# Patient Record
Sex: Male | Born: 1950
Health system: Southern US, Community
[De-identification: ages and names within clinical notes are randomized; demographics above are authoritative.]

## PROBLEM LIST (undated history)

## (undated) DIAGNOSIS — E785 Hyperlipidemia, unspecified: Secondary | ICD-10-CM

## (undated) DIAGNOSIS — H269 Unspecified cataract: Secondary | ICD-10-CM

## (undated) DIAGNOSIS — S43439A Superior glenoid labrum lesion of unspecified shoulder, initial encounter: Secondary | ICD-10-CM

## (undated) DIAGNOSIS — H9190 Unspecified hearing loss, unspecified ear: Secondary | ICD-10-CM

## (undated) DIAGNOSIS — T7840XA Allergy, unspecified, initial encounter: Secondary | ICD-10-CM

## (undated) DIAGNOSIS — R011 Cardiac murmur, unspecified: Secondary | ICD-10-CM

## (undated) DIAGNOSIS — M1990 Unspecified osteoarthritis, unspecified site: Secondary | ICD-10-CM

## (undated) DIAGNOSIS — K219 Gastro-esophageal reflux disease without esophagitis: Secondary | ICD-10-CM

## (undated) DIAGNOSIS — Z87442 Personal history of urinary calculi: Secondary | ICD-10-CM

## (undated) DIAGNOSIS — M48061 Spinal stenosis, lumbar region without neurogenic claudication: Secondary | ICD-10-CM

## (undated) HISTORY — DX: Unspecified osteoarthritis, unspecified site: M19.90

## (undated) HISTORY — DX: Superior glenoid labrum lesion of unspecified shoulder, initial encounter: S43.439A

## (undated) HISTORY — PX: MOUTH SURGERY: SHX715

## (undated) HISTORY — DX: Spinal stenosis, lumbar region without neurogenic claudication: M48.061

## (undated) HISTORY — DX: Allergy, unspecified, initial encounter: T78.40XA

## (undated) HISTORY — DX: Unspecified hearing loss, unspecified ear: H91.90

## (undated) HISTORY — PX: TONSILLECTOMY: SUR1361

## (undated) HISTORY — DX: Unspecified cataract: H26.9

## (undated) HISTORY — PX: SPINE SURGERY: SHX786

## (undated) HISTORY — DX: Hyperlipidemia, unspecified: E78.5

## (undated) HISTORY — PX: COLONOSCOPY: SHX174

## (undated) HISTORY — DX: Cardiac murmur, unspecified: R01.1

## (undated) HISTORY — PX: HERNIA REPAIR: SHX51

---

## 1981-09-01 HISTORY — PX: VASECTOMY: SHX75

## 1998-09-01 HISTORY — PX: EYE SURGERY: SHX253

## 2015-01-17 ENCOUNTER — Ambulatory Visit: Payer: Self-pay | Admitting: Sports Medicine

## 2015-01-24 ENCOUNTER — Encounter: Payer: Self-pay | Admitting: Sports Medicine

## 2015-01-24 ENCOUNTER — Ambulatory Visit (INDEPENDENT_AMBULATORY_CARE_PROVIDER_SITE_OTHER): Payer: BLUE CROSS/BLUE SHIELD | Admitting: Sports Medicine

## 2015-01-24 VITALS — BP 136/88 | HR 49 | Ht 75.0 in | Wt 196.0 lb

## 2015-01-24 DIAGNOSIS — M79671 Pain in right foot: Secondary | ICD-10-CM | POA: Diagnosis not present

## 2015-01-24 DIAGNOSIS — M6528 Calcific tendinitis, other site: Secondary | ICD-10-CM

## 2015-01-24 MED ORDER — NITROGLYCERIN 0.2 MG/HR TD PT24: MEDICATED_PATCH | TRANSDERMAL | Status: DC

## 2015-01-24 NOTE — Assessment & Plan Note (Signed)
See PT instructions  NTG protocol Easy rehab REC with soft heel counter shoes and I need to build sports insole with lift

## 2015-01-24 NOTE — Progress Notes (Signed)
Patient ID: Patrick Wall, male   DOB: 16-Feb-1951, 64 y.o.   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  Rt heel pain started years ago First episode treated wth ECSWT and improved  Experimented w shoes and some make it worse Tried treatment with EPADs but no help Does not recall specific injury  Has flared after longer runs No problem on bike or swim (x flip turns)  Hearing loss Hx No chronic ills  Exam Muscular mail w hearing aid BP 136/88 mmHg  Pulse 49  Ht 6\' 3"  (1.905 m)  Wt 196 lb (88.905 kg)  BMI 24.50 kg/m2  Excellent quad str Excellent hip flexion and abduction str Good alignment  Rt leg short by 1.5 cms  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

## 2015-01-24 NOTE — Patient Instructions (Signed)
You have Haglund's deformity of RT heel Rt leg is 1.5 cm shorter Your RT Achilles is swollen but that is not as severe as the swelling around the Haglund  You need a trial on NTG patches Nitroglycerin Protocol   Apply 1/4 nitroglycerin patch to affected area daily.  Change position of patch within the affected area every 24 hours.  You may experience a headache during the first 1-2 weeks of using the patch, these should subside.  If you experience headaches after beginning nitroglycerin patch treatment, you may take your preferred over the counter pain reliever.  Another side effect of the nitroglycerin patch is skin irritation or rash related to patch adhesive.  Please notify our office if you develop more severe headaches or rash, and stop the patch.  Tendon healing with nitroglycerin patch may require 12 to 24 weeks depending on the extent of injury.  Men should not use if taking Viagra, Cialis, or Levitra.   Do not use if you have migraines or rosacea.   Stop running and look for shoe with soft heel cup Do heel raises with both legs - 3 sets of 8 and build up to 3 sets of 15  See me when you have the new shoe for an insole to correct leg length issue

## 2015-02-21 ENCOUNTER — Ambulatory Visit: Payer: Self-pay | Admitting: Sports Medicine

## 2015-02-26 ENCOUNTER — Encounter: Payer: Self-pay | Admitting: Family Medicine

## 2015-02-26 DIAGNOSIS — H9190 Unspecified hearing loss, unspecified ear: Secondary | ICD-10-CM | POA: Insufficient documentation

## 2015-02-26 DIAGNOSIS — I10 Essential (primary) hypertension: Secondary | ICD-10-CM | POA: Insufficient documentation

## 2015-02-26 DIAGNOSIS — E785 Hyperlipidemia, unspecified: Secondary | ICD-10-CM | POA: Insufficient documentation

## 2015-03-20 ENCOUNTER — Ambulatory Visit (INDEPENDENT_AMBULATORY_CARE_PROVIDER_SITE_OTHER): Payer: BLUE CROSS/BLUE SHIELD | Admitting: Sports Medicine

## 2015-03-20 ENCOUNTER — Encounter: Payer: Self-pay | Admitting: Sports Medicine

## 2015-03-20 VITALS — BP 135/82 | HR 45 | Ht 75.0 in | Wt 196.0 lb

## 2015-03-20 DIAGNOSIS — M6528 Calcific tendinitis, other site: Secondary | ICD-10-CM

## 2015-03-20 NOTE — Progress Notes (Signed)
Subjective:     Patient ID: Patrick Wall, male   DOB: 1951-02-01, 64 y.o.   MRN: [id number]  HPI Patrick Wall is a 64 yo male presenting for follow up of achilles tendonitis. At last appointment 6 weeks ago, he was given rehab exercises, a sports orthotic with lift, and started on nitroglycerin protocol. His pain and swelling at his heel have improved, but he has also not been running in the past 6 weeks. He is typically very active and would like to get back to running.   Pain level now is mild - usually 0 at rest;  1 to 2 with normal activity.  He found good soft shoes which allow fit of his sports insoles.  NTG caused on side-effects and he had less pain p starting.    Review of Systems  Per HPI.     Objective:   Physical Exam Gen: NAD/ muscular male  BP 135/82 mmHg  Pulse 45  Ht 6\' 3"  (1.905 m)  Wt 196 lb (88.905 kg)  BMI 24.50 kg/m2   MSK: Right foot: Haglund deformity on medial right heel. No swelling noted. No tenderness to palpation. Good strength with inversion, eversion, able now to do a 1 leg heel raise on RT - he could not do any of these last visit.  No pain on 2 leg heel raise.  US There is less doppler flow;  AT at insertion is 0.68 and was 0.77; less hypoechoic change at bursa.  Calcifications at distal tendon and around Haglund as before.  AT looks more fibrillary at distal insertion.    Assessment:     Achilles Tendinopathy and Haglund RT    Plan:     We will cont current Tx plan and reck 6 weeks

## 2015-03-20 NOTE — Assessment & Plan Note (Signed)
Less pain and better strength clinically on exam  US looks improved as well  Cont NTG protocol for another 6 wks  Increase exercise intensity for AT  Reck 6 wks

## 2015-03-26 ENCOUNTER — Encounter: Payer: Self-pay | Admitting: Family Medicine

## 2015-03-26 ENCOUNTER — Ambulatory Visit: Payer: BLUE CROSS/BLUE SHIELD | Admitting: Family Medicine

## 2015-03-26 ENCOUNTER — Ambulatory Visit (INDEPENDENT_AMBULATORY_CARE_PROVIDER_SITE_OTHER): Payer: BLUE CROSS/BLUE SHIELD | Admitting: Family Medicine

## 2015-03-26 VITALS — BP 110/70 | HR 58 | Temp 98.0°F | Resp 12 | Ht 75.5 in | Wt 201.0 lb

## 2015-03-26 DIAGNOSIS — E785 Hyperlipidemia, unspecified: Secondary | ICD-10-CM

## 2015-03-26 DIAGNOSIS — S43439A Superior glenoid labrum lesion of unspecified shoulder, initial encounter: Secondary | ICD-10-CM | POA: Insufficient documentation

## 2015-03-26 NOTE — Progress Notes (Signed)
Subjective:    Patient ID: Patrick Wall, male    DOB: 11/27/1950, 64 y.o.   MRN: [id number]  HPI Patrick Wall is a very pleasant 64 year old Caucasian male here today to establish care. He has a history of hyperlipidemia. Patient is very active. He runs triathlons. His most recent lab work reveals an HDL cholesterol of 92 and an LDL cholesterol of 80. He denies any myalgias or right upper quadrant pain. Risk factors for heart disease include family history of premature cardiovascular disease in his paternal aunt and possibly his father. He also has age greater than 63. Given this his goal LDL cholesterol be less than 130. He has not tried stopping the medication to see if he can control his cholesterol without medication. Colonoscopy was performed 2 years ago. He is due for prostate exam in October. He has had Pneumovax 23, the shingles vaccine, and a tetanus shot. The remainder of his preventative care is up-to-date. Patient has a chronically elevated AST ranging between 41 and 43 that has not been worked up any further. This is likely due to personal variation from standard deviation or possibly muscle breakdown secondary to his vigorous exercise schedule. At the present time the patient is not interested in performing viral hepatitis screening or right upper quadrant ultrasound which I believe is appropriate. He also has a slightly elevated blood sugar between 100-103. Past Medical History  Diagnosis Date  . Hyperlipidemia   . Hearing loss   . Labral tear of shoulder    Past Surgical History  Procedure Laterality Date  . Eye surgery  64      lasic  . Vasectomy  1983   Current Outpatient Prescriptions on File Prior to Visit  Medication Sig Dispense Refill  . aspirin 81 MG tablet Take 81 mg by mouth daily.    Marland Kitchen atorvastatin (LIPITOR) 40 MG tablet Take 40 mg by mouth daily.    . cholecalciferol (VITAMIN D) 1000 UNITS tablet Take 1,000 Units by mouth daily.    . Multiple Vitamin (MULTIVITAMIN)  capsule Take 1 capsule by mouth daily.    . nitroGLYCERIN (NITRODUR - DOSED IN MG/24 HR) 0.2 mg/hr patch Place 1/4 patch on affected area daily 30 patch 1   No current facility-administered medications on file prior to visit.   Allergies  Allergen Reactions  . Sulfa Antibiotics    History   Social History  . Marital Status: Married    Spouse Name: N/A  . Number of Children: N/A  . Years of Education: N/A   Occupational History  . Not on file.   Social History Main Topics  . Smoking status: Never Smoker   . Smokeless tobacco: Not on file  . Alcohol Use: 5.4 oz/week    5 Glasses of wine, 4 Cans of beer, 0 Standard drinks or equivalent per week  . Drug Use: No  . Sexual Activity: Yes    Birth Control/ Protection: Surgical     Comment: vasectomy   Other Topics Concern  . Not on file   Social History Narrative   Family History  Problem Relation Age of Onset  . Cancer Mother     skin  . Heart disease Mother   . Vision loss Mother   . Early death Father   . Arthritis Maternal Grandmother   . Cancer Maternal Grandmother   . Hyperlipidemia Maternal Grandmother   . Hypertension Maternal Grandmother   . Cancer Maternal Grandfather   . Heart disease Paternal Grandfather   . Stroke  Paternal Grandfather       Review of Systems  All other systems reviewed and are negative.      Objective:   Physical Exam  Constitutional: He appears well-developed and well-nourished.  Neck: Neck supple. No JVD present. No thyromegaly present.  Cardiovascular: Normal rate, regular rhythm, normal heart sounds and intact distal pulses.  Exam reveals no gallop and no friction rub.   No murmur heard. Pulmonary/Chest: Effort normal and breath sounds normal. No respiratory distress. He has no wheezes. He has no rales. He exhibits no tenderness.  Abdominal: Soft. Bowel sounds are normal. He exhibits no distension and no mass. There is no tenderness. There is no rebound and no guarding.    Musculoskeletal: He exhibits no edema.  Lymphadenopathy:    He has no cervical adenopathy.  Skin: No rash noted.  Vitals reviewed.         Assessment & Plan:  HLD (hyperlipidemia)  Patient has significant bradycardia but this is related to his vigorous aerobic exercise regimen. The remainder of his physical exam is normal. I would like the patient to temporarily discontinue Lipitor and in 3 months check a fasting lipid panel at his complete physical exam. His LDL cholesterol remains less than 130, I would not continue statin medication. If LDL cholesterol rises greater than 130 I would resume statin medication. The remainder of his preventative care is up-to-date at the present time

## 2015-05-01 ENCOUNTER — Encounter: Payer: Self-pay | Admitting: Sports Medicine

## 2015-05-01 ENCOUNTER — Ambulatory Visit (INDEPENDENT_AMBULATORY_CARE_PROVIDER_SITE_OTHER): Payer: BLUE CROSS/BLUE SHIELD | Admitting: Sports Medicine

## 2015-05-01 VITALS — BP 133/80 | Ht 75.0 in | Wt 196.0 lb

## 2015-05-01 DIAGNOSIS — M6528 Calcific tendinitis, other site: Secondary | ICD-10-CM | POA: Diagnosis not present

## 2015-05-01 NOTE — Progress Notes (Signed)
  Patrick Wall - 64 y.o. male MRN [id number]  Date of birth: [birth year]   Patrick Wall is a 64 y.o. male who presents today for calcific right achilles. He has been receiving NTG patches for 12 weeks. He has been given a wedge heel lift, compression sleeve, and sports orthotic. He has Haglund's changes.  He has been using 1/4 nitro patch but will increase it to a 1/2 after racing or exercise. He has noticed improvement since when he first started receiving care but not much change since July.  He has taken back his training since being seen for his achilles pain. On Sunday he raced in a 10K at 10 minute miles. He denies any pain during the race and felt a discomfort afterwards. He would like to start training for a 1/2 Iron[employer] that starts in 7 weeks.  He has been biking and swimming still.  He denies any prior injury or surgery to his right foot.     PMHx -  reviewed.  Contributory factors include: HLD, HTN, hearing loss PSHx - reviewed.   FHx - reviewed.   Medications - NTG patches, lipitor   ROS Per HPI   Exam:  Filed Vitals:   05/01/15 1535  BP: 133/80   Gen: NAD Cardiorespiratory - Normal respiratory effort/rate.   Foot Exam:  Laterality: right Appearance: no erythema or ecchymosis  Haglund deformity on medial right heel Nitro patch in place  Tenderness: none Range of Motion: normal active and passive  Neurovascularly intact: intact Strength:  Dorsiflexion: 5/5 Plantarflexion: 5/5 Inversion: 5/5 Eversion: 5/5   Imaging:  Limites [place]: right achilles   Achilles without tear or effusion with is an improvement from the scan in May but not much change from the scan in July. There is still neovascularization at the medial Haglund deformity and just proximal to the insertion of the achilles. These findings were observed on the scan in July.  Finding consistent with a healing calcific achilles tendon.   Calcific Achilles tendinitis Pain improved since May but not much change since July  in terms of US findings or discomfort.  Have to be optimistic as he isn't having much pain since running a 10K on Sunday  - will continue conservative management with nitro protocol  - he will start 1 run (~3 miles) over the next 2-3 weeks.  - he will increase to 2 runs per week 2-3 weeks prior to his Ironman  - he will f/u in three months, if there is no improvement then could consider pursing surgery

## 2015-05-01 NOTE — Assessment & Plan Note (Addendum)
Pain improved since May but not much change since July in terms of US findings or discomfort.  Have to be optimistic as he isn't having much pain since running a 10K on Sunday  - will continue conservative management with nitro protocol  - he will start 1 run (~3 miles) over the next 2-3 weeks.  - he will increase to 2 runs per week 2-3 weeks prior to his [employer]  - he will f/u in three months, if there is no improvement then could consider pursing surgery

## 2015-05-21 ENCOUNTER — Other Ambulatory Visit: Payer: Self-pay | Admitting: *Deleted

## 2015-05-21 MED ORDER — NITROGLYCERIN 0.2 MG/HR TD PT24: MEDICATED_PATCH | TRANSDERMAL | Status: DC

## 2015-06-26 ENCOUNTER — Other Ambulatory Visit: Payer: Self-pay | Admitting: Family Medicine

## 2015-06-26 ENCOUNTER — Other Ambulatory Visit: Payer: BLUE CROSS/BLUE SHIELD

## 2015-06-26 ENCOUNTER — Encounter: Payer: Self-pay | Admitting: Family Medicine

## 2015-06-26 ENCOUNTER — Ambulatory Visit (INDEPENDENT_AMBULATORY_CARE_PROVIDER_SITE_OTHER): Payer: BLUE CROSS/BLUE SHIELD | Admitting: Family Medicine

## 2015-06-26 VITALS — BP 100/68 | HR 56 | Temp 97.5°F | Resp 14 | Ht 75.0 in | Wt 205.0 lb

## 2015-06-26 DIAGNOSIS — E785 Hyperlipidemia, unspecified: Secondary | ICD-10-CM | POA: Diagnosis not present

## 2015-06-26 DIAGNOSIS — Z0000 Encounter for general adult medical examination without abnormal findings: Secondary | ICD-10-CM

## 2015-06-26 DIAGNOSIS — I10 Essential (primary) hypertension: Secondary | ICD-10-CM

## 2015-06-26 LAB — COMPLETE METABOLIC PANEL WITH GFR
ALBUMIN: 3.9 g/dL (ref 3.6–5.1)
ALK PHOS: 35 U/L — AB (ref 40–115)
ALT: 31 U/L (ref 9–46)
AST: 50 U/L — AB (ref 10–35)
BILIRUBIN TOTAL: 0.9 mg/dL (ref 0.2–1.2)
BUN: 15 mg/dL (ref 7–25)
CALCIUM: 9.1 mg/dL (ref 8.6–10.3)
CO2: 30 mmol/L (ref 20–31)
CREATININE: 0.97 mg/dL (ref 0.70–1.25)
Chloride: 104 mmol/L (ref 98–110)
GFR, Est African American: >89 mL/min (ref >=60)
GFR, Est Non African American: 83 mL/min (ref >=60)
GLUCOSE: 100 mg/dL — AB (ref 70–99)
Potassium: 4.5 mmol/L (ref 3.5–5.3)
Sodium: 139 mmol/L (ref 135–146)
TOTAL PROTEIN: 6.4 g/dL (ref 6.1–8.1)

## 2015-06-26 LAB — LIPID PANEL
CHOL/HDL RATIO: 2.2 ratio (ref <=5.0)
CHOLESTEROL: 196 mg/dL (ref 125–200)
HDL: 91 mg/dL (ref >=40)
LDL Cholesterol: 93 mg/dL (ref <130)
Triglycerides: 60 mg/dL (ref <150)
VLDL: 12 mg/dL (ref <30)

## 2015-06-26 LAB — CBC WITH DIFFERENTIAL/PLATELET
Basophils Absolute: 0 10*3/uL (ref 0.0–0.1)
Basophils Relative: 0 % (ref 0–1)
Eosinophils Absolute: 0.1 10*3/uL (ref 0.0–0.7)
Eosinophils Relative: 3 % (ref 0–5)
HEMATOCRIT: 41.4 % (ref 39.0–52.0)
HEMOGLOBIN: 14 g/dL (ref 13.0–17.0)
LYMPHS PCT: 44 % (ref 12–46)
Lymphs Abs: 1.2 10*3/uL (ref 0.7–4.0)
MCH: 31 pg (ref 26.0–34.0)
MCHC: 33.8 g/dL (ref 30.0–36.0)
MCV: 91.6 fL (ref 78.0–100.0)
MONO ABS: 0.4 10*3/uL (ref 0.1–1.0)
MONOS PCT: 13 % — AB (ref 3–12)
MPV: 10.8 fL (ref 8.6–12.4)
NEUTROS ABS: 1.1 10*3/uL — AB (ref 1.7–7.7)
Neutrophils Relative %: 40 % — ABNORMAL LOW (ref 43–77)
Platelets: 155 10*3/uL (ref 150–400)
RBC: 4.52 MIL/uL (ref 4.22–5.81)
RDW: 13.8 % (ref 11.5–15.5)
WBC: 2.7 10*3/uL — ABNORMAL LOW (ref 4.0–10.5)

## 2015-06-26 NOTE — Progress Notes (Signed)
Subjective:    Patient ID: Patrick Wall, male    DOB: 06-04-1951, 64 y.o.   MRN: [id number]  HPI 03/26/15 Patrick Wall is a very pleasant 64 year old Caucasian male here today to establish care. He has a history of hyperlipidemia. Patient is very active. He runs triathlons. His most recent lab work reveals an HDL cholesterol of 92 and an LDL cholesterol of 80. He denies any myalgias or right upper quadrant pain. Risk factors for heart disease include family history of premature cardiovascular disease in his paternal aunt and possibly his father. He also has age greater than 3345. Given this his goal LDL cholesterol be less than 130. He has not tried stopping the medication to see if he can control his cholesterol without medication. Colonoscopy was performed 2 years ago. He is due for prostate exam in October. He has had Pneumovax 23, the shingles vaccine, and a tetanus shot. The remainder of his preventative care is up-to-date. Patient has a chronically elevated AST ranging between 41 and 43 that has not been worked up any further. This is likely due to personal variation from standard deviation or possibly muscle breakdown secondary to his vigorous exercise schedule. At the present time the patient is not interested in performing viral hepatitis screening or right upper quadrant ultrasound which I believe is appropriate. He also has a slightly elevated blood sugar between 100-103.  AT that time, my plan was: Patient has significant bradycardia but this is related to his vigorous aerobic exercise regimen. The remainder of his physical exam is normal. I would like the patient to temporarily discontinue Lipitor and in 3 months check a fasting lipid panel at his complete physical exam. His LDL cholesterol remains less than 130, I would not continue statin medication. If LDL cholesterol rises greater than 130 I would resume statin medication. The remainder of his preventative care is up-to-date at the present  time.  06/26/15 Here for follow up.  Last colonoscopy was in 2014 and is not due again until 2024. He is due for prostate exam today. Flu shot is up-to-date. Patient is not due for Pneumovax 23 again until 2018.  He will be due for Prevnar 13 at age 64. Shingles vaccines up-to-date. Tetanus vaccines up-to-date. Patient had lab work obtained this morning but is not back yet Past Medical History  Diagnosis Date  . Hyperlipidemia   . Hearing loss   . Labral tear of shoulder    Past Surgical History  Procedure Laterality Date  . Eye surgery  2000    lasic  . Vasectomy  1983   Current Outpatient Prescriptions on File Prior to Visit  Medication Sig Dispense Refill  . aspirin 81 MG tablet Take 81 mg by mouth daily.    Marland Kitchen. atorvastatin (LIPITOR) 40 MG tablet Take 40 mg by mouth daily.    . cholecalciferol (VITAMIN D) 1000 UNITS tablet Take 1,000 Units by mouth daily.    . Multiple Vitamin (MULTIVITAMIN) capsule Take 1 capsule by mouth daily.    . nitroGLYCERIN (NITRODUR - DOSED IN MG/24 HR) 0.2 mg/hr patch Place 1/4 to 1/2 patch on affected area daily 30 patch 2  . penicillin v potassium (VEETID) 500 MG tablet   0   No current facility-administered medications on file prior to visit.   Allergies  Allergen Reactions  . Sulfa Antibiotics    Social History   Social History  . Marital Status: Married    Spouse Name: N/A  . Number of Children: N/A  .  Years of Education: N/A   Occupational History  . Not on file.   Social History Main Topics  . Smoking status: Never Smoker   . Smokeless tobacco: Not on file  . Alcohol Use: 5.4 oz/week    5 Glasses of wine, 4 Cans of beer, 0 Standard drinks or equivalent per week  . Drug Use: No  . Sexual Activity: Yes    Birth Control/ Protection: Surgical     Comment: vasectomy   Other Topics Concern  . Not on file   Social History Narrative   Family History  Problem Relation Age of Onset  . Cancer Mother     skin  . Heart disease Mother    . Vision loss Mother   . Early death Father   . Arthritis Maternal Grandmother   . Cancer Maternal Grandmother   . Hyperlipidemia Maternal Grandmother   . Hypertension Maternal Grandmother   . Cancer Maternal Grandfather   . Heart disease Paternal Grandfather   . Stroke Paternal Grandfather       Review of Systems  All other systems reviewed and are negative.      Objective:   Physical Exam  Constitutional: He is oriented to person, place, and time. He appears well-developed and well-nourished. No distress.  HENT:  Head: Normocephalic and atraumatic.  Right Ear: External ear normal.  Left Ear: External ear normal.  Nose: Nose normal.  Mouth/Throat: Oropharynx is clear and moist. No oropharyngeal exudate.  Eyes: Conjunctivae and EOM are normal. Pupils are equal, round, and reactive to light. Right eye exhibits no discharge. Left eye exhibits no discharge. No scleral icterus.  Neck: Normal range of motion. Neck supple. No JVD present. No tracheal deviation present. No thyromegaly present.  Cardiovascular: Normal rate, regular rhythm, normal heart sounds and intact distal pulses.  Exam reveals no gallop and no friction rub.   No murmur heard. Pulmonary/Chest: Effort normal and breath sounds normal. No respiratory distress. He has no wheezes. He has no rales. He exhibits no tenderness.  Abdominal: Soft. Bowel sounds are normal. He exhibits no distension and no mass. There is no tenderness. There is no rebound and no guarding.  Genitourinary: Rectum normal, prostate normal and penis normal. Guaiac negative stool. No penile tenderness.  Musculoskeletal: He exhibits no edema.  Lymphadenopathy:    He has no cervical adenopathy.  Neurological: He is alert and oriented to person, place, and time. He displays normal reflexes. No cranial nerve deficit. He exhibits normal muscle tone. Coordination normal.  Skin: Skin is warm. No rash noted. He is not diaphoretic. No erythema. No pallor.   Psychiatric: He has a normal mood and affect. His behavior is normal. Judgment and thought content normal.  Vitals reviewed.         Assessment & Plan:  HLD (hyperlipidemia) - Plan: COMPLETE METABOLIC PANEL WITH GFR, Lipid panel  Routine general medical examination at a health care facility - Plan: CBC with Differential/Platelet, COMPLETE METABOLIC PANEL WITH GFR, Lipid panel, PSA  Physical exam today is completely normal. I will obtain a CBC, CMP, fasting lipid panel, and a PSA. Cancer screening is up-to-date. Immunizations are up-to-date. If LDL cholesterol rises greater than 160, I would resume a statin medication. However if LDL cholesterol is less than 160, given his elevated HDL cholesterol and his excellent physical conditioning, I would not resume a statin medication

## 2015-06-27 ENCOUNTER — Encounter: Payer: Self-pay | Admitting: Family Medicine

## 2015-06-29 ENCOUNTER — Encounter: Payer: Self-pay | Admitting: Family Medicine

## 2015-06-29 LAB — PSA: PSA: 0.61 ng/mL (ref <=4.00)

## 2015-07-17 ENCOUNTER — Ambulatory Visit: Payer: BLUE CROSS/BLUE SHIELD | Admitting: Sports Medicine

## 2015-09-10 MED FILL — NITROGLYCERIN 0.2 MG/HR PTC: 0.2 | 60 days supply | Qty: 30 | Fill #1

## 2015-09-18 MED FILL — PENICILLIN VK 500 MG TABLET: 500 | 6 days supply | Qty: 26 | Fill #0

## 2015-10-16 ENCOUNTER — Encounter: Payer: Self-pay | Admitting: Sports Medicine

## 2015-10-16 ENCOUNTER — Ambulatory Visit (INDEPENDENT_AMBULATORY_CARE_PROVIDER_SITE_OTHER): Payer: BLUE CROSS/BLUE SHIELD | Admitting: Sports Medicine

## 2015-10-16 VITALS — BP 138/91 | HR 42 | Ht 75.0 in | Wt 198.0 lb

## 2015-10-16 DIAGNOSIS — M6528 Calcific tendinitis, other site: Secondary | ICD-10-CM | POA: Diagnosis not present

## 2015-10-16 NOTE — Patient Instructions (Signed)
Achilles strengthening exercises: 1.) Stair climbing - get knees high while climbing stairs. Use weights as tolerated to increase calf strength. Make sure to use calves to push off  2.) Long lunges with weights (up to 60 pounds)  3.) Rapid 1/2 squat - start up on toes, then quickly bend to 45 degrees. Use more weight as tolerated  Continue using nitro patch, but decrease to every other day.   Can start running 3-4 miles mid-week  Follow up in 3 months

## 2015-10-16 NOTE — Progress Notes (Signed)
  Patrick Wall - 65 y.o. male MRN [id number]  Date of birth: 1950-11-22  SUBJECTIVE:  Including CC & ROS.  No chief complaint on file. CC: R Achilles pain  HPI: Patient presents for follow up of calcific R achilles tendon. LOV in Aug 2016. Has been continuing nitro patch, wearing green orthotics, performing calf strengthening exercises, and running 1x weekly. Feels that his pain has improved and is hoping to run more during the week as he begins to train for triathlon season. Runs 5-6 miles on weekends and does not experience pain while running. The next day, he usually has achilles soreness near the calcaneous. Reports occasional stiffness with dorsiflexion. Denies radiation of pain, numbness, or tingling of the foot. Wears [employer] while running. Uses 1/2 nitropatch on days after he runs and 1/4 patch all other days.  Approx 3-4 weeks ago, patient noticed increased soreness over the plantar aspect of his left foot. Tried to massage the foot, but this did not provide relief. When he examined his foot, he saw significant bruising throughout the arch. Pain resolved within 2-3 days. Has not returned since that time.   HISTORY: Past Medical, Surgical, Social, and Family History Reviewed & Updated per EMR.   Pertinent Historical Findings include: Stays active by training for triathlons History of plantar fasciitis Is a non-smoker  ROS No generalized joint pain/ no weakness/ no numbness or tingling  DATA REVIEWED: [place] from 04/2015 office visit  PHYSICAL EXAM:  VS: BP:(!) 138/91 mmHg  HR:(!) 42bpm  TEMP: ( )  RESP:   HT:6\' 3"  (190.5 cm)   WT:198 lb (89.812 kg)  BMI:24.8 PHYSICAL EXAM: Gen: Well-appearing Caucasian man in NAD HEENT: normocephalic, atraumatic, EOMI Pulm: non-labored respirations  R foot: Haglund's deformity of the calcaneous. No erythema or soft tissue swelling. No tenderness to palpation over the Achilles, medial malleolus, lateral malleolus, forefoot, midfoot,  or calcaneous. Full ROM with dorsiflexion, plantarflexion, inversion, and eversion 5/5 strength with dorsiflexion, plantarflexion, and great toe extension Neurovascularly intact  [place] R Achilles: Haglund's deformity noted. Less edema and blood flow noted in surrounding tissues when compared to previous scan. Calcification at insertion of the Achilles.   ASSESSMENT & PLAN: See problem based charting & AVS for pt instructions. 1.) R Achilles tendinopathy - Patient improving clinically and evidence of healing on [place] as well. Discussed with patient achilles and calf strengthening exercises. Wears [employer] while running - advised patient to find another shoe that he can tolerate in the heel that provides increased Achilles support. Will start running once in the middle of the week and continue his longer runs on the weekend. - Calf and Achilles strengthening exercises - Reduce nitro patches to every other day/ wean off if OK - Can increase activity level as tolerated - Stop wearing [employer] - follow up in 6 weeks

## 2015-10-16 NOTE — Assessment & Plan Note (Signed)
Much improved functionally No pain on palpation or squeeze  See OV as we will gradually increase his running

## 2015-11-01 MED FILL — PENICILLIN VK 500 MG TABLET: 500 | 7 days supply | Qty: 26 | Fill #0

## 2015-11-16 MED FILL — CLINDAMYCIN HCL 150 MG CAPS: 150 | 1 days supply | Qty: 4 | Fill #0

## 2016-01-29 MED FILL — HYDROCODON-APAP 10-325: 10-325 | 5 days supply | Qty: 30 | Fill #0

## 2016-01-29 MED FILL — AMOXICILLIN 500 MG CAPSULE: 500 | 10 days supply | Qty: 30 | Fill #0

## 2016-02-05 ENCOUNTER — Ambulatory Visit: Payer: BLUE CROSS/BLUE SHIELD | Admitting: Sports Medicine

## 2016-02-20 ENCOUNTER — Ambulatory Visit (INDEPENDENT_AMBULATORY_CARE_PROVIDER_SITE_OTHER): Payer: BLUE CROSS/BLUE SHIELD | Admitting: Sports Medicine

## 2016-02-20 ENCOUNTER — Encounter: Payer: Self-pay | Admitting: Sports Medicine

## 2016-02-20 VITALS — BP 116/75 | Ht 75.0 in | Wt 198.0 lb

## 2016-02-20 DIAGNOSIS — M6528 Calcific tendinitis, other site: Secondary | ICD-10-CM | POA: Diagnosis not present

## 2016-02-20 NOTE — Patient Instructions (Signed)
Continue Achilles strengthening exercises for maintenance: 1.) Stair climbing - get knees high while climbing stairs. Use weights as tolerated to increase calf strength. Make sure to use calves to push off  2.) Long lunges with weights (up to 60 pounds)  3.) Rapid 1/2 squat - start up on toes, then quickly bend to 45 degrees. Use more weight as tolerated   Taper off the nitroglycerin patch.   Continue to use higher heel to toe drop shoes.   Continue to slowly increase her weekly running volume and intensity.   Follow up as needed

## 2016-02-20 NOTE — Progress Notes (Signed)
  Patrick Wall - 65 y.o. male MRN [id number]  Date of birth: May 24, 1951  SUBJECTIVE:  Including CC & ROS.  No chief complaint on file. CC: R Achilles pain  HPI: Patient presents for follow up of calcific R achilles tendon. He was last seen on 10/16/2015   He is now wearing Nike Tempo shoes instead of the frees which have an 8 mm drop rather than the 4 mm drop. These seem to be working better. He also wears a heel cup on the right. He did purchase a new Sales executivelyknit racer also has an 8 mm drop which she plans to try out in the next few weeks. [place] He is now running 2 times a week. He has had animal if any pain overall. He did have a tweal roughly 4 days ago, this is mild and self-limited.Has been continuing nitro patch, now only after runs. Performing calf strengthening exercises at times.    Overall he feels like he is doing well and ready to progress to further exercises.   HISTORY: Past Medical, Surgical, Social, and Family History Reviewed & Updated per EMR.   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 No erythema or soft tissue swelling. No tenderness to palpation over the Achilles, medial malleolus, lateral malleolus, forefoot, midfoot, or calcaneous. Full ROM with dorsiflexion, plantarflexion, inversion, and eversion 5/5 strength with dorsiflexion, plantarflexion, and great toe extension Neurovascularly intact  US R Achilles: Haglund's  deformity noted. No edema nor new vessels noted in surrounding tissues when compared to previous scan. Calcification at insertion of the Achilles insertion. Insertional depth of the Achilles is 0.52 mm improved from 0.6 at the last visit.   ASSESSMENT & PLAN: See problem based charting & AVS for pt instructions. 1.) R Achilles insertional tendinopathy - patient doing well with higher heel to toe drop shoes, occasional nitroglycerin, strengthening. He is up to running 2 times a week. He is now wearing Nike Tempo and Flyknit shoes which both have 8mm drops. We discussed some of the other brands with higher drops but he is unable to wear these due to a firm heel cup.  - Continue Calf and Achilles strengthening exercises for maintenance - Continue using nitroglycerin patches as needed. Okay to use only after long runs although the actual benefit is unknown. - Continue to slowly increase running volume though he does not wish to run too much more. - Continue wearing higher heel to toe drop shoes (>568mm) with a soft heel cup. - follow up as needed.

## 2016-02-29 MED FILL — AMOXICILLIN 500 MG CAPSULE: 500 | 10 days supply | Qty: 30 | Fill #0

## 2016-03-18 ENCOUNTER — Ambulatory Visit
Admission: RE | Admit: 2016-03-18 | Discharge: 2016-03-18 | Disposition: A | Discharge status: Home / Self Care | Payer: BLUE CROSS/BLUE SHIELD | Source: Ambulatory Visit | Attending: Sports Medicine | Admitting: Sports Medicine

## 2016-03-18 ENCOUNTER — Ambulatory Visit (INDEPENDENT_AMBULATORY_CARE_PROVIDER_SITE_OTHER): Payer: BLUE CROSS/BLUE SHIELD | Admitting: Family Medicine

## 2016-03-18 ENCOUNTER — Encounter: Payer: Self-pay | Admitting: Family Medicine

## 2016-03-18 VITALS — BP 147/90 | Ht 75.0 in | Wt 196.0 lb

## 2016-03-18 DIAGNOSIS — M79672 Pain in left foot: Secondary | ICD-10-CM | POA: Diagnosis not present

## 2016-03-18 DIAGNOSIS — M7732 Calcaneal spur, left foot: Secondary | ICD-10-CM | POA: Diagnosis not present

## 2016-03-18 NOTE — Progress Notes (Signed)
  Patrick Wall - 65 y.o. male MRN [id number]  Date of birth: [birth year]  SUBJECTIVE:  Including CC & ROS.  Chief Complaint  Patient presents with  . Foot Pain     Left foot pain:  The pain is exacerbated today.  He initially had pain about 1.5 weeks ago after running.  He denies any acute injury or prior history of injury to his foot.  He locates the pain on the lateral aspect of his lateral forefoot.  Denies any ecchymosis when it occurred but it felt swollen.  He felt the pain was improving until today and may be associated with the shoes that he is wearing today.  He ran the past few days without pain.  Reports the pain today as 5/10 and achy in nature.  Denies any medications.  Pain occurs with bearing weight.   ROS: No unexpected weight loss, fever, chills, swelling, instability, muscle pain, numbness/tingling, redness, otherwise see HPI    HISTORY: Past Medical, Surgical, Social, and Family History Reviewed & Updated per EMR.   Pertinent Historical Findings include: PMSHx -  HLD, HTN, hearing loss PSHx -  Works in the [employer]  DATA REVIEWED: None to review   PHYSICAL EXAM:  VS: BP:(!) 147/90 mmHg  HR: bpm  TEMP: ( )  RESP:   HT:6\' 3"  (190.5 cm)   WT:196 lb (88.905 kg)  BMI:24.5 PHYSICAL EXAM: Gen: NAD, alert, cooperative with exam, well-appearing HEENT: clear conjunctiva,  CV:  no edema, capillary refill brisk, Resp: non-labored Skin: no rashes, normal turgor  Neuro: no gross deficits.  Psych:  alert and oriented Left foot: No visible swelling, ecchymosis, erythema, ulcers, blister Callus formation at the head of the 5th MT on plantar aspect on bilateral feet.  Haglund's deformity on right foot.  Pain to palpation over the cuboid, lateral cuneiform and head of 4th MT No tenderness over N spot or navicular prominence No tenderness on lateral and medial malleolus No sign of peroneal tendon subluxations or tenderness to palpation Full in plantarflexion,  dorsiflexion, inversion, and eversion of the foot; flexion and extension of the toes Strength: 5/5 in all directions. Sensation: intact Vascular: intact w/ dorsalis pedis & posterior tibialis pulses 2+ Stable lateral and medial ligaments; Negative Anterior drawer test Negative tarsal tunnel tinel's Able to walk 4 steps    ASSESSMENT & PLAN:   Left foot pain US of the area that was point tender did not reveal any suggestion of fracture in the area.  Peroneal brevis intact and no pain with palpation of 5th MT Possible for foot sprain (dislocation of cuboid and strain of the cuneocuboid ligament) as he appears to be a supinator with the callus formation on the head of the 5th MT  - ordered foot x-ray to rule out fracture - arch strap for compression - avoid wearing shoes that exacerbate pain  - try cross training  - f/u in 1-2 weeks to monitor for improvement. If no improvement, evaluate gait and could consider injection

## 2016-03-18 NOTE — Assessment & Plan Note (Signed)
US of the area that was point tender did not reveal any suggestion of fracture in the area.  Peroneal brevis intact and no pain with palpation of 5th MT Possible for foot sprain (dislocation of cuboid and strain of the cuneocuboid ligament) as he appears to be a supinator with the callus formation on the head of the 5th MT  - ordered foot x-ray to rule out fracture - arch strap for compression - avoid wearing shoes that exacerbate pain  - try cross training  - f/u in 1-2 weeks to monitor for improvement. If no improvement, evaluate gait and could consider injection

## 2016-03-18 NOTE — Patient Instructions (Signed)
Thank you for coming in,   Please try wearing different shoes and see how that does.   Please follow up with us in 1-2 weeks if you are still having pain.    Please feel free to call with any questions or concerns at any time, at [phone]. --Dr. Jordan LikesSchmitz

## 2016-03-20 ENCOUNTER — Telehealth: Payer: Self-pay | Admitting: Family Medicine

## 2016-03-20 NOTE — Telephone Encounter (Signed)
Spoke with patient about his x-rays. He is still having ongoing pain in that area. Suggested that he could try a topical cream to help with his pain such as capsaicin cream. He has a follow up scheduled for next week.   Myra RudeJeremy E Schmitz, MD PGY-4, [facility] 03/20/2016, 9:22 AM

## 2016-03-25 ENCOUNTER — Encounter: Payer: Self-pay | Admitting: Family Medicine

## 2016-03-25 ENCOUNTER — Ambulatory Visit (INDEPENDENT_AMBULATORY_CARE_PROVIDER_SITE_OTHER): Payer: BLUE CROSS/BLUE SHIELD | Admitting: Family Medicine

## 2016-03-25 VITALS — BP 146/79 | Ht 75.0 in | Wt 196.0 lb

## 2016-03-25 DIAGNOSIS — M79672 Pain in left foot: Secondary | ICD-10-CM

## 2016-03-25 NOTE — Patient Instructions (Addendum)
Thank you for coming in,   I will call with the results from today.    Please feel free to call with any questions or concerns at any time, at [phone]. --Dr. Jordan Likes

## 2016-03-25 NOTE — Progress Notes (Signed)
  Patrick Wall - 65 y.o. male MRN [id number]  Date of birth: 13-Nov-1950  SUBJECTIVE:  Including CC & ROS.  Chief Complaint  Patient presents with  . Foot Pain   Patrick Wall is a 65 year old man that is following up for his left foot pain. He was seen on 7/18/2 of 17 for this dorsal foot pain. The pain is occurring after he was running about 3 weeks ago. The pain is still occurring despite taking time off from running. He is moaning to compete in Iron[employer] contest on August 12. He has not run for the past week at all. He noticed his foot swollen this morning and like his foot did not fit into his shoe. He has not taken any medications for this. Denies any numbness or tingling.   ROS: No unexpected weight loss, feer, chills, swelling, instability, muscle pain, numbness/tingling, redness, otherwise see HPI   HISTORY: Past Medical, Surgical, Social, and Family History Reviewed & Updated per EMR.   Pertinent Historical Findings include: PMSHx -  hyperlipidemia, hypertension, hearing loss PSHx -  works in the [employer]  DATA REVIEWED: X-ray left foot: 7/18/0 17: Findings were suggestive of a small calcaneal spur but no apparent fracture or arthropathy.  PHYSICAL EXAM:  VS: BP:(!) 146/79  HR: bpm  TEMP: ( )  RESP:   HT:6\' 3"  (190.5 cm)   WT:196 lb (88.9 kg)  BMI:24.5 PHYSICAL EXAM: Gen: NAD, alert, cooperative with exam, well-appearing HEENT: clear conjunctiva,  CV:  no edema, capillary refill brisk, normal rate Resp: non-labored Skin: no rashes, normal turgor  Neuro: no gross deficits.  Psych:  alert and oriented Ankle & Foot: Left foot is visibly swollen compared to right Haglund deformity on right Pain to palpation over the distal third metatarsal near the base Pain to palpation over the cuboid and lateral cuneiform No sign of peroneal tendon subluxations or tenderness to palpation Full in plantarflexion, dorsiflexion, inversion, and eversion of the foot; flexion and extension of the  toes Strength: 5/5 in all directions. Sensation: intact Vascular: intact w/ dorsalis pedis & posterior tibialis pulses 2+ Stable lateral and medial ligaments; Negative Anterior drawer test Able to walk 4 steps without limping  He did not demonstrate any overpronation during ambulation  Limited ultrasound: Left foot: Obvious hypoechoic changes on the dorsal aspect of his foot that is consistent with the swelling observed. Scanning of the third and fourth metatarsal revealed no step-off, hematoma, or callous formation. Evaluation of the joint between cuneiform and cuboid under ultrasound did not reveal any hypoechoic changes to suggest separation.   ASSESSMENT & PLAN:   Left foot pain Continues to have foot pain despite nothing apparently wrong on ultrasound. It is acting like a stress fracture despite a normal x-ray. - Placed in postop shoe today. Size large. - Order MRI of left foot including midfoot and forefoot. I will call with results. - Pending on the results of the MRI will dictate further treatment. He would like to participate in the Iron[employer] that is coming up in August.

## 2016-03-25 NOTE — Assessment & Plan Note (Signed)
Continues to have foot pain despite nothing apparently wrong on ultrasound. It is acting like a stress fracture despite a normal x-ray. - Placed in postop shoe today. Size large. - Order MRI of left foot including midfoot and forefoot. I will call with results. - Pending on the results of the MRI will dictate further treatment. He would like to participate in the [employer] that is coming up in August.

## 2016-03-29 ENCOUNTER — Ambulatory Visit (HOSPITAL_COMMUNITY): Admission: RE | Admit: 2016-03-29 | Payer: BLUE CROSS/BLUE SHIELD | Source: Ambulatory Visit

## 2016-03-29 ENCOUNTER — Inpatient Hospital Stay (HOSPITAL_COMMUNITY): Admission: RE | Admit: 2016-03-29 | Payer: BLUE CROSS/BLUE SHIELD | Source: Ambulatory Visit

## 2016-03-30 ENCOUNTER — Inpatient Hospital Stay (HOSPITAL_COMMUNITY): Admission: RE | Admit: 2016-03-30 | Payer: BLUE CROSS/BLUE SHIELD | Source: Ambulatory Visit

## 2016-03-30 ENCOUNTER — Ambulatory Visit (HOSPITAL_COMMUNITY)
Admission: RE | Admit: 2016-03-30 | Discharge: 2016-03-30 | Disposition: A | Discharge status: Home / Self Care | Payer: BLUE CROSS/BLUE SHIELD | Source: Ambulatory Visit | Attending: Sports Medicine | Admitting: Sports Medicine

## 2016-03-30 DIAGNOSIS — R938 Abnormal findings on diagnostic imaging of other specified body structures: Secondary | ICD-10-CM | POA: Insufficient documentation

## 2016-03-30 DIAGNOSIS — R600 Localized edema: Secondary | ICD-10-CM | POA: Diagnosis not present

## 2016-03-30 DIAGNOSIS — M79672 Pain in left foot: Secondary | ICD-10-CM

## 2016-03-31 ENCOUNTER — Telehealth: Payer: Self-pay | Admitting: Sports Medicine

## 2016-03-31 NOTE — Telephone Encounter (Signed)
I spoke with the patient on the phone after reviewing the MRI of his left foot. No obvious stress fracture or tendon injury. He does have some nonspecific inflammation/edema in the forefoot which may be a stress related process. All of his pain began after he tried a new running shoe. He has since stopped wearing that shoe. In fact, we put him in a postop shoe last week and as a result, his symptoms are improving. I recommended that he remain in his postop shoe for a total of 3 weeks. He can then wean to a regular shoe. Also recommended that he start some over-the-counter Aleve twice daily for the next 3-4 days. Once he is able to walk comfortably without any returning pain he may resume some running. If his symptoms persist or are recurrent, then he will return to the office for reevaluation and gait analysis.

## 2016-04-04 ENCOUNTER — Ambulatory Visit (HOSPITAL_COMMUNITY): Payer: BLUE CROSS/BLUE SHIELD

## 2016-04-17 DIAGNOSIS — H43813 Vitreous degeneration, bilateral: Secondary | ICD-10-CM | POA: Diagnosis not present

## 2016-04-17 DIAGNOSIS — H35413 Lattice degeneration of retina, bilateral: Secondary | ICD-10-CM | POA: Diagnosis not present

## 2016-04-17 DIAGNOSIS — H33321 Round hole, right eye: Secondary | ICD-10-CM | POA: Diagnosis not present

## 2016-04-17 DIAGNOSIS — H33312 Horseshoe tear of retina without detachment, left eye: Secondary | ICD-10-CM | POA: Diagnosis not present

## 2016-05-01 MED FILL — NITROGLYCERIN 0.2 MG/HR PTC: 0.2 | 60 days supply | Qty: 30 | Fill #2

## 2016-06-03 ENCOUNTER — Encounter: Payer: Self-pay | Admitting: Sports Medicine

## 2016-06-03 ENCOUNTER — Ambulatory Visit (INDEPENDENT_AMBULATORY_CARE_PROVIDER_SITE_OTHER): Payer: BLUE CROSS/BLUE SHIELD | Admitting: Sports Medicine

## 2016-06-03 VITALS — BP 156/84 | Ht 75.0 in | Wt 196.0 lb

## 2016-06-03 DIAGNOSIS — G8929 Other chronic pain: Secondary | ICD-10-CM | POA: Diagnosis not present

## 2016-06-03 DIAGNOSIS — M25562 Pain in left knee: Secondary | ICD-10-CM

## 2016-06-03 DIAGNOSIS — R269 Unspecified abnormalities of gait and mobility: Secondary | ICD-10-CM | POA: Insufficient documentation

## 2016-06-03 DIAGNOSIS — M25561 Pain in right knee: Secondary | ICD-10-CM

## 2016-06-03 NOTE — Progress Notes (Signed)
  CC; Bilat anterior knee pain  Patient is an active triathlete During past 2 mos training and racing but with progressive ant knee pain In 2014 had this DX by MRI and XR with PFS and mild OA Viscosupplementation seemed to help  New pain triggered by poor bike fit on loaner bike  Past HX  AT Labral tear in shoulder  Soc: Non smoker  ROs No giving way No locking No swelling noted  PE Athletic older M/ difficulty hearing/ NAD BP (!) 156/84   Ht 6\' 3"  (1.905 m)   Wt 196 lb (88.9 kg)   BMI 24.50 kg/m   Knee RT and Left: Normal to inspection with no erythema or effusion or obvious bony abnormalities. Palpation normal with no warmth or joint line tenderness or patellar tenderness or condyle tenderness. ROM normal in flexion and extension and lower leg rotation. Ligaments with solid consistent endpoints including ACL, PCL, LCL, MCL. Negative Mcmurray's and provocative meniscal tests. Non painful patellar compression. Patellar and quadriceps tendons unremarkable. Hamstring and quadriceps strength is normal.  Assymetry with RT quad 1 cm less than left  Hip abduction weakness only on Left  Running gait - genu varus bilat Has antalgic limp on RT

## 2016-06-03 NOTE — Assessment & Plan Note (Signed)
We need to see if he can improve gait after PT  If not will work on gait and possible shoe/ orthotic changes

## 2016-06-03 NOTE — Assessment & Plan Note (Signed)
While his overall strength is excellent he has imbalances  I would like him to work with PT on getting this improved  Reck post 8 sessions

## 2016-06-13 DIAGNOSIS — M222X1 Patellofemoral disorders, right knee: Secondary | ICD-10-CM | POA: Diagnosis not present

## 2016-06-13 DIAGNOSIS — M222X2 Patellofemoral disorders, left knee: Secondary | ICD-10-CM | POA: Diagnosis not present

## 2016-06-18 DIAGNOSIS — M222X2 Patellofemoral disorders, left knee: Secondary | ICD-10-CM | POA: Diagnosis not present

## 2016-06-18 DIAGNOSIS — M222X1 Patellofemoral disorders, right knee: Secondary | ICD-10-CM | POA: Diagnosis not present

## 2016-06-25 DIAGNOSIS — M222X1 Patellofemoral disorders, right knee: Secondary | ICD-10-CM | POA: Diagnosis not present

## 2016-06-25 DIAGNOSIS — M222X2 Patellofemoral disorders, left knee: Secondary | ICD-10-CM | POA: Diagnosis not present

## 2016-06-30 ENCOUNTER — Other Ambulatory Visit: Payer: BLUE CROSS/BLUE SHIELD

## 2016-06-30 DIAGNOSIS — Z0000 Encounter for general adult medical examination without abnormal findings: Secondary | ICD-10-CM

## 2016-06-30 DIAGNOSIS — E785 Hyperlipidemia, unspecified: Secondary | ICD-10-CM | POA: Diagnosis not present

## 2016-06-30 DIAGNOSIS — I10 Essential (primary) hypertension: Secondary | ICD-10-CM | POA: Diagnosis not present

## 2016-06-30 DIAGNOSIS — Z79899 Other long term (current) drug therapy: Secondary | ICD-10-CM | POA: Diagnosis not present

## 2016-06-30 LAB — COMPLETE METABOLIC PANEL WITH GFR
ALT: 18 U/L (ref 9–46)
AST: 29 U/L (ref 10–35)
Albumin: 4.7 g/dL (ref 3.6–5.1)
Alkaline Phosphatase: 33 U/L — ABNORMAL LOW (ref 40–115)
BUN: 15 mg/dL (ref 7–25)
CALCIUM: 10.1 mg/dL (ref 8.6–10.3)
CHLORIDE: 104 mmol/L (ref 98–110)
CO2: 27 mmol/L (ref 20–31)
Creat: 1.11 mg/dL (ref 0.70–1.25)
GFR, EST AFRICAN AMERICAN: 81 mL/min (ref >=60)
GFR, Est Non African American: 70 mL/min (ref >=60)
Glucose, Bld: 107 mg/dL — ABNORMAL HIGH (ref 70–99)
POTASSIUM: 4.6 mmol/L (ref 3.5–5.3)
Sodium: 139 mmol/L (ref 135–146)
Total Bilirubin: 1 mg/dL (ref 0.2–1.2)
Total Protein: 7.2 g/dL (ref 6.1–8.1)

## 2016-06-30 LAB — CBC WITH DIFFERENTIAL/PLATELET
BASOS PCT: 0 %
Basophils Absolute: 0 cells/uL (ref 0–200)
EOS ABS: 93 {cells}/uL (ref 15–500)
Eosinophils Relative: 3 %
HEMATOCRIT: 48.2 % (ref 38.5–50.0)
Hemoglobin: 16.2 g/dL (ref 13.0–17.0)
LYMPHS PCT: 49 %
Lymphs Abs: 1519 cells/uL (ref 850–3900)
MCH: 31.2 pg (ref 27.0–33.0)
MCHC: 33.6 g/dL (ref 32.0–36.0)
MCV: 92.7 fL (ref 80.0–100.0)
MONO ABS: 279 {cells}/uL (ref 200–950)
MPV: 10.4 fL (ref 7.5–12.5)
Monocytes Relative: 9 %
NEUTROS PCT: 39 %
Neutro Abs: 1209 cells/uL — ABNORMAL LOW (ref 1500–7800)
Platelets: 172 10*3/uL (ref 140–400)
RBC: 5.2 MIL/uL (ref 4.20–5.80)
RDW: 14.1 % (ref 11.0–15.0)
WBC: 3.1 10*3/uL — ABNORMAL LOW (ref 3.8–10.8)

## 2016-06-30 LAB — LIPID PANEL
CHOL/HDL RATIO: 3.1 ratio (ref <=5.0)
CHOLESTEROL: 270 mg/dL — AB (ref 125–200)
HDL: 88 mg/dL (ref >=40)
LDL Cholesterol: 167 mg/dL — ABNORMAL HIGH (ref <130)
TRIGLYCERIDES: 77 mg/dL (ref <150)
VLDL: 15 mg/dL (ref <30)

## 2016-06-30 LAB — PSA: PSA: 0.5 ng/mL (ref <=4.0)

## 2016-06-30 LAB — TSH: TSH: 2.88 m[IU]/L (ref 0.40–4.50)

## 2016-07-01 DIAGNOSIS — M222X2 Patellofemoral disorders, left knee: Secondary | ICD-10-CM | POA: Diagnosis not present

## 2016-07-01 DIAGNOSIS — M222X1 Patellofemoral disorders, right knee: Secondary | ICD-10-CM | POA: Diagnosis not present

## 2016-07-03 ENCOUNTER — Encounter: Payer: Self-pay | Admitting: Family Medicine

## 2016-07-03 ENCOUNTER — Ambulatory Visit (INDEPENDENT_AMBULATORY_CARE_PROVIDER_SITE_OTHER): Payer: BLUE CROSS/BLUE SHIELD | Admitting: Family Medicine

## 2016-07-03 VITALS — BP 132/74 | HR 56 | Temp 97.7°F | Resp 14 | Ht 75.0 in | Wt 201.0 lb

## 2016-07-03 DIAGNOSIS — E7800 Pure hypercholesterolemia, unspecified: Secondary | ICD-10-CM | POA: Diagnosis not present

## 2016-07-03 DIAGNOSIS — Z0000 Encounter for general adult medical examination without abnormal findings: Secondary | ICD-10-CM

## 2016-07-03 MED ORDER — ATORVASTATIN CALCIUM 10 MG PO TABS: 10.0000 mg | ORAL_TABLET | Freq: Every day | ORAL | 3 refills | Status: DC

## 2016-07-03 MED FILL — ATORVASTATIN 10 MG TABLET: 10 | 90 days supply | Qty: 90 | Fill #0

## 2016-07-03 NOTE — Progress Notes (Signed)
Subjective:    Patient ID: Patrick Wall, male    DOB: 11-25-1950, 65 y.o.   MRN: [id number]  HPI  Here for Cpe.  Last colonoscopy was in 2014 and is not due again until 2024. He is due for prostate exam today. Flu shot is up-to-date. Patient is not due for Pneumovax 23 again until 2018.  He will be due for Prevnar 13 at age 83. Shingles vaccine is up-to-date. Tetanus vaccine is up-to-date.   Most recent labwork is listed below: Lab on 06/30/2016  Component Date Value Ref Range Status  . Sodium 06/30/2016 139  135 - 146 mmol/L Final  . Potassium 06/30/2016 4.6  3.5 - 5.3 mmol/L Final  . Chloride 06/30/2016 104  98 - 110 mmol/L Final  . CO2 06/30/2016 27  20 - 31 mmol/L Final  . Glucose, Bld 06/30/2016 107* 70 - 99 mg/dL Final  . BUN 06/30/2016 15  7 - 25 mg/dL Final  . Creat 06/30/2016 1.11  0.70 - 1.25 mg/dL Final   Comment:   For patients > or = 64 years of age: The upper reference limit for Creatinine is approximately 13% higher for people identified as African-American.     . Total Bilirubin 06/30/2016 1.0  0.2 - 1.2 mg/dL Final  . Alkaline Phosphatase 06/30/2016 33* 40 - 115 U/L Final  . AST 06/30/2016 29  10 - 35 U/L Final  . ALT 06/30/2016 18  9 - 46 U/L Final  . Total Protein 06/30/2016 7.2  6.1 - 8.1 g/dL Final  . Albumin 06/30/2016 4.7  3.6 - 5.1 g/dL Final  . Calcium 06/30/2016 10.1  8.6 - 10.3 mg/dL Final  . GFR, Est African American 06/30/2016 81  >=60 mL/min Final  . GFR, Est Non African American 06/30/2016 70  >=60 mL/min Final  . TSH 06/30/2016 2.88  0.40 - 4.50 mIU/L Final  . Cholesterol 06/30/2016 270* 125 - 200 mg/dL Final  . Triglycerides 06/30/2016 77  <150 mg/dL Final  . HDL 06/30/2016 88  >=40 mg/dL Final  . Total CHOL/HDL Ratio 06/30/2016 3.1  <=5.0 Ratio Final  . VLDL 06/30/2016 15  <30 mg/dL Final  . LDL Cholesterol 06/30/2016 167* <130 mg/dL Final   Comment:   Total Cholesterol/HDL Ratio:CHD Risk                        Coronary Heart Disease Risk  Table                                        Men       Women          1/2 Average Risk              3.4        3.3              Average Risk              5.0        4.4           2X Average Risk              9.6        7.1           3X Average Risk             23.4  11.0 Use the calculated Patient Ratio above and the CHD Risk table  to determine the patient's CHD Risk.   . WBC 06/30/2016 3.1* 3.8 - 10.8 K/uL Final  . RBC 06/30/2016 5.20  4.20 - 5.80 MIL/uL Final  . Hemoglobin 06/30/2016 16.2  13.0 - 17.0 g/dL Final  . HCT 06/30/2016 48.2  38.5 - 50.0 % Final  . MCV 06/30/2016 92.7  80.0 - 100.0 fL Final  . MCH 06/30/2016 31.2  27.0 - 33.0 pg Final  . MCHC 06/30/2016 33.6  32.0 - 36.0 g/dL Final  . RDW 06/30/2016 14.1  11.0 - 15.0 % Final  . Platelets 06/30/2016 172  140 - 400 K/uL Final  . MPV 06/30/2016 10.4  7.5 - 12.5 fL Final  . Neutro Abs 06/30/2016 1209* 1,500 - 7,800 cells/uL Final  . Lymphs Abs 06/30/2016 1519  850 - 3,900 cells/uL Final  . Monocytes Absolute 06/30/2016 279  200 - 950 cells/uL Final  . Eosinophils Absolute 06/30/2016 93  15 - 500 cells/uL Final  . Basophils Absolute 06/30/2016 0  0 - 200 cells/uL Final  . Neutrophils Relative % 06/30/2016 39  % Final  . Lymphocytes Relative 06/30/2016 49  % Final  . Monocytes Relative 06/30/2016 9  % Final  . Eosinophils Relative 06/30/2016 3  % Final  . Basophils Relative 06/30/2016 0  % Final  . Smear Review 06/30/2016 Criteria for review not met   Final  . PSA 06/30/2016 0.5  <=4.0 ng/mL Final   Comment:   The total PSA value from this assay system is standardized against the WHO standard. The test result will be approximately 20% lower when compared to the equimolar-standardized total PSA (Beckman Coulter). Comparison of serial PSA results should be interpreted with this fact in mind.   This test was performed using the Siemens chemiluminescent method. Values obtained from different assay methods cannot be  used interchangeably. PSA levels, regardless of value, should not be interpreted as absolute evidence of the presence or absence of disease.   Effective April 07, 2016, Total PSA is being tested on the Siemens Centaur XP using chemiluminescence methodology. Re-baseline testing will be available until July 07, 2016 at no charge. If you have a patient that may require re-baselining, please order [id number] in addition to [id number].     Past Medical History:  Diagnosis Date  . Hearing loss   . Hyperlipidemia   . Labral tear of shoulder    Past Surgical History:  Procedure Laterality Date  . EYE SURGERY  2000   lasic  . VASECTOMY  1983   Current Outpatient Prescriptions on File Prior to Visit  Medication Sig Dispense Refill  . aspirin 81 MG tablet Take 81 mg by mouth daily.    . cholecalciferol (VITAMIN D) 1000 UNITS tablet Take 1,000 Units by mouth daily.    Marland Kitchen HYDROcodone-acetaminophen (NORCO) 10-325 MG tablet   0  . ibuprofen (ADVIL,MOTRIN) 800 MG tablet   2  . Multiple Vitamin (MULTIVITAMIN) capsule Take 1 capsule by mouth daily.    . nitroGLYCERIN (NITRODUR - DOSED IN MG/24 HR) 0.2 mg/hr patch Place 1/4 to 1/2 patch on affected area daily 30 patch 2   No current facility-administered medications on file prior to visit.    Allergies  Allergen Reactions  . Sulfa Antibiotics    Social History   Social History  . Marital status: Married    Spouse name: N/A  . Number of children: N/A  . Years of education: N/A  Occupational History  . Not on file.   Social History Main Topics  . Smoking status: Never Smoker  . Smokeless tobacco: Not on file  . Alcohol use 5.4 oz/week    5 Glasses of wine, 4 Cans of beer per week  . Drug use: No  . Sexual activity: Yes    Birth control/ protection: Surgical     Comment: vasectomy   Other Topics Concern  . Not on file   Social History Narrative  . No narrative on file   Family History  Problem Relation Age of Onset  . Cancer  Mother     skin  . Heart disease Mother   . Vision loss Mother   . Early death Father   . Arthritis Maternal Grandmother   . Cancer Maternal Grandmother   . Hyperlipidemia Maternal Grandmother   . Hypertension Maternal Grandmother   . Cancer Maternal Grandfather   . Heart disease Paternal Grandfather   . Stroke Paternal Grandfather       Review of Systems  All other systems reviewed and are negative.      Objective:   Physical Exam  Constitutional: He is oriented to person, place, and time. He appears well-developed and well-nourished. No distress.  HENT:  Head: Normocephalic and atraumatic.  Right Ear: External ear normal.  Left Ear: External ear normal.  Nose: Nose normal.  Mouth/Throat: Oropharynx is clear and moist. No oropharyngeal exudate.  Eyes: Conjunctivae and EOM are normal. Pupils are equal, round, and reactive to light. Right eye exhibits no discharge. Left eye exhibits no discharge. No scleral icterus.  Neck: Normal range of motion. Neck supple. No JVD present. No tracheal deviation present. No thyromegaly present.  Cardiovascular: Normal rate, regular rhythm, normal heart sounds and intact distal pulses.  Exam reveals no gallop and no friction rub.   No murmur heard. Pulmonary/Chest: Effort normal and breath sounds normal. No respiratory distress. He has no wheezes. He has no rales. He exhibits no tenderness.  Abdominal: Soft. Bowel sounds are normal. He exhibits no distension and no mass. There is no tenderness. There is no rebound and no guarding.  Genitourinary: Rectum normal, prostate normal and penis normal. Rectal exam shows guaiac negative stool. No penile tenderness.  Musculoskeletal: He exhibits no edema.  Lymphadenopathy:    He has no cervical adenopathy.  Neurological: He is alert and oriented to person, place, and time. He displays normal reflexes. No cranial nerve deficit. He exhibits normal muscle tone. Coordination normal.  Skin: Skin is warm. No  rash noted. He is not diaphoretic. No erythema. No pallor.  Psychiatric: He has a normal mood and affect. His behavior is normal. Judgment and thought content normal.  Vitals reviewed.         Assessment & Plan:  Routine general medical examination at a health care facility  Pure hypercholesterolemia - Plan: atorvastatin (LIPITOR) 10 MG tablet  Father died in his late 28s suddenly with what sounds like a heart attack. His father Sr. died in her early 43s from a heart attack. Therefore I believe he has a strong family history of premature cardiovascular disease. Given that and his age, I would like his LDL cholesterol to be below 130 even though his HDL cholesterol is so good. Therefore I recommended starting low-dose Lipitor 10 mg by mouth daily and rechecking a fasting lipid panel in 3 months. The remainder of his lab work is excellent aside from his mild elevation in fasting blood sugar. I recommended a low  carb diet. Immunizations are up-to-date. Cancer screening is up-to-date.

## 2016-07-09 DIAGNOSIS — M222X1 Patellofemoral disorders, right knee: Secondary | ICD-10-CM | POA: Diagnosis not present

## 2016-07-09 DIAGNOSIS — M222X2 Patellofemoral disorders, left knee: Secondary | ICD-10-CM | POA: Diagnosis not present

## 2016-07-14 ENCOUNTER — Encounter: Payer: Self-pay | Admitting: Sports Medicine

## 2016-07-22 DIAGNOSIS — M222X1 Patellofemoral disorders, right knee: Secondary | ICD-10-CM | POA: Diagnosis not present

## 2016-07-22 DIAGNOSIS — M222X2 Patellofemoral disorders, left knee: Secondary | ICD-10-CM | POA: Diagnosis not present

## 2016-07-31 DIAGNOSIS — M222X2 Patellofemoral disorders, left knee: Secondary | ICD-10-CM | POA: Diagnosis not present

## 2016-07-31 DIAGNOSIS — M222X1 Patellofemoral disorders, right knee: Secondary | ICD-10-CM | POA: Diagnosis not present

## 2016-08-12 ENCOUNTER — Encounter: Payer: Self-pay | Admitting: Sports Medicine

## 2016-08-12 ENCOUNTER — Ambulatory Visit (INDEPENDENT_AMBULATORY_CARE_PROVIDER_SITE_OTHER): Payer: BLUE CROSS/BLUE SHIELD | Admitting: Sports Medicine

## 2016-08-12 DIAGNOSIS — M25562 Pain in left knee: Secondary | ICD-10-CM

## 2016-08-12 DIAGNOSIS — M25561 Pain in right knee: Secondary | ICD-10-CM

## 2016-08-12 DIAGNOSIS — G8929 Other chronic pain: Secondary | ICD-10-CM | POA: Diagnosis not present

## 2016-08-12 NOTE — Progress Notes (Signed)
  Patrick Wall - 65 y.o. male MRN [id number]  Date of birth: Feb 21, 1951  SUBJECTIVE:  Including CC & ROS.  CC: Right knee pain f/u  Patrick Wall is a 65yo male presenting for follow up bilateral suprapatellar knee pain. Pt reports that since last being seen and having PT he still has bilateral knee pain worse on the right. Pt has completed almost 7 weeks of PT and mentions that he felt that his hip strength improved. The symptoms are intermittent. The pain is worse when sitting for long periods of time with knee flexed.He mentioned that he has worn knee sleeves without relief. Patient has not taken any medications for pain relief. Pt reports that he would like to soon prepare for his half marathon ([address] marathon) in March 2018.   ROS: no numbness, no fever, no swelling, no redness, no injury/trauma, no gait abnormality    HISTORY: Past Medical, Surgical, Social, and Family History Reviewed & Updated per EMR.   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 Enjoys running.  Medications -asa, atorvastatin, vitamin d  DATA REVIEWED:   PHYSICAL EXAM:  VS: BP:127/86  HR: bpm  TEMP: ( )  RESP:   HT:6\' 3"  (190.5 cm)   WT:196 lb (88.9 kg)  BMI:24.5 PHYSICAL EXAM: Gen: NAD, alert, difficultly with hearing, cooperative with exam, well-appearing Left and right knees: Normal to inspection with no erythema or effusion or obvious bony abnormalities. Palpation normal with no warmth, joint line tenderness, patellar tenderness ROM full in flexion and extension and lower leg rotation. Ligaments with solid consistent endpoints Negative Mcmurray's and meniscal tests Patellar and quad tendons without laxity or tenderness  Left thigh circumference larger than right by approximately 1cm  Hip abduction strength 5/5 bilaterally  Hip flexion 5/5 bilaterally   What he demonstrates however is weakness with dynamic testing Unable to  do forward or lateral step downs with knee control Unable to do 1 foot min squat Balance feels somewhat unstaedy    ASSESSMENT & PLAN:  Patellar tracking instability: Hip abduction strength improved since last being seen. ROM not limited so less likely arthritis. Functional balance deficit in one leg stance when testing biomechanical maneuvers of bilateral knees. -Finish last session of PT then can continue with at home exercises (3 sets of 15) including wall squats to 90 degrees, 1 leg mini-squats to 30 degrees, step downs to front and side -Follow up 6 weeks

## 2016-08-12 NOTE — Assessment & Plan Note (Signed)
This still appears dynamic primarily even though he has mild PF OA  Work with balance exercises and 1 leg strength  Reck 2 mos

## 2016-08-12 NOTE — Patient Instructions (Signed)
I need you to work on getting better control of knee motion on 1 leg stance Particularly with 1 leg exercises  Wall squats take down to about 80 to 90 degs.  Mini 1 leg squats to about 30 degs.  Step downs in front and to the side.  All exercises do 3 sets of 15.  Be sure you are doing these well and then maybe try running again in about 1 month  See me in 6 to 8 weeks to check your running form and knee control.  You have abnormal patellar tracking but no arthritis or serious injury.

## 2016-08-14 DIAGNOSIS — M222X2 Patellofemoral disorders, left knee: Secondary | ICD-10-CM | POA: Diagnosis not present

## 2016-08-14 DIAGNOSIS — M222X1 Patellofemoral disorders, right knee: Secondary | ICD-10-CM | POA: Diagnosis not present

## 2016-09-23 ENCOUNTER — Ambulatory Visit (INDEPENDENT_AMBULATORY_CARE_PROVIDER_SITE_OTHER): Payer: BLUE CROSS/BLUE SHIELD | Admitting: Sports Medicine

## 2016-09-23 ENCOUNTER — Ambulatory Visit: Payer: Self-pay

## 2016-09-23 VITALS — BP 140/80 | Ht 75.0 in | Wt 196.0 lb

## 2016-09-23 DIAGNOSIS — G8929 Other chronic pain: Secondary | ICD-10-CM | POA: Diagnosis not present

## 2016-09-23 DIAGNOSIS — M25562 Pain in left knee: Secondary | ICD-10-CM

## 2016-09-23 DIAGNOSIS — M25561 Pain in right knee: Secondary | ICD-10-CM

## 2016-09-23 MED ORDER — NITROGLYCERIN 0.2 MG/HR TD PT24: MEDICATED_PATCH | TRANSDERMAL | 2 refills | Status: DC

## 2016-09-23 MED FILL — NITROGLYCERIN 0.2 MG/HR PTC: 0.2 | 30 days supply | Qty: 15 | Fill #0

## 2016-09-23 NOTE — Patient Instructions (Signed)

## 2016-09-23 NOTE — Progress Notes (Signed)
  Patrick Wall - 66 y.o. male MRN [id number]  Date of birth: 12/13/1950  SUBJECTIVE:  Including CC & ROS.   [name] is a 66 year old man that is following up for bilateral knee pain. He was seen last month for this and was started on a home exercise program. He has been compliant with this program and has had no improvement of his pain. He had taken 3 months off previously to this month and not run at all. As he started running at the beginning of this month, his pain started to present itself again. He reports having similar pain in 2014 is receiving hyaluronic acid injections helped at that time. He has only been running 4 miles per week and tends to have the pain at the end of the run. He has the pain on the superior aspect of the patella. He denies any swelling. He has not been taking any medications. He would like to qualify for the world's in April.  ROS: No unexpected weight loss, fever, chills, swelling, instability, numbness/tingling, redness, otherwise see HPI    HISTORY: Past Medical, Surgical, Social, and Family History Reviewed & Updated per EMR.   Pertinent Historical Findings include: PMSHx -  hypertension, hearing loss, hyperlipidemia  DATA REVIEWED: None  PHYSICAL EXAM:  VS: BP:140/80  HR: bpm  TEMP: ( )  RESP:   HT:6\' 3"  (190.5 cm)   WT:196 lb (88.9 kg)  BMI:24.5 PHYSICAL EXAM: Gen: NAD, alert, cooperative with exam, well-appearing HEENT: clear conjunctiva, EOMI CV:  no edema, capillary refill brisk,  Resp: non-labored, normal speech Skin: no rashes, normal turgor  Neuro: no gross deficits.  Psych:  alert and oriented Knee: Normal to inspection with no erythema or effusion or obvious bony abnormalities. Palpation normal with no warmth, joint line tenderness, or condyle tenderness. Mild tenderness upon the superior pole of each patella ROM full in flexion and extension and lower leg rotation. Ligaments with solid consistent endpoints including LCL, MCL. Negative  Mcmurray's Non painful patellar compression. Patellar glide without crepitus. Patellar and quadriceps tendons unremarkable. Hamstring and quadriceps strength is normal.  Neurovascularly intact  Limited ultrasound: Left knee: There is no effusion in the suprapatellar pouch. Quadricep tendon was viewed in long axis and found to have a calcification near the insertion upon the patella. Patellar tendon was normal in appearance. The lateral and medial meniscus were normal in appearance. The trochlear groove had mild arthritic changes in the medial femoral condyle.  Summary: This is suggestive of a calcific tendinitis of the quadriceps tendon  Limited ultrasound: Right knee: There is no effusion the suprapatellar pouch. Quadricep tendon was viewed in long axis and found to have had a calcification that extended proximally into the quadriceps tendon. Patellar tendon was normal in appearance. The lateral and medial meniscus were normal in appearance. The trochlear groove was normal in appearance.  Summary: This is suggestive of a calcific tendinitis of the quadriceps tendon   ASSESSMENT & PLAN:   Bilateral knee pain Does not appear to be arthritic in nature but more related to this calcifications within his quadriceps tendon. - Initiate nitroglycerin protocol for each knee. - He'll follow-up in 4 weeks. If there is no improvement consider injection therapy at that time.

## 2016-09-24 NOTE — Assessment & Plan Note (Signed)
Does not appear to be arthritic in nature but more related to this calcifications within his quadriceps tendon. - Initiate nitroglycerin protocol for each knee. - He'll follow-up in 4 weeks. If there is no improvement consider injection therapy at that time.

## 2016-09-29 MED FILL — ATORVASTATIN 10 MG TABLET: 10 | 30 days supply | Qty: 30 | Fill #1

## 2016-10-15 ENCOUNTER — Other Ambulatory Visit: Payer: BLUE CROSS/BLUE SHIELD

## 2016-10-15 DIAGNOSIS — E785 Hyperlipidemia, unspecified: Secondary | ICD-10-CM

## 2016-10-15 DIAGNOSIS — Z79899 Other long term (current) drug therapy: Secondary | ICD-10-CM | POA: Diagnosis not present

## 2016-10-15 LAB — HEPATIC FUNCTION PANEL
ALK PHOS: 30 U/L — AB (ref 40–115)
ALT: 18 U/L (ref 9–46)
AST: 30 U/L (ref 10–35)
Albumin: 4.5 g/dL (ref 3.6–5.1)
BILIRUBIN DIRECT: 0.2 mg/dL (ref <=0.2)
BILIRUBIN INDIRECT: 0.7 mg/dL (ref 0.2–1.2)
BILIRUBIN TOTAL: 0.9 mg/dL (ref 0.2–1.2)
Total Protein: 6.7 g/dL (ref 6.1–8.1)

## 2016-10-15 LAB — LIPID PANEL
CHOL/HDL RATIO: 2.1 ratio (ref <5.0)
Cholesterol: 179 mg/dL (ref <200)
HDL: 87 mg/dL (ref >40)
LDL CALC: 82 mg/dL (ref <100)
Triglycerides: 49 mg/dL (ref <150)
VLDL: 10 mg/dL (ref <30)

## 2016-10-27 MED FILL — NITROGLYCERIN 0.2 MG/HR PTC: 0.2 | 30 days supply | Qty: 15 | Fill #1

## 2016-10-27 MED FILL — ATORVASTATIN 10 MG TABLET: 10 | 30 days supply | Qty: 30 | Fill #2

## 2016-10-28 ENCOUNTER — Ambulatory Visit (INDEPENDENT_AMBULATORY_CARE_PROVIDER_SITE_OTHER): Payer: BLUE CROSS/BLUE SHIELD | Admitting: Sports Medicine

## 2016-10-28 ENCOUNTER — Ambulatory Visit: Payer: Self-pay

## 2016-10-28 ENCOUNTER — Encounter: Payer: Self-pay | Admitting: Sports Medicine

## 2016-10-28 VITALS — BP 145/82 | Ht 75.0 in | Wt 193.0 lb

## 2016-10-28 DIAGNOSIS — G8929 Other chronic pain: Secondary | ICD-10-CM

## 2016-10-28 DIAGNOSIS — M25561 Pain in right knee: Secondary | ICD-10-CM

## 2016-10-28 DIAGNOSIS — M25562 Pain in left knee: Secondary | ICD-10-CM

## 2016-10-29 NOTE — Progress Notes (Signed)
  Patrick Wall - 66 y.o. male MRN [id number]  Date of birth: [birth year]  SUBJECTIVE:  Including CC & ROS.   [name] is a 66 yo M that is following up for bilateral knee pain. He was diagnosed with calcified quadricep tendinitis. He has been using nitro patches upon both knees. He reports the pain is about the pain. He has been able to run a race but had to take pain medication prior to the run. He stopped his vitamin D3. He has also started drinking vinegar and honey. He has a race at the end of March but the main race is at the end of May.   ROS: No unexpected weight loss, fever, chills, swelling, instability, numbness/tingling, redness, otherwise see HPI    HISTORY: Past Medical, Surgical, Social, and Family History Reviewed & Updated per EMR.   Pertinent Historical Findings include: PMSHx -  hypertension, hearing loss, hyperlipidemia   DATA REVIEWED: Previous ultrasounds   PHYSICAL EXAM:  VS: BP:(!) 145/82  HR: bpm  TEMP: ( )  RESP:   HT:6\' 3"  (190.5 cm)   WT:193 lb (87.5 kg)  BMI:24.2 PHYSICAL EXAM: Gen: NAD, alert, cooperative with exam, well-appearing HEENT: clear conjunctiva, EOMI CV:  no edema, capillary refill brisk,  Resp: non-labored, normal speech Skin: no rashes, normal turgor  Neuro: no gross deficits.  Psych:  alert and oriented Bilateral Knee:  Normal to inspection with no erythema or effusion or obvious bony abnormalities. Palpation normal with no warmth, joint line tenderness, or condyle tenderness. Mild tenderness upon the superior pole of each patella ROM full in flexion and extension and lower leg rotation. Ligaments with solid consistent endpoints including LCL, MCL. Negative Mcmurray's Non painful patellar compression. Patellar glide without crepitus. Patellar and quadriceps tendons unremarkable. Hamstring and quadriceps strength is normal.  Neurovascularly intact  Limited ultrasound: Left Knee:  The QT was viewed in long axis and there is  resolving of the calcification of the QT.  There wasn't an effusion.   Summary: resolving calcified quadriceps tendinitis    Ultrasound and interpretation by Clare GandyJeremy Verley Pariseau, MD  Limited ultrasound: Right knee:  There is still a large calcification within the QT.  There isn't an effusion   Summary: Continued calcified quadriceps tendinitis.    Ultrasound and interpretation by Clare GandyJeremy Atharva Mirsky, MD    ASSESSMENT & PLAN:   Bilateral knee pain His left QT seems to be improving but the right still has a large calcification.  - continue nitro patches on either knee  - he will follow up after his race in May. At that time we will re-scan and can consider more aggressive measures if warranted.

## 2016-10-29 NOTE — Assessment & Plan Note (Signed)
His left QT seems to be improving but the right still has a large calcification.  - continue nitro patches on either knee  - he will follow up after his race in May. At that time we will re-scan and can consider more aggressive measures if warranted.

## 2016-11-05 ENCOUNTER — Encounter: Payer: Self-pay | Admitting: Family Medicine

## 2016-11-10 ENCOUNTER — Ambulatory Visit: Payer: BLUE CROSS/BLUE SHIELD | Admitting: Family Medicine

## 2016-11-13 ENCOUNTER — Encounter: Payer: Self-pay | Admitting: Family Medicine

## 2016-11-13 ENCOUNTER — Ambulatory Visit (INDEPENDENT_AMBULATORY_CARE_PROVIDER_SITE_OTHER): Payer: BLUE CROSS/BLUE SHIELD | Admitting: Family Medicine

## 2016-11-13 VITALS — BP 144/70 | HR 56 | Temp 97.8°F | Resp 16 | Ht 75.0 in | Wt 203.0 lb

## 2016-11-13 DIAGNOSIS — E7800 Pure hypercholesterolemia, unspecified: Secondary | ICD-10-CM | POA: Diagnosis not present

## 2016-11-13 NOTE — Progress Notes (Signed)
Subjective:    Patient ID: Patrick Wall, male    DOB: October 24, 1950, 66 y.o.   MRN: [id number]  HPI  At the patient's last office visit, I started him back on Lipitor 10 mg a day for hyperlipidemia. At that time his LDL cholesterol is greater than 160 and his 10 year cardiovascular risk was greater than 10%. Since taking the Lipitor, the patient denies any myalgias or right upper quadrant pain. His blood pressure here today is elevated however he checks it everyday at home his systolic blood pressures consistently 160 with diastolic blood pressures 70-80. He is drastically reduced his cholesterol as evidenced below: No visits with results within 1 Month(s) from this visit.  Latest known visit with results is:  Lab on 10/15/2016  Component Date Value Ref Range Status  . Total Bilirubin 10/15/2016 0.9  0.2 - 1.2 mg/dL Final  . Bilirubin, Direct 10/15/2016 0.2  <=0.2 mg/dL Final  . Indirect Bilirubin 10/15/2016 0.7  0.2 - 1.2 mg/dL Final  . Alkaline Phosphatase 10/15/2016 30* 40 - 115 U/L Final  . AST 10/15/2016 30  10 - 35 U/L Final  . ALT 10/15/2016 18  9 - 46 U/L Final  . Total Protein 10/15/2016 6.7  6.1 - 8.1 g/dL Final  . Albumin 40/98/119102/14/2018 4.5  3.6 - 5.1 g/dL Final  . Cholesterol 47/82/956202/14/2018 179  <200 mg/dL Final  . Triglycerides 10/15/2016 49  <150 mg/dL Final  . HDL 13/08/657802/14/2018 87  >40 mg/dL Final  . Total CHOL/HDL Ratio 10/15/2016 2.1  <4.6<5.0 Ratio Final  . VLDL 10/15/2016 10  <30 mg/dL Final  . LDL Cholesterol 10/15/2016 82  <100 mg/dL Final    Past Medical History:  Diagnosis Date  . Hearing loss   . Hyperlipidemia   . Labral tear of shoulder    Past Surgical History:  Procedure Laterality Date  . EYE SURGERY  2000   lasic  . VASECTOMY  1983   Current Outpatient Prescriptions on File Prior to Visit  Medication Sig Dispense Refill  . aspirin 81 MG tablet Take 81 mg by mouth daily.    Marland Kitchen. atorvastatin (LIPITOR) 10 MG tablet Take 1 tablet (10 mg total) by mouth daily. 90 tablet  3  . Multiple Vitamin (MULTIVITAMIN) capsule Take 1 capsule by mouth daily.    . nitroGLYCERIN (NITRODUR - DOSED IN MG/24 HR) 0.2 mg/hr patch Place 1/4 to 1/2 patch on affected area daily 30 patch 2   No current facility-administered medications on file prior to visit.    Allergies  Allergen Reactions  . Sulfa Antibiotics    Social History   Social History  . Marital status: Married    Spouse name: N/A  . Number of children: N/A  . Years of education: N/A   Occupational History  . Not on file.   Social History Main Topics  . Smoking status: Never Smoker  . Smokeless tobacco: Never Used  . Alcohol use 5.4 oz/week    5 Glasses of wine, 4 Cans of beer per week  . Drug use: No  . Sexual activity: Yes    Birth control/ protection: Surgical     Comment: vasectomy   Other Topics Concern  . Not on file   Social History Narrative  . No narrative on file   Family History  Problem Relation Age of Onset  . Cancer Mother     skin  . Heart disease Mother   . Vision loss Mother   . Early death Father   .  Arthritis Maternal Grandmother   . Cancer Maternal Grandmother   . Hyperlipidemia Maternal Grandmother   . Hypertension Maternal Grandmother   . Cancer Maternal Grandfather   . Heart disease Paternal Grandfather   . Stroke Paternal Grandfather       Review of Systems  All other systems reviewed and are negative.      Objective:   Physical Exam  Constitutional: He is oriented to person, place, and time. He appears well-developed and well-nourished. No distress.  HENT:  Head: Normocephalic and atraumatic.  Cardiovascular: Normal rate, regular rhythm and normal heart sounds.  Exam reveals no gallop and no friction rub.   No murmur heard. Pulmonary/Chest: Effort normal and breath sounds normal. No respiratory distress. He has no wheezes. He has no rales. He exhibits no tenderness.  Musculoskeletal: He exhibits no edema.  Neurological: He is alert and oriented to  person, place, and time. No cranial nerve deficit. He exhibits normal muscle tone. Coordination normal.  Skin: Skin is warm. He is not diaphoretic.  Psychiatric: He has a normal mood and affect. His behavior is normal. Judgment and thought content normal.  Vitals reviewed.         Assessment & Plan:  Pure hypercholesterolemia  Cholesterol is outstanding. He has reduced his 10 year risk cardiovascular disease to less than 8%. The remainder of his risk factors are as well controlled as possible. His blood pressure elevation today is spurious and is not confirmed at home. Therefore I'll make no changes in his medication regimen

## 2016-11-27 MED FILL — NITROGLYCERIN 0.2 MG/HR PTC: 0.2 | 30 days supply | Qty: 15 | Fill #2

## 2016-12-03 ENCOUNTER — Encounter: Payer: Self-pay | Admitting: Family Medicine

## 2016-12-03 DIAGNOSIS — E7800 Pure hypercholesterolemia, unspecified: Secondary | ICD-10-CM

## 2016-12-03 MED ORDER — ATORVASTATIN CALCIUM 10 MG PO TABS: 10.0000 mg | ORAL_TABLET | Freq: Every day | ORAL | 3 refills | Status: DC

## 2016-12-03 MED FILL — ATORVASTATIN 10 MG TABLET: 10 | 30 days supply | Qty: 30 | Fill #3

## 2016-12-03 NOTE — Telephone Encounter (Signed)
Medication refilled per protocol.  Sent to patient's mail order pharmacy as requested.

## 2017-01-02 MED FILL — NITROGLYCERIN 0.2 MG/HR PTC: 0.2 | 30 days supply | Qty: 15 | Fill #3

## 2017-01-22 ENCOUNTER — Encounter: Payer: Self-pay | Admitting: Sports Medicine

## 2017-01-22 ENCOUNTER — Ambulatory Visit (INDEPENDENT_AMBULATORY_CARE_PROVIDER_SITE_OTHER): Payer: BLUE CROSS/BLUE SHIELD | Admitting: Sports Medicine

## 2017-01-22 DIAGNOSIS — M25562 Pain in left knee: Secondary | ICD-10-CM | POA: Diagnosis not present

## 2017-01-22 DIAGNOSIS — M25561 Pain in right knee: Secondary | ICD-10-CM | POA: Diagnosis not present

## 2017-01-22 DIAGNOSIS — G8929 Other chronic pain: Secondary | ICD-10-CM | POA: Diagnosis not present

## 2017-01-22 NOTE — Patient Instructions (Signed)
[facility]bert [facility] [address] Tuesday 01/27/17 at 1045a [phone]

## 2017-01-22 NOTE — Progress Notes (Signed)
Subjective:     Patient ID: Patrick Wall, male   DOB: 06-Jul-1951, 66 y.o.   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 He reports that he has had pain since last fall which is intermittent and often switches from one knee to the other. Pain is located along superior aspect of knees. The worst pain is with standing after prolonged sitting and with intense workouts. He also has intermittent locking sensation. Modalities he has been using for treatment include daily static strengthening, stretching, foam roller and intermittent use of inflatable compression sleeve. He uses NTG patches only after big workouts. He brings in an article describing case study on barbotage to discuss today, and is also interested in dry needling. Of note, patient reports he was treated for similar pain in 2014 with hyaluronic acid injections.   Review of Systems - numbness/tingling - radiating pain - change in gait - headaches with NTG    Objective:   Physical Exam BP 110/70   Ht 6\' 3"  (1.905 m)   Wt 196 lb (88.9 kg)   BMI 24.50 kg/m  General: Patient is athletic appearing, well developed male, sitting on exam table. A&O x3. NAD. Extremities: Warm and well-perfused. Cap refill < 3 sec. MSK:  Knees: Normal to inspection bilaterally with no erythema or effusion or obvious bony abnormalities. Tenderness to palpation of medial superior side of patella bilaterally at distal insertion of quads tendons. No warmth or joint line tenderness, patellar tenderness or condyle tenderness. ROM normal in flexion and extension and lower leg rotation. Ligaments with solid consistent endpoints including ACL, PCL, LCL, MCL. Negative Mcmurray's and provocative meniscal tests. Hamstring and quadriceps strength is 5/5 bilaterally. Neuro:  Patient with intact sensation over bilateral lower extremities without overt focal deficits.  Ultrasound of knees 01/22/17:  Left suprapatellar pouch with small hypoechoic area in quadriceps tendon concerning for partial tear of tendon.  Right suprapatellar pouch with multiple small and one large hyperechoic areas in quadriceps tendon concerning for bone spur and calcific deposits. Longest spur is 1.5 cms  Medial meniscus intact without hypoechoic change No effusion noted  Ultrasound and interpretation by Sibyl ParrKarl B. 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 We discussed that dry needling was not a good option for him as could cause additional tearing with microtrauma. He has been struggling with this knee pain for a while now and is a good, healthy candidate for intervention on right knee bone spur. Barbotage may be an option in the future however there is not great data showing benefit in knees. He may have a component of patellofemoral osteoarthritis or tracking issue that can be revisited after Xrays.    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 Thurston HoleWainer for 01/27/17 at 10:45am - Will likely get xrays at this visit  - may need surgical intervention to remove calcium and spurring   Patrick Wall [employer] MS4    I personally was present  and performed or re-performed the history, physical exam and medical decision-making activities of this service and have verified that the service and findings are accurately documented in the student's note. Enid Baas, MD

## 2017-01-23 NOTE — Assessment & Plan Note (Signed)
Today he has calcific quadriceps tendinopathy most prominent on right with fairly large spur That may require surgical removal  Left has minor calcifications and today shows a small hypoechoic area of a partial tear (0.5 cm long)  NTG to left Surgical consult on RT  [place]

## 2017-01-27 DIAGNOSIS — M25562 Pain in left knee: Secondary | ICD-10-CM | POA: Diagnosis not present

## 2017-01-27 DIAGNOSIS — M25561 Pain in right knee: Secondary | ICD-10-CM | POA: Diagnosis not present

## 2017-01-31 DIAGNOSIS — M25561 Pain in right knee: Secondary | ICD-10-CM | POA: Diagnosis not present

## 2017-01-31 DIAGNOSIS — M25562 Pain in left knee: Secondary | ICD-10-CM | POA: Diagnosis not present

## 2017-02-02 DIAGNOSIS — M25562 Pain in left knee: Secondary | ICD-10-CM | POA: Diagnosis not present

## 2017-02-02 DIAGNOSIS — M25561 Pain in right knee: Secondary | ICD-10-CM | POA: Diagnosis not present

## 2017-02-09 MED FILL — DOXYCYCLINE HYCLATE 100 MG: 100 | 14 days supply | Qty: 28 | Fill #0

## 2017-02-16 DIAGNOSIS — S83241A Other tear of medial meniscus, current injury, right knee, initial encounter: Secondary | ICD-10-CM | POA: Diagnosis not present

## 2017-02-16 DIAGNOSIS — M94262 Chondromalacia, left knee: Secondary | ICD-10-CM | POA: Diagnosis not present

## 2017-02-16 DIAGNOSIS — M66251 Spontaneous rupture of extensor tendons, right thigh: Secondary | ICD-10-CM | POA: Diagnosis not present

## 2017-02-16 DIAGNOSIS — G8918 Other acute postprocedural pain: Secondary | ICD-10-CM | POA: Diagnosis not present

## 2017-02-16 DIAGNOSIS — S83232A Complex tear of medial meniscus, current injury, left knee, initial encounter: Secondary | ICD-10-CM | POA: Diagnosis not present

## 2017-02-16 DIAGNOSIS — M65252 Calcific tendinitis, left thigh: Secondary | ICD-10-CM | POA: Diagnosis not present

## 2017-02-16 DIAGNOSIS — S83242A Other tear of medial meniscus, current injury, left knee, initial encounter: Secondary | ICD-10-CM | POA: Diagnosis not present

## 2017-02-16 DIAGNOSIS — M66252 Spontaneous rupture of extensor tendons, left thigh: Secondary | ICD-10-CM | POA: Diagnosis not present

## 2017-02-16 DIAGNOSIS — S83231A Complex tear of medial meniscus, current injury, right knee, initial encounter: Secondary | ICD-10-CM | POA: Diagnosis not present

## 2017-02-16 DIAGNOSIS — S83282A Other tear of lateral meniscus, current injury, left knee, initial encounter: Secondary | ICD-10-CM | POA: Diagnosis not present

## 2017-02-16 DIAGNOSIS — M65251 Calcific tendinitis, right thigh: Secondary | ICD-10-CM | POA: Diagnosis not present

## 2017-02-16 DIAGNOSIS — M94261 Chondromalacia, right knee: Secondary | ICD-10-CM | POA: Diagnosis not present

## 2017-02-16 MED FILL — CYCLOBENZAPRINE 5 MG TABLET: 5 | 7 days supply | Qty: 20 | Fill #0

## 2017-02-16 MED FILL — HYDROCODON-APAP 5-325: 5-325 | 3 days supply | Qty: 30 | Fill #0

## 2017-02-23 DIAGNOSIS — M66251 Spontaneous rupture of extensor tendons, right thigh: Secondary | ICD-10-CM | POA: Diagnosis not present

## 2017-02-25 DIAGNOSIS — M6281 Muscle weakness (generalized): Secondary | ICD-10-CM | POA: Diagnosis not present

## 2017-02-25 DIAGNOSIS — M25561 Pain in right knee: Secondary | ICD-10-CM | POA: Diagnosis not present

## 2017-02-25 DIAGNOSIS — M25661 Stiffness of right knee, not elsewhere classified: Secondary | ICD-10-CM | POA: Diagnosis not present

## 2017-02-25 DIAGNOSIS — M25562 Pain in left knee: Secondary | ICD-10-CM | POA: Diagnosis not present

## 2017-02-26 DIAGNOSIS — M66251 Spontaneous rupture of extensor tendons, right thigh: Secondary | ICD-10-CM | POA: Diagnosis not present

## 2017-03-03 DIAGNOSIS — M25561 Pain in right knee: Secondary | ICD-10-CM | POA: Diagnosis not present

## 2017-03-03 DIAGNOSIS — M25562 Pain in left knee: Secondary | ICD-10-CM | POA: Diagnosis not present

## 2017-03-03 DIAGNOSIS — M6281 Muscle weakness (generalized): Secondary | ICD-10-CM | POA: Diagnosis not present

## 2017-03-03 DIAGNOSIS — M25661 Stiffness of right knee, not elsewhere classified: Secondary | ICD-10-CM | POA: Diagnosis not present

## 2017-03-19 DIAGNOSIS — M25561 Pain in right knee: Secondary | ICD-10-CM | POA: Diagnosis not present

## 2017-03-24 DIAGNOSIS — M25661 Stiffness of right knee, not elsewhere classified: Secondary | ICD-10-CM | POA: Diagnosis not present

## 2017-03-24 DIAGNOSIS — M6281 Muscle weakness (generalized): Secondary | ICD-10-CM | POA: Diagnosis not present

## 2017-03-24 DIAGNOSIS — M25562 Pain in left knee: Secondary | ICD-10-CM | POA: Diagnosis not present

## 2017-03-24 DIAGNOSIS — M25561 Pain in right knee: Secondary | ICD-10-CM | POA: Diagnosis not present

## 2017-03-26 MED FILL — NITROGLYCERIN 0.2 MG/HR PTC: 0.2 | 30 days supply | Qty: 15 | Fill #4

## 2017-04-21 DIAGNOSIS — M25562 Pain in left knee: Secondary | ICD-10-CM | POA: Diagnosis not present

## 2017-04-21 DIAGNOSIS — M25561 Pain in right knee: Secondary | ICD-10-CM | POA: Diagnosis not present

## 2017-04-21 DIAGNOSIS — M6281 Muscle weakness (generalized): Secondary | ICD-10-CM | POA: Diagnosis not present

## 2017-04-21 DIAGNOSIS — M25661 Stiffness of right knee, not elsewhere classified: Secondary | ICD-10-CM | POA: Diagnosis not present

## 2017-04-23 DIAGNOSIS — M25561 Pain in right knee: Secondary | ICD-10-CM | POA: Diagnosis not present

## 2017-04-23 DIAGNOSIS — M6281 Muscle weakness (generalized): Secondary | ICD-10-CM | POA: Diagnosis not present

## 2017-04-23 DIAGNOSIS — M25562 Pain in left knee: Secondary | ICD-10-CM | POA: Diagnosis not present

## 2017-04-23 DIAGNOSIS — M25661 Stiffness of right knee, not elsewhere classified: Secondary | ICD-10-CM | POA: Diagnosis not present

## 2017-04-28 DIAGNOSIS — M6281 Muscle weakness (generalized): Secondary | ICD-10-CM | POA: Diagnosis not present

## 2017-04-28 DIAGNOSIS — M25661 Stiffness of right knee, not elsewhere classified: Secondary | ICD-10-CM | POA: Diagnosis not present

## 2017-04-28 DIAGNOSIS — M25562 Pain in left knee: Secondary | ICD-10-CM | POA: Diagnosis not present

## 2017-04-28 DIAGNOSIS — M25561 Pain in right knee: Secondary | ICD-10-CM | POA: Diagnosis not present

## 2017-04-30 DIAGNOSIS — M25562 Pain in left knee: Secondary | ICD-10-CM | POA: Diagnosis not present

## 2017-04-30 DIAGNOSIS — M25661 Stiffness of right knee, not elsewhere classified: Secondary | ICD-10-CM | POA: Diagnosis not present

## 2017-04-30 DIAGNOSIS — M6281 Muscle weakness (generalized): Secondary | ICD-10-CM | POA: Diagnosis not present

## 2017-04-30 DIAGNOSIS — M25561 Pain in right knee: Secondary | ICD-10-CM | POA: Diagnosis not present

## 2017-05-05 DIAGNOSIS — M25661 Stiffness of right knee, not elsewhere classified: Secondary | ICD-10-CM | POA: Diagnosis not present

## 2017-05-05 DIAGNOSIS — M25562 Pain in left knee: Secondary | ICD-10-CM | POA: Diagnosis not present

## 2017-05-05 DIAGNOSIS — M6281 Muscle weakness (generalized): Secondary | ICD-10-CM | POA: Diagnosis not present

## 2017-05-05 DIAGNOSIS — M25561 Pain in right knee: Secondary | ICD-10-CM | POA: Diagnosis not present

## 2017-05-12 DIAGNOSIS — M25562 Pain in left knee: Secondary | ICD-10-CM | POA: Diagnosis not present

## 2017-05-12 DIAGNOSIS — M25561 Pain in right knee: Secondary | ICD-10-CM | POA: Diagnosis not present

## 2017-05-12 DIAGNOSIS — M25661 Stiffness of right knee, not elsewhere classified: Secondary | ICD-10-CM | POA: Diagnosis not present

## 2017-05-12 DIAGNOSIS — M6281 Muscle weakness (generalized): Secondary | ICD-10-CM | POA: Diagnosis not present

## 2017-05-13 ENCOUNTER — Emergency Department (HOSPITAL_COMMUNITY): Payer: BLUE CROSS/BLUE SHIELD

## 2017-05-13 ENCOUNTER — Emergency Department (HOSPITAL_COMMUNITY)
Admission: EM | Admit: 2017-05-13 | Discharge: 2017-05-13 | Disposition: A | Discharge status: Home / Self Care | Payer: BLUE CROSS/BLUE SHIELD | Attending: Emergency Medicine | Admitting: Emergency Medicine

## 2017-05-13 ENCOUNTER — Ambulatory Visit (HOSPITAL_COMMUNITY)
Admission: EM | Admit: 2017-05-13 | Discharge: 2017-05-13 | Disposition: A | Discharge status: Nursing Home (Includes ICF) | Payer: BLUE CROSS/BLUE SHIELD | Source: Home / Self Care

## 2017-05-13 ENCOUNTER — Encounter (HOSPITAL_COMMUNITY): Payer: Self-pay | Admitting: Emergency Medicine

## 2017-05-13 DIAGNOSIS — S199XXA Unspecified injury of neck, initial encounter: Secondary | ICD-10-CM | POA: Diagnosis not present

## 2017-05-13 DIAGNOSIS — E785 Hyperlipidemia, unspecified: Secondary | ICD-10-CM | POA: Diagnosis not present

## 2017-05-13 DIAGNOSIS — S0990XA Unspecified injury of head, initial encounter: Secondary | ICD-10-CM | POA: Diagnosis present

## 2017-05-13 DIAGNOSIS — Y92007 Garden or yard of unspecified non-institutional (private) residence as the place of occurrence of the external cause: Secondary | ICD-10-CM | POA: Insufficient documentation

## 2017-05-13 DIAGNOSIS — Z7982 Long term (current) use of aspirin: Secondary | ICD-10-CM | POA: Insufficient documentation

## 2017-05-13 DIAGNOSIS — Y998 Other external cause status: Secondary | ICD-10-CM | POA: Diagnosis not present

## 2017-05-13 DIAGNOSIS — S22020A Wedge compression fracture of second thoracic vertebra, initial encounter for closed fracture: Secondary | ICD-10-CM | POA: Insufficient documentation

## 2017-05-13 DIAGNOSIS — S22000A Wedge compression fracture of unspecified thoracic vertebra, initial encounter for closed fracture: Secondary | ICD-10-CM | POA: Diagnosis not present

## 2017-05-13 DIAGNOSIS — Y9355 Activity, bike riding: Secondary | ICD-10-CM | POA: Insufficient documentation

## 2017-05-13 DIAGNOSIS — S299XXA Unspecified injury of thorax, initial encounter: Secondary | ICD-10-CM | POA: Diagnosis not present

## 2017-05-13 DIAGNOSIS — M542 Cervicalgia: Secondary | ICD-10-CM | POA: Diagnosis not present

## 2017-05-13 NOTE — ED Notes (Signed)
Patient transported to X-ray 
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
## 2017-05-13 NOTE — Discharge Instructions (Signed)
Please read attached information. If you experience any new or worsening signs or symptoms please return to the emergency room for evaluation. Please follow-up with your primary care provider or specialist as discussed.  °
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
## 2017-05-13 NOTE — ED Notes (Signed)
ED Provider at bedside. 

## 2017-05-21 DIAGNOSIS — S83241A Other tear of medial meniscus, current injury, right knee, initial encounter: Secondary | ICD-10-CM | POA: Diagnosis not present

## 2017-05-31 IMAGING — MR MR FOOT*L* W/O CM
4 of 5 series · 19 of 40 positions shown · non-contrast
Comparison: Radiographs 03/18/2016.

CLINICAL DATA: Marathon runner with foot pain for 3 weeks.

EXAM:
MRI OF THE LEFT FOREFOOT WITHOUT CONTRAST
TECHNIQUE: Multiplanar, multisequence MR imaging was performed. No intravenous
contrast was administered.

[Series 3: T1 · coronal · 4.0mm · 0.29mm/px · 10 of 41 slices shown (1 of 2)]
[im 1/41]
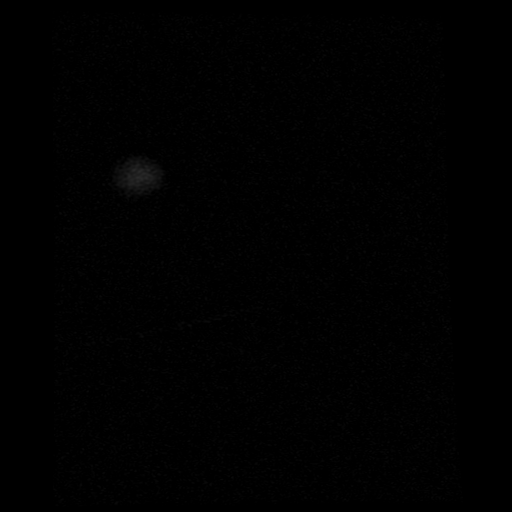
[im 5/41]
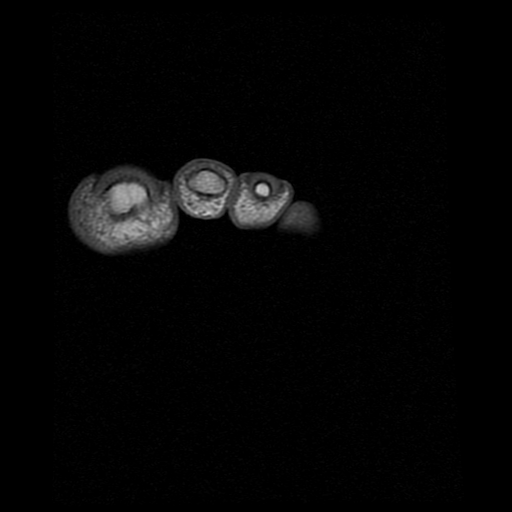
[im 9/41]
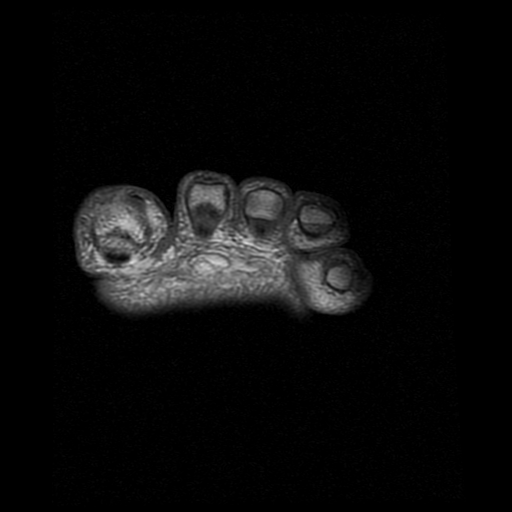
[im 13/41]
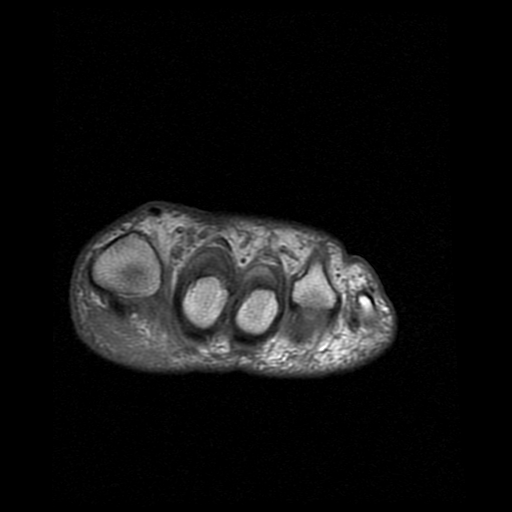
[im 17/41]
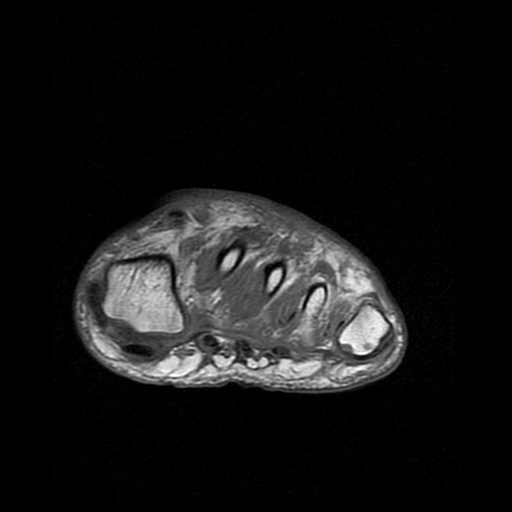
[im 21/41]
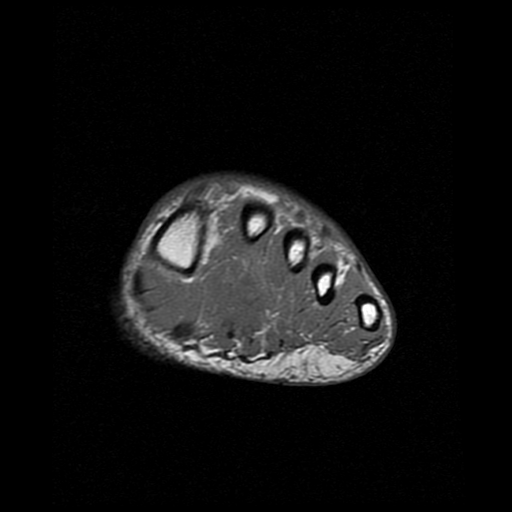
[im 25/41]
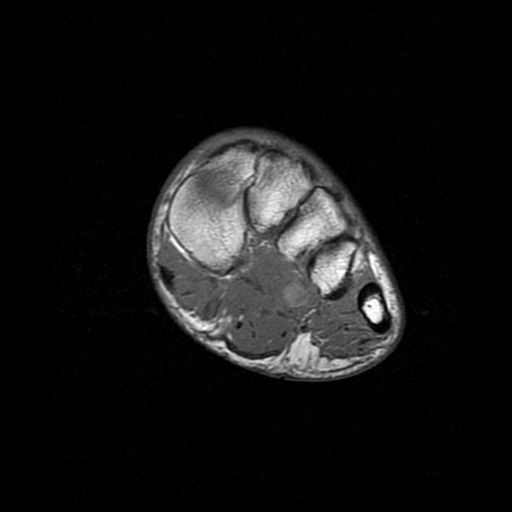
[im 29/41]
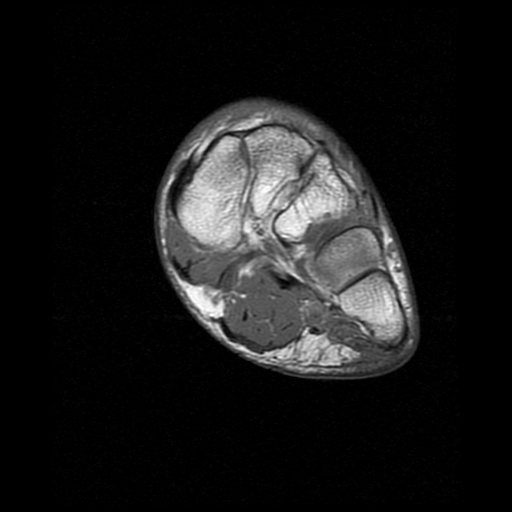
[im 33/41]
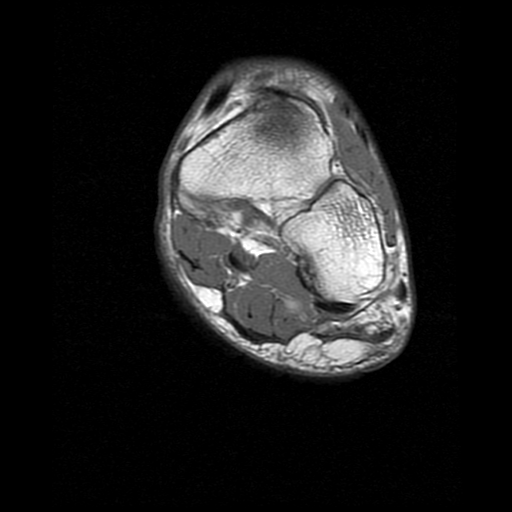
[im 37/41]
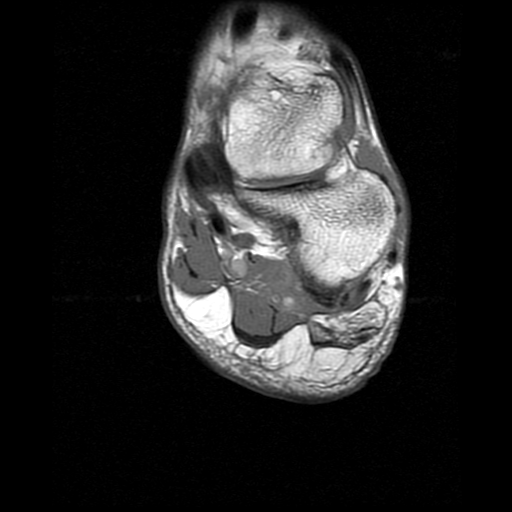

[Series 4: T2 · coronal · 4.0mm · 0.29mm/px · 3 of 41 slices shown (1 of 2)]
[im 5/41]
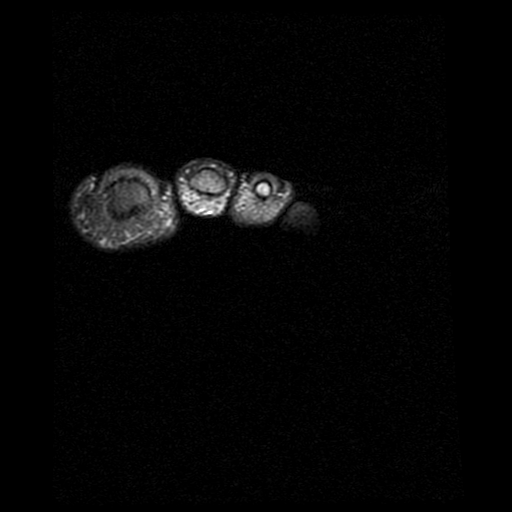
[im 21/41]
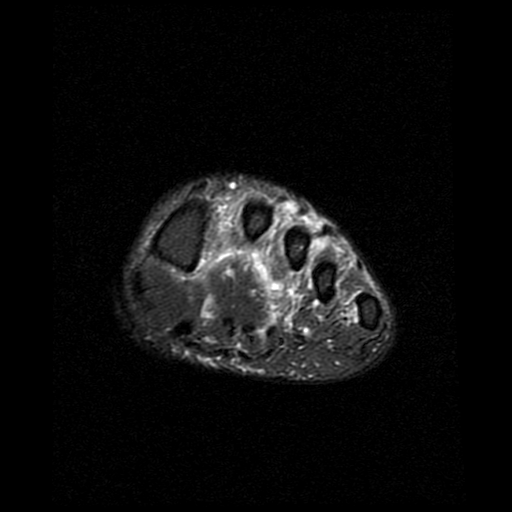
[im 37/41]
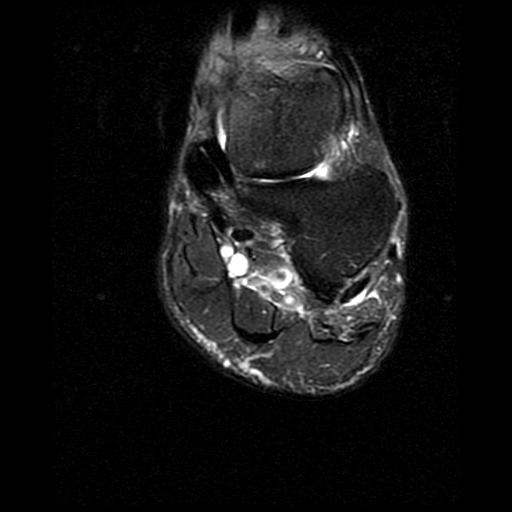

[Series 5: T1 · axial · 4.0mm · 0.35mm/px · z∈[-20,+51]mm · 3 of 21 slices shown (2 of 2)]
[im 5/21]
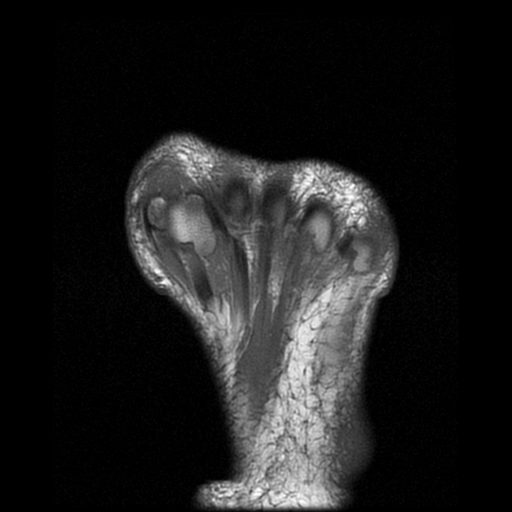
[im 13/21]
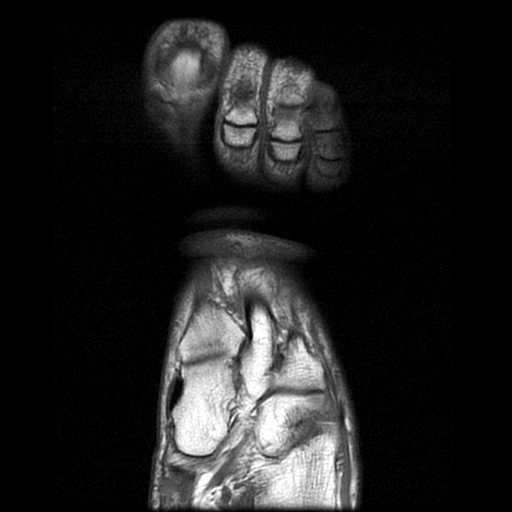
[im 21/21]
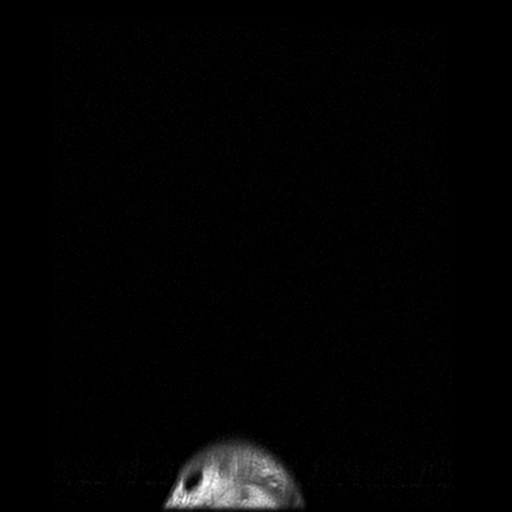

[Series 6: T2 · axial · 4.0mm · 0.35mm/px · z∈[-20,+51]mm · 3 of 21 slices shown (2 of 2)]
[im 5/21]
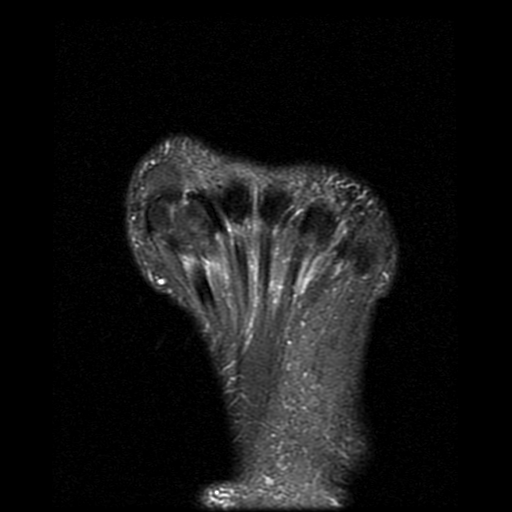
[im 13/21]
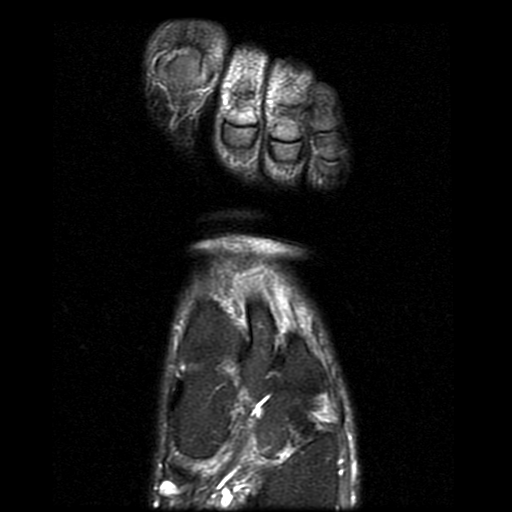
[im 21/21]
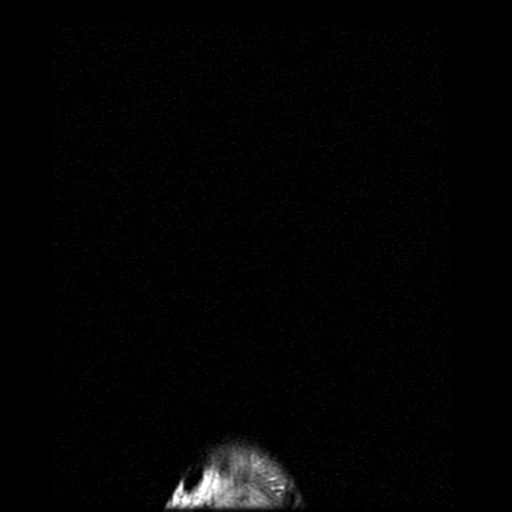

[19 of 40 positions shown; findings below may reference images not displayed]

FINDINGS: Bony structures are intact. No stress fractures identified. The
joint spaces are fairly well maintained without significant
degenerative changes. A small joint effusion is noted at the first
metatarsal phalangeal joint. No significant degenerative changes. No
findings for intermetatarsal bursitis. No findings for significant
tenosynovitis. There is nonspecific edema like signal abnormality in
the forefoot musculature likely reflecting an overuse
syndrome/myositis. I do not see a discrete muscle tear. The major
ligaments and tendons are intact. The visualized plantar fascia is
normal.
IMPRESSION: 1. No stress fracture or osteochondral abnormality.
2. No findings for inflammatory arthropathy. There is a small joint
effusion of the first metatarsal phalangeal joint.
3. Nonspecific inflammations/edema in the forefoot musculature. This
could be a stress related process/overuse syndrome or other
nonspecific muscle inflammatory process.
4. The major ligaments and tendons are intact.

## 2017-06-16 DIAGNOSIS — Z6825 Body mass index (BMI) 25.0-25.9, adult: Secondary | ICD-10-CM | POA: Diagnosis not present

## 2017-06-16 DIAGNOSIS — S22020A Wedge compression fracture of second thoracic vertebra, initial encounter for closed fracture: Secondary | ICD-10-CM | POA: Diagnosis not present

## 2017-06-16 DIAGNOSIS — R030 Elevated blood-pressure reading, without diagnosis of hypertension: Secondary | ICD-10-CM | POA: Diagnosis not present

## 2017-06-18 DIAGNOSIS — S83241D Other tear of medial meniscus, current injury, right knee, subsequent encounter: Secondary | ICD-10-CM | POA: Diagnosis not present

## 2017-06-25 ENCOUNTER — Ambulatory Visit (INDEPENDENT_AMBULATORY_CARE_PROVIDER_SITE_OTHER): Payer: BLUE CROSS/BLUE SHIELD | Admitting: Sports Medicine

## 2017-06-25 DIAGNOSIS — G8929 Other chronic pain: Secondary | ICD-10-CM

## 2017-06-25 DIAGNOSIS — M25562 Pain in left knee: Secondary | ICD-10-CM | POA: Diagnosis not present

## 2017-06-25 DIAGNOSIS — M25561 Pain in right knee: Secondary | ICD-10-CM | POA: Diagnosis not present
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
## 2017-06-25 NOTE — Assessment & Plan Note (Signed)
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

## 2017-07-03 ENCOUNTER — Other Ambulatory Visit: Payer: Self-pay | Admitting: Family Medicine

## 2017-07-03 DIAGNOSIS — I10 Essential (primary) hypertension: Secondary | ICD-10-CM

## 2017-07-03 DIAGNOSIS — Z125 Encounter for screening for malignant neoplasm of prostate: Secondary | ICD-10-CM

## 2017-07-03 DIAGNOSIS — Z79899 Other long term (current) drug therapy: Secondary | ICD-10-CM

## 2017-07-03 DIAGNOSIS — E785 Hyperlipidemia, unspecified: Secondary | ICD-10-CM

## 2017-07-03 DIAGNOSIS — Z0000 Encounter for general adult medical examination without abnormal findings: Secondary | ICD-10-CM

## 2017-07-06 ENCOUNTER — Other Ambulatory Visit: Payer: BLUE CROSS/BLUE SHIELD

## 2017-07-06 DIAGNOSIS — E785 Hyperlipidemia, unspecified: Secondary | ICD-10-CM | POA: Diagnosis not present

## 2017-07-06 DIAGNOSIS — Z125 Encounter for screening for malignant neoplasm of prostate: Secondary | ICD-10-CM

## 2017-07-06 DIAGNOSIS — I10 Essential (primary) hypertension: Secondary | ICD-10-CM

## 2017-07-06 DIAGNOSIS — Z0000 Encounter for general adult medical examination without abnormal findings: Secondary | ICD-10-CM

## 2017-07-06 DIAGNOSIS — Z79899 Other long term (current) drug therapy: Secondary | ICD-10-CM | POA: Diagnosis not present

## 2017-07-09 ENCOUNTER — Other Ambulatory Visit: Payer: Self-pay

## 2017-07-09 ENCOUNTER — Ambulatory Visit (INDEPENDENT_AMBULATORY_CARE_PROVIDER_SITE_OTHER): Payer: BLUE CROSS/BLUE SHIELD | Admitting: Family Medicine

## 2017-07-09 ENCOUNTER — Encounter: Payer: Self-pay | Admitting: Family Medicine

## 2017-07-09 VITALS — BP 132/70 | HR 44 | Temp 98.4°F | Resp 12 | Ht 75.0 in | Wt 203.0 lb

## 2017-07-09 DIAGNOSIS — Z23 Encounter for immunization: Secondary | ICD-10-CM

## 2017-07-09 DIAGNOSIS — Z0000 Encounter for general adult medical examination without abnormal findings: Secondary | ICD-10-CM | POA: Diagnosis not present
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
## 2017-07-09 NOTE — Addendum Note (Signed)
Addended by: Legrand RamsWILLIS, SANDY B on: 07/09/2017 04:21 PM   Modules accepted: Orders

## 2017-07-10 LAB — COMPLETE METABOLIC PANEL WITH GFR
AG Ratio: 2 (calc) (ref 1.0–2.5)
ALBUMIN MSPROF: 4.5 g/dL (ref 3.6–5.1)
ALT: 23 U/L (ref 9–46)
AST: 39 U/L — ABNORMAL HIGH (ref 10–35)
Alkaline phosphatase (APISO): 35 U/L — ABNORMAL LOW (ref 40–115)
BUN: 25 mg/dL (ref 7–25)
CALCIUM: 9.6 mg/dL (ref 8.6–10.3)
CO2: 26 mmol/L (ref 20–32)
CREATININE: 1.15 mg/dL (ref 0.70–1.25)
Chloride: 107 mmol/L (ref 98–110)
GFR, EST AFRICAN AMERICAN: 77 mL/min/{1.73_m2} (ref >=60)
GFR, EST NON AFRICAN AMERICAN: 66 mL/min/{1.73_m2} (ref >=60)
GLOBULIN: 2.3 g/dL (ref 1.9–3.7)
GLUCOSE: 100 mg/dL — AB (ref 65–99)
Potassium: 4.7 mmol/L (ref 3.5–5.3)
SODIUM: 141 mmol/L (ref 135–146)
TOTAL PROTEIN: 6.8 g/dL (ref 6.1–8.1)
Total Bilirubin: 1 mg/dL (ref 0.2–1.2)

## 2017-07-10 LAB — LIPID PANEL
CHOL/HDL RATIO: 1.8 (calc) (ref <5.0)
Cholesterol: 188 mg/dL (ref <200)
HDL: 103 mg/dL (ref >40)
LDL CHOLESTEROL (CALC): 73 mg/dL
NON-HDL CHOLESTEROL (CALC): 85 mg/dL (ref <130)
Triglycerides: 42 mg/dL (ref <150)

## 2017-07-10 LAB — CBC WITH DIFFERENTIAL/PLATELET
BASOS PCT: 0.7 %
Basophils Absolute: 22 cells/uL (ref 0–200)
EOS ABS: 40 {cells}/uL (ref 15–500)
EOS PCT: 1.3 %
HCT: 42.5 % (ref 38.5–50.0)
HEMOGLOBIN: 14.4 g/dL (ref 13.2–17.1)
Lymphs Abs: 1423 cells/uL (ref 850–3900)
MCH: 31 pg (ref 27.0–33.0)
MCHC: 33.9 g/dL (ref 32.0–36.0)
MCV: 91.4 fL (ref 80.0–100.0)
MONOS PCT: 9.8 %
MPV: 10.8 fL (ref 7.5–12.5)
NEUTROS ABS: 1311 {cells}/uL — AB (ref 1500–7800)
Neutrophils Relative %: 42.3 %
Platelets: 178 10*3/uL (ref 140–400)
RBC: 4.65 10*6/uL (ref 4.20–5.80)
RDW: 12.9 % (ref 11.0–15.0)
TOTAL LYMPHOCYTE: 45.9 %
WBC mixed population: 304 cells/uL (ref 200–950)
WBC: 3.1 10*3/uL — ABNORMAL LOW (ref 3.8–10.8)

## 2017-07-10 LAB — HEPATITIS C ANTIBODY
HEP C AB: NONREACTIVE
SIGNAL TO CUT-OFF: 0.01 (ref <1.00)

## 2017-07-10 LAB — PSA: PSA: 0.5 ng/mL (ref <=4.0)

## 2017-07-13 ENCOUNTER — Encounter: Payer: Self-pay | Admitting: Family Medicine

## 2017-07-15 MED FILL — NITROGLYCERIN 0.2 MG/HR PTC: 0.2 | 30 days supply | Qty: 15 | Fill #5

## 2017-09-10 DIAGNOSIS — M7042 Prepatellar bursitis, left knee: Secondary | ICD-10-CM | POA: Diagnosis not present

## 2017-09-10 DIAGNOSIS — M5432 Sciatica, left side: Secondary | ICD-10-CM | POA: Diagnosis not present

## 2017-09-10 DIAGNOSIS — M9903 Segmental and somatic dysfunction of lumbar region: Secondary | ICD-10-CM | POA: Diagnosis not present

## 2017-09-10 DIAGNOSIS — M9905 Segmental and somatic dysfunction of pelvic region: Secondary | ICD-10-CM | POA: Diagnosis not present

## 2017-09-14 DIAGNOSIS — M9905 Segmental and somatic dysfunction of pelvic region: Secondary | ICD-10-CM | POA: Diagnosis not present

## 2017-09-14 DIAGNOSIS — M5432 Sciatica, left side: Secondary | ICD-10-CM | POA: Diagnosis not present

## 2017-09-14 DIAGNOSIS — M9903 Segmental and somatic dysfunction of lumbar region: Secondary | ICD-10-CM | POA: Diagnosis not present

## 2017-09-14 DIAGNOSIS — M7042 Prepatellar bursitis, left knee: Secondary | ICD-10-CM | POA: Diagnosis not present

## 2017-09-17 DIAGNOSIS — M9903 Segmental and somatic dysfunction of lumbar region: Secondary | ICD-10-CM | POA: Diagnosis not present

## 2017-09-17 DIAGNOSIS — M7042 Prepatellar bursitis, left knee: Secondary | ICD-10-CM | POA: Diagnosis not present

## 2017-09-17 DIAGNOSIS — M5432 Sciatica, left side: Secondary | ICD-10-CM | POA: Diagnosis not present

## 2017-09-17 DIAGNOSIS — M9905 Segmental and somatic dysfunction of pelvic region: Secondary | ICD-10-CM | POA: Diagnosis not present

## 2017-09-21 DIAGNOSIS — M5432 Sciatica, left side: Secondary | ICD-10-CM | POA: Diagnosis not present

## 2017-09-21 DIAGNOSIS — M9905 Segmental and somatic dysfunction of pelvic region: Secondary | ICD-10-CM | POA: Diagnosis not present

## 2017-09-21 DIAGNOSIS — M7042 Prepatellar bursitis, left knee: Secondary | ICD-10-CM | POA: Diagnosis not present

## 2017-09-21 DIAGNOSIS — M9903 Segmental and somatic dysfunction of lumbar region: Secondary | ICD-10-CM | POA: Diagnosis not present

## 2017-09-24 DIAGNOSIS — M9905 Segmental and somatic dysfunction of pelvic region: Secondary | ICD-10-CM | POA: Diagnosis not present

## 2017-09-24 DIAGNOSIS — M5432 Sciatica, left side: Secondary | ICD-10-CM | POA: Diagnosis not present

## 2017-09-24 DIAGNOSIS — M7042 Prepatellar bursitis, left knee: Secondary | ICD-10-CM | POA: Diagnosis not present

## 2017-09-24 DIAGNOSIS — M9903 Segmental and somatic dysfunction of lumbar region: Secondary | ICD-10-CM | POA: Diagnosis not present

## 2017-09-28 DIAGNOSIS — M5432 Sciatica, left side: Secondary | ICD-10-CM | POA: Diagnosis not present

## 2017-09-28 DIAGNOSIS — M7042 Prepatellar bursitis, left knee: Secondary | ICD-10-CM | POA: Diagnosis not present

## 2017-09-28 DIAGNOSIS — M9905 Segmental and somatic dysfunction of pelvic region: Secondary | ICD-10-CM | POA: Diagnosis not present

## 2017-09-28 DIAGNOSIS — M9903 Segmental and somatic dysfunction of lumbar region: Secondary | ICD-10-CM | POA: Diagnosis not present

## 2017-10-01 DIAGNOSIS — M9903 Segmental and somatic dysfunction of lumbar region: Secondary | ICD-10-CM | POA: Diagnosis not present

## 2017-10-01 DIAGNOSIS — M5432 Sciatica, left side: Secondary | ICD-10-CM | POA: Diagnosis not present

## 2017-10-01 DIAGNOSIS — M7042 Prepatellar bursitis, left knee: Secondary | ICD-10-CM | POA: Diagnosis not present

## 2017-10-01 DIAGNOSIS — M9905 Segmental and somatic dysfunction of pelvic region: Secondary | ICD-10-CM | POA: Diagnosis not present

## 2017-10-05 DIAGNOSIS — M7042 Prepatellar bursitis, left knee: Secondary | ICD-10-CM | POA: Diagnosis not present

## 2017-10-05 DIAGNOSIS — M9905 Segmental and somatic dysfunction of pelvic region: Secondary | ICD-10-CM | POA: Diagnosis not present

## 2017-10-05 DIAGNOSIS — M9903 Segmental and somatic dysfunction of lumbar region: Secondary | ICD-10-CM | POA: Diagnosis not present

## 2017-10-05 DIAGNOSIS — M5432 Sciatica, left side: Secondary | ICD-10-CM | POA: Diagnosis not present

## 2017-10-08 DIAGNOSIS — M9903 Segmental and somatic dysfunction of lumbar region: Secondary | ICD-10-CM | POA: Diagnosis not present

## 2017-10-08 DIAGNOSIS — M5432 Sciatica, left side: Secondary | ICD-10-CM | POA: Diagnosis not present

## 2017-10-08 DIAGNOSIS — M9905 Segmental and somatic dysfunction of pelvic region: Secondary | ICD-10-CM | POA: Diagnosis not present

## 2017-10-08 DIAGNOSIS — M7042 Prepatellar bursitis, left knee: Secondary | ICD-10-CM | POA: Diagnosis not present

## 2017-10-12 DIAGNOSIS — M9903 Segmental and somatic dysfunction of lumbar region: Secondary | ICD-10-CM | POA: Diagnosis not present

## 2017-10-12 DIAGNOSIS — M9905 Segmental and somatic dysfunction of pelvic region: Secondary | ICD-10-CM | POA: Diagnosis not present

## 2017-10-12 DIAGNOSIS — M7042 Prepatellar bursitis, left knee: Secondary | ICD-10-CM | POA: Diagnosis not present

## 2017-10-12 DIAGNOSIS — M5432 Sciatica, left side: Secondary | ICD-10-CM | POA: Diagnosis not present

## 2017-10-15 DIAGNOSIS — M9905 Segmental and somatic dysfunction of pelvic region: Secondary | ICD-10-CM | POA: Diagnosis not present

## 2017-10-15 DIAGNOSIS — M7042 Prepatellar bursitis, left knee: Secondary | ICD-10-CM | POA: Diagnosis not present

## 2017-10-15 DIAGNOSIS — M5432 Sciatica, left side: Secondary | ICD-10-CM | POA: Diagnosis not present

## 2017-10-15 DIAGNOSIS — M9903 Segmental and somatic dysfunction of lumbar region: Secondary | ICD-10-CM | POA: Diagnosis not present

## 2017-10-24 ENCOUNTER — Other Ambulatory Visit: Payer: Self-pay | Admitting: Family Medicine

## 2017-10-24 DIAGNOSIS — E7800 Pure hypercholesterolemia, unspecified: Secondary | ICD-10-CM

## 2017-10-26 DIAGNOSIS — H20012 Primary iridocyclitis, left eye: Secondary | ICD-10-CM | POA: Diagnosis not present

## 2017-10-26 DIAGNOSIS — H25013 Cortical age-related cataract, bilateral: Secondary | ICD-10-CM | POA: Diagnosis not present

## 2017-10-26 DIAGNOSIS — H35372 Puckering of macula, left eye: Secondary | ICD-10-CM | POA: Diagnosis not present

## 2017-10-26 DIAGNOSIS — H35411 Lattice degeneration of retina, right eye: Secondary | ICD-10-CM | POA: Diagnosis not present

## 2017-10-27 MED FILL — DUREZOL 0.05% EYE DROPS: 0.05 | 22 days supply | Qty: 5 | Fill #0

## 2017-11-02 DIAGNOSIS — H20012 Primary iridocyclitis, left eye: Secondary | ICD-10-CM | POA: Diagnosis not present

## 2017-12-07 DIAGNOSIS — H20012 Primary iridocyclitis, left eye: Secondary | ICD-10-CM | POA: Diagnosis not present

## 2018-01-13 ENCOUNTER — Encounter: Payer: Self-pay | Admitting: Family Medicine

## 2018-01-14 ENCOUNTER — Other Ambulatory Visit: Payer: Self-pay

## 2018-01-14 ENCOUNTER — Encounter: Payer: Self-pay | Admitting: Family Medicine

## 2018-01-14 ENCOUNTER — Ambulatory Visit: Payer: BLUE CROSS/BLUE SHIELD | Admitting: Family Medicine

## 2018-01-14 VITALS — BP 138/72 | HR 60 | Temp 97.9°F | Resp 16 | Ht 75.0 in | Wt 199.0 lb

## 2018-01-14 DIAGNOSIS — J069 Acute upper respiratory infection, unspecified: Secondary | ICD-10-CM | POA: Diagnosis not present

## 2018-01-14 DIAGNOSIS — J40 Bronchitis, not specified as acute or chronic: Secondary | ICD-10-CM

## 2018-01-14 MED ORDER — BENZONATATE 100 MG PO CAPS: 100.0000 mg | ORAL_CAPSULE | Freq: Three times a day (TID) | ORAL | 0 refills | Status: DC | PRN

## 2018-01-14 MED ORDER — PREDNISONE 20 MG PO TABS: 40.0000 mg | ORAL_TABLET | Freq: Every day | ORAL | 0 refills | Status: AC

## 2018-01-14 MED ORDER — AZITHROMYCIN 250 MG PO TABS: ORAL_TABLET | ORAL | 0 refills | Status: DC

## 2018-01-14 MED ORDER — IPRATROPIUM-ALBUTEROL 0.5-2.5 (3) MG/3ML IN SOLN
3.0000 mL | Freq: Once | RESPIRATORY_TRACT | Status: AC
  Administered 2018-01-14: 3 mL via RESPIRATORY_TRACT

## 2018-01-14 MED FILL — predniSONE 20 MG TABS: 20 | 10 days supply | Qty: 10 | Fill #0

## 2018-01-14 MED FILL — BENZONATATE 100 MG CAPS: 100 | 10 days supply | Qty: 30 | Fill #0

## 2018-01-14 NOTE — Progress Notes (Signed)
Patient ID: Patrick Wall, male    DOB: 1950-09-21, 67 y.o.   MRN: [id number]  PCP: Donita Brooks, MD  Chief Complaint  Patient presents with  . Illness    x1 month- sore throat, swollen gland on R side, productive cough with clear sputum while on vacation- now has cough and chest congestion    Subjective:   Patrick Wall is a 67 y.o. male, presents to clinic with CC of 1 month of intermittent URI symptoms that has worsened over the past week with chest congestion and productive cough with clear sputum.  He first got sick when he was traveling with a large group of people to [place] and [place].  Several people on the trip or ill with colds cough and GI viruses.  He had a little bit of scratchy throat swollen lymph node on his right side, some nasal drainage that would last for a few days at a time and then resolve, then few days or week later come back.  Couple weeks ago they got back into town and his wife got a cold and then he started to feel ill again he currently does have that right lymph node that is still swollen and tender just under his jawline but he denies any nasal congestion, facial or sinus pain, headaches fever.  He denies any nasal discharge or postnasal drip.  His main complaint is his cough which has become more severe with longer, tiring coughing fits.  5 days ago he did a duathelon and he felt fine but he did have a mild cough and it has gradually worsened since then and yesterday had to leave work because of uncontrollable coughing.  He tried one Mucinex pill one time but then ran out and he has not tried any other over-the-counter medications.  His chest discomfort is described as congestion but he denies tightness, pain, wheeze or shortness of breath.  He denies weight loss, rash, sweats, chills, palpitations, lower extremity edema, orthopnea, PND, near-syncope, fatigue, malaise or weakness.  He has associated raspy voice.  His cough does seem to be worse first thing in the  morning No other aggravating or alleviating factors.  He also mentions having worse hearing lately, but does have some baseline hearing loss.  States that his left ear feels more blocked than usual, has some popping.  No drainage or pain.   Patient Active Problem List   Diagnosis Date Noted  . Bilateral knee pain 06/03/2016  . Abnormality of gait 06/03/2016  . Left foot pain 03/18/2016  . Labral tear of shoulder   . Hyperlipidemia   . Hypertension   . Hearing loss   . 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 Authorizing Provider  atorvastatin (LIPITOR) 10 MG tablet TAKE 1 TABLET BY MOUTH DAILY 10/26/17  Yes Donita Brooks, MD  Multiple Vitamin (MULTIVITAMIN) capsule Take 1 capsule by mouth daily.   Yes [provider]  nitroGLYCERIN (NITRODUR - DOSED IN MG/24 HR) 0.2 mg/hr patch Place 1/4 to 1/2 patch on affected area daily 09/23/16  Yes Enid Baas, MD     Allergies  Allergen Reactions  . Sulfa Antibiotics      Family History  Problem Relation Age of Onset  . Cancer Mother        skin  . Heart disease Mother   . Vision loss Mother   . Early death Father   .  Arthritis Maternal Grandmother   . Cancer Maternal Grandmother   . Hyperlipidemia Maternal Grandmother   . Hypertension Maternal Grandmother   . Cancer Maternal Grandfather   . Heart disease Paternal Grandfather   . Stroke Paternal Grandfather      Social History   Socioeconomic History  . Marital status: Married    Spouse name: Not on file  . Number of children: Not on file  . Years of education: Not on file  . Highest education level: Not on file  Occupational History  . Not on file  Social Needs  . Financial resource strain: Not on file  . Food insecurity:    Worry: Not on file    Inability: Not on file  . Transportation needs:    Medical: Not on file    Non-medical: Not on file  Tobacco Use  . Smoking  status: Never Smoker  . Smokeless tobacco: Never Used  Substance and Sexual Activity  . Alcohol use: Yes    Alcohol/week: 5.4 oz    Types: 5 Glasses of wine, 4 Cans of beer per week  . Drug use: No  . Sexual activity: Yes    Birth control/protection: Surgical    Comment: vasectomy  Lifestyle  . Physical activity:    Days per week: Not on file    Minutes per session: Not on file  . Stress: Not on file  Relationships  . Social connections:    Talks on phone: Not on file    Gets together: Not on file    Attends religious service: Not on file    Active member of club or organization: Not on file    Attends meetings of clubs or organizations: Not on file    Relationship status: Not on file  . Intimate partner violence:    Fear of current or ex partner: Not on file    Emotionally abused: Not on file    Physically abused: Not on file    Forced sexual activity: Not on file  Other Topics Concern  . Not on file  Social History Narrative  . Not on file     Review of Systems  Constitutional: Negative.  Negative for activity change, appetite change, chills, diaphoresis, fatigue, fever and unexpected weight change.  HENT: Positive for voice change. Negative for congestion, drooling, ear discharge, ear pain, postnasal drip, rhinorrhea, sinus pressure, sinus pain, sneezing, sore throat, tinnitus and trouble swallowing.   Eyes: Negative.  Negative for pain, discharge, redness and itching.  Respiratory: Positive for cough. Negative for apnea, choking, chest tightness, shortness of breath, wheezing and stridor.   Cardiovascular: Negative.  Negative for chest pain, palpitations and leg swelling.  Gastrointestinal: Negative.  Negative for abdominal distention, abdominal pain, anal bleeding, blood in stool, constipation, diarrhea, nausea, rectal pain and vomiting.  Endocrine: Negative.   Genitourinary: Negative.   Musculoskeletal: Negative.  Negative for back pain.  Skin: Negative.  Negative  for color change, pallor and rash.  Allergic/Immunologic: Negative.   Neurological: Negative.  Negative for dizziness, tremors, syncope, weakness, light-headedness, numbness and headaches.  Hematological: Negative.   Psychiatric/Behavioral: Negative.   All other systems reviewed and are negative.      Objective:    Vitals:   01/14/18 0806  BP: 138/72  Pulse: 60  Resp: 16  Temp: 97.9 F (36.6 C)  TempSrc: Oral  SpO2: 97%  Weight: 199 lb (90.3 kg)  Height:  (1.905 m)      Physical Exam  Constitutional: He  is oriented to person, place, and time. He appears well-developed and well-nourished.  Non-toxic appearance. He does not appear ill. No distress.  Well-appearing male, appears stated age, mildly diaphoretic with sticky feeling skin, no distress, nontoxic-appearing  HENT:  Head: Normocephalic and atraumatic.  Right Ear: Tympanic membrane, external ear and ear canal normal.  Left Ear: Tympanic membrane, external ear and ear canal normal.  Nose: No mucosal edema or rhinorrhea. Right sinus exhibits no maxillary sinus tenderness and no frontal sinus tenderness. Left sinus exhibits no maxillary sinus tenderness and no frontal sinus tenderness.  Mouth/Throat: Uvula is midline. No trismus in the jaw. No uvula swelling. No oropharyngeal exudate, posterior oropharyngeal edema or posterior oropharyngeal erythema.  Bilateral external auditory canals and tympanic membranes normal Sinus tenderness to palpation Nasal mucosa mildly edematous with moderate erythema and moderate clear discharge Posterior oropharynx normal-appearing without edema, erythema or exudate Mucous membranes moist no pallor or cyanosis  Eyes: Pupils are equal, round, and reactive to light. Conjunctivae, EOM and lids are normal. Right eye exhibits no discharge. Left eye exhibits no discharge. No scleral icterus.  Neck: Trachea normal, normal range of motion and phonation normal. Neck supple. No JVD present. No  tracheal deviation present.  Cardiovascular: Normal rate, regular rhythm, normal heart sounds, intact distal pulses and normal pulses. Exam reveals no gallop and no friction rub.  No murmur heard. Pulses:      Radial pulses are 2+ on the right side, and 2+ on the left side.       Posterior tibial pulses are 2+ on the right side, and 2+ on the left side.  No lower extremity edema  Pulmonary/Chest: Effort normal. No accessory muscle usage or stridor. No tachypnea. No respiratory distress. He has no decreased breath sounds. He has no wheezes. He has no rales. He exhibits no tenderness.  Scattered rhonchi and transmitted upper airway sounds, very mildly diminished bilaterally at the bases, no wheeze, no crackles  Abdominal: Soft. Normal appearance and bowel sounds are normal. He exhibits no distension and no mass. There is no tenderness. There is no rebound and no guarding.  Musculoskeletal: Normal range of motion. He exhibits no edema.  Lymphadenopathy:    He has no cervical adenopathy.  Neurological: He is alert and oriented to person, place, and time. He exhibits normal muscle tone. Coordination and gait normal.  Skin: Skin is warm and intact. Capillary refill takes less than 2 seconds. No rash noted. He is diaphoretic. No erythema. No pallor.  Psychiatric: He has a normal mood and affect. His speech is normal and behavior is normal.  Nursing note and vitals reviewed.         Assessment & Plan:      ICD-10-CM   1. Bronchitis J40 predniSONE (DELTASONE) 20 MG tablet    benzonatate (TESSALON) 100 MG capsule    ipratropium-albuterol (DUONEB) 0.5-2.5 (3) MG/3ML nebulizer solution 3 mL  2. Upper respiratory tract infection, unspecified type J06.9     Patient with a month of intermittent URI symptoms and 1 week of worsening cough which is productive with clear sputum.  Nontoxic-appearing male without respiratory distress he was mildly diaphoretic, had no increased work of breathing, his vital  signs were stable and reassuring.  Noted congestion on pulmonary exam, improved after breathing treatment, lungs were examined were clear to auscultation anteriorly and posteriorly without wheeze, rales or rhonchi.  We will treat bronchitis as described above.  Because of his international travel recently and prolonged symptoms, patient was to  start treatment above and if he did not start to feel mostly improved in 5 days he was to start Z-Pak prescription which was printed for him, currently to hold.  No concern for any other causes of cough, no weight loss, no hemoptysis, no concerning cardiac symptoms.  Patient agrees to plan, left clinic in good condition with stable vital signs   Danelle Berry, PA-C 01/14/18 8:29 AM

## 2018-01-14 NOTE — Patient Instructions (Addendum)
Get Mucinex and take daily for the next 5-7days.  Increase the amount of clear fluids you drink.   Start the steroid meds for 5 days - this is to decrease inflammation and congestion in your chest. Albuterol as needed for coughing fits, any wheeze, chest tightness or shortness of breath. Tessalon as needed to help suppress your cough.  If you do not start to feel mostly better in 5 days, I would start the z-pak antibiotics.  Your only risk for a bacterial infection is the international travel and prolonged symptoms.  If you feel better, there is not need to take the Zpak.    Acute Bronchitis, Adult Acute bronchitis is sudden (acute) swelling of the air tubes (bronchi) in the lungs. Acute bronchitis causes these tubes to fill with mucus, which can make it hard to breathe. It can also cause coughing or wheezing. In adults, acute bronchitis usually goes away within 2 weeks. A cough caused by bronchitis may last up to 3 weeks. Smoking, allergies, and asthma can make the condition worse. Repeated episodes of bronchitis may cause further lung problems, such as chronic obstructive pulmonary disease (COPD). What are the causes? This condition can be caused by germs and by substances that irritate the lungs, including:  Cold and flu viruses. This condition is most often caused by the same virus that causes a cold.  Bacteria.  Exposure to tobacco smoke, dust, fumes, and air pollution.  What increases the risk? This condition is more likely to develop in people who:  Have close contact with someone with acute bronchitis.  Are exposed to lung irritants, such as tobacco smoke, dust, fumes, and vapors.  Have a weak immune system.  Have a respiratory condition such as asthma.  What are the signs or symptoms? Symptoms of this condition include:  A cough.  Coughing up clear, yellow, or green mucus.  Wheezing.  Chest congestion.  Shortness of breath.  A fever.  Body  aches.  Chills.  A sore throat.  How is this diagnosed? This condition is usually diagnosed with a physical exam. During the exam, your health care provider may order tests, such as chest X-rays, to rule out other conditions. He or she may also:  Test a sample of your mucus for bacterial infection.  Check the level of oxygen in your blood. This is done to check for pneumonia.  Do a chest X-ray or lung function testing to rule out pneumonia and other conditions.  Perform blood tests.  Your health care provider will also ask about your symptoms and medical history. How is this treated? Most cases of acute bronchitis clear up over time without treatment. Your health care provider may recommend:  Drinking more fluids. Drinking more makes your mucus thinner, which may make it easier to breathe.  Taking a medicine for a fever or cough.  Taking an antibiotic medicine.  Using an inhaler to help improve shortness of breath and to control a cough.  Using a cool mist vaporizer or humidifier to make it easier to breathe.  Follow these instructions at home: Medicines  Take over-the-counter and prescription medicines only as told by your health care provider.  If you were prescribed an antibiotic, take it as told by your health care provider. Do not stop taking the antibiotic even if you start to feel better. General instructions  Get plenty of rest.  Drink enough fluids to keep your urine clear or pale yellow.  Avoid smoking and secondhand smoke. Exposure to cigarette  smoke or irritating chemicals will make bronchitis worse. If you smoke and you need help quitting, ask your health care provider. Quitting smoking will help your lungs heal faster.  Use an inhaler, cool mist vaporizer, or humidifier as told by your health care provider.  Keep all follow-up visits as told by your health care provider. This is important. How is this prevented? To lower your risk of getting this  condition again:  Wash your hands often with soap and water. If soap and water are not available, use hand sanitizer.  Avoid contact with people who have cold symptoms.  Try not to touch your hands to your mouth, nose, or eyes.  Make sure to get the flu shot every year.  Contact a health care provider if:  Your symptoms do not improve in 2 weeks of treatment. Get help right away if:  You cough up blood.  You have chest pain.  You have severe shortness of breath.  You become dehydrated.  You faint or keep feeling like you are going to faint.  You keep vomiting.  You have a severe headache.  Your fever or chills gets worse. This information is not intended to replace advice given to you by your health care provider. Make sure you discuss any questions you have with your health care provider. Document Released: 09/25/2004 Document Revised: 03/12/2016 Document Reviewed: 02/06/2016 Elsevier Interactive Patient Education  2018 [employer]  Upper Respiratory Infection, Adult Most upper respiratory infections (URIs) are caused by a virus. A URI affects the nose, throat, and upper air passages. The most common type of URI is often called "the common cold." Follow these instructions at home:  Take medicines only as told by your doctor.  Gargle warm saltwater or take cough drops to comfort your throat as told by your doctor.  Use a warm mist humidifier or inhale steam from a shower to increase air moisture. This may make it easier to breathe.  Drink enough fluid to keep your pee (urine) clear or pale yellow.  Eat soups and other clear broths.  Have a healthy diet.  Rest as needed.  Go back to work when your fever is gone or your doctor says it is okay. ? You may need to stay home longer to avoid giving your URI to others. ? You can also wear a face mask and wash your hands often to prevent spread of the virus.  Use your inhaler more if you have asthma.  Do not use  any tobacco products, including cigarettes, chewing tobacco, or electronic cigarettes. If you need help quitting, ask your doctor. Contact a doctor if:  You are getting worse, not better.  Your symptoms are not helped by medicine.  You have chills.  You are getting more short of breath.  You have brown or red mucus.  You have yellow or brown discharge from your nose.  You have pain in your face, especially when you bend forward.  You have a fever.  You have puffy (swollen) neck glands.  You have pain while swallowing.  You have white areas in the back of your throat. Get help right away if:  You have very bad or constant: ? Headache. ? Ear pain. ? Pain in your forehead, behind your eyes, and over your cheekbones (sinus pain). ? Chest pain.  You have long-lasting (chronic) lung disease and any of the following: ? Wheezing. ? Long-lasting cough. ? Coughing up blood. ? A change in your usual mucus.  You  have a stiff neck.  You have changes in your: ? Vision. ? Hearing. ? Thinking. ? Mood. This information is not intended to replace advice given to you by your health care provider. Make sure you discuss any questions you have with your health care provider. Document Released: 02/04/2008 Document Revised: 04/20/2016 Document Reviewed: 11/23/2013 Elsevier Interactive Patient Education  2018 [employer].

## 2018-01-15 ENCOUNTER — Other Ambulatory Visit: Payer: Self-pay | Admitting: Family Medicine

## 2018-01-15 DIAGNOSIS — E7800 Pure hypercholesterolemia, unspecified: Secondary | ICD-10-CM

## 2018-01-18 MED FILL — AZITHROMYCIN 250 MG TABLET: 250 | 5 days supply | Qty: 6 | Fill #0

## 2018-01-28 ENCOUNTER — Encounter: Payer: Self-pay | Admitting: Family Medicine

## 2018-01-28 ENCOUNTER — Ambulatory Visit: Payer: BLUE CROSS/BLUE SHIELD | Admitting: Family Medicine

## 2018-01-28 VITALS — BP 140/88 | HR 48 | Temp 97.8°F | Resp 16 | Ht 75.0 in | Wt 199.0 lb

## 2018-01-28 DIAGNOSIS — J40 Bronchitis, not specified as acute or chronic: Secondary | ICD-10-CM | POA: Diagnosis not present

## 2018-01-28 MED ORDER — HYDROCODONE-HOMATROPINE 5-1.5 MG/5ML PO SYRP: 5.0000 mL | ORAL_SOLUTION | Freq: Three times a day (TID) | ORAL | 0 refills | Status: DC | PRN

## 2018-01-28 MED FILL — HYDROCODONE-HOMATROPINE SYR: 5-1.5 | 8 days supply | Qty: 120 | Fill #0

## 2018-01-28 NOTE — Progress Notes (Signed)
Subjective:    Patient ID: Patrick Wall, male    DOB: Sep 10, 1950, 66 y.o.   MRN: [id number]  HPI Patient has a chronic cough now for more than a month.  Originally got sick while in [place] running a triathlon.  Cough is been productive of yellow and clear mucus now for more than 4 weeks.  Denies fevers but reports significant fatigue.  Cough is present throughout the day.  He is tried a Z-Pak.  He is tried prednisone.  He is tried [employer].  He is tried [employer].  He is even tried prednisone with no relief.  He denies any chest pain.  He denies any pleurisy.  He denies any shortness of breath.  He basically wants reassurance that nothing else is wrong.  He does report significant fatigue.  He feels rundown.  He denies any nausea vomiting diarrhea.  He denies any weight loss or melena.  He denies any night sweats, shaking rigors, muscle aches.  Past Medical History:  Diagnosis Date  . Hearing loss   . Hyperlipidemia   . Labral tear of shoulder    Past Surgical History:  Procedure Laterality Date  . EYE SURGERY  2000   lasic  . VASECTOMY  1983   Current Outpatient Medications on File Prior to Visit  Medication Sig Dispense Refill  . atorvastatin (LIPITOR) 10 MG tablet TAKE 1 TABLET BY MOUTH DAILY 90 tablet 0  . Multiple Vitamin (MULTIVITAMIN) capsule Take 1 capsule by mouth daily.    . nitroGLYCERIN (NITRODUR - DOSED IN MG/24 HR) 0.2 mg/hr patch Place 1/4 to 1/2 patch on affected area daily 30 patch 2   No current facility-administered medications on file prior to visit.    Allergies  Allergen Reactions  . Sulfa Antibiotics    Social History   Socioeconomic History  . Marital status: Married    Spouse name: Not on file  . Number of children: Not on file  . Years of education: Not on file  . Highest education level: Not on file  Occupational History  . Not on file  Social Needs  . Financial resource strain: Not on file  . Food insecurity:    Worry: Not on file   Inability: Not on file  . Transportation needs:    Medical: Not on file    Non-medical: Not on file  Tobacco Use  . Smoking status: Never Smoker  . Smokeless tobacco: Never Used  Substance and Sexual Activity  . Alcohol use: Yes    Alcohol/week: 5.4 oz    Types: 5 Glasses of wine, 4 Cans of beer per week  . Drug use: No  . Sexual activity: Yes    Birth control/protection: Surgical    Comment: vasectomy  Lifestyle  . Physical activity:    Days per week: Not on file    Minutes per session: Not on file  . Stress: Not on file  Relationships  . Social connections:    Talks on phone: Not on file    Gets together: Not on file    Attends religious service: Not on file    Active member of club or organization: Not on file    Attends meetings of clubs or organizations: Not on file    Relationship status: Not on file  . Intimate partner violence:    Fear of current or ex partner: Not on file    Emotionally abused: Not on file    Physically abused: Not on file  Forced sexual activity: Not on file  Other Topics Concern  . Not on file  Social History Narrative  . Not on file      Review of Systems  All other systems reviewed and are negative.      Objective:   Physical Exam  Constitutional: He appears well-developed and well-nourished. No distress.  HENT:  Right Ear: External ear normal.  Left Ear: External ear normal.  Nose: Nose normal.  Mouth/Throat: Oropharynx is clear and moist. No oropharyngeal exudate.  Eyes: Pupils are equal, round, and reactive to light. Conjunctivae are normal.  Neck: Normal range of motion. No JVD present. No tracheal deviation present. No thyromegaly present.  Cardiovascular: Normal rate, regular rhythm, normal heart sounds and intact distal pulses. Exam reveals no gallop and no friction rub.  No murmur heard. Pulmonary/Chest: Effort normal and breath sounds normal. No stridor. No respiratory distress. He has no wheezes. He has no rales.    Abdominal: Soft. Bowel sounds are normal. He exhibits no distension and no mass. There is no tenderness. There is no rebound and no guarding.  Musculoskeletal: He exhibits no edema.  Lymphadenopathy:    He has no cervical adenopathy.  Skin: No rash noted. He is not diaphoretic. No erythema. No pallor.  Vitals reviewed.         Assessment & Plan:  Bronchitis - Plan: CBC with Differential/Platelet, COMPLETE METABOLIC PANEL WITH GFR, DG Chest 2 View  Suspect viral bronchitis.  Exam is reassuring.  Begin by obtaining baseline lab work including a CBC, CMP.  Obtain chest x-ray given his recent international travel.  If labs and x-ray are normal, recommend continued tincture of time.  Use Hycodan 1 teaspoon every 6 hours as needed for cough.  Obviously if labs or x-ray are abnormal, proceed with treatment depending upon the abnormalities.  I suspect self-limited resolution over the next 1 to 2 weeks

## 2018-01-29 ENCOUNTER — Encounter (INDEPENDENT_AMBULATORY_CARE_PROVIDER_SITE_OTHER): Payer: Self-pay

## 2018-01-29 ENCOUNTER — Encounter: Payer: Self-pay | Admitting: Family Medicine

## 2018-01-29 ENCOUNTER — Ambulatory Visit
Admission: RE | Admit: 2018-01-29 | Discharge: 2018-01-29 | Disposition: A | Discharge status: Home / Self Care | Payer: BLUE CROSS/BLUE SHIELD | Source: Ambulatory Visit | Attending: Family Medicine | Admitting: Family Medicine

## 2018-01-29 DIAGNOSIS — J40 Bronchitis, not specified as acute or chronic: Secondary | ICD-10-CM

## 2018-01-29 DIAGNOSIS — R05 Cough: Secondary | ICD-10-CM | POA: Diagnosis not present

## 2018-01-29 LAB — COMPLETE METABOLIC PANEL WITH GFR
AG Ratio: 1.9 (calc) (ref 1.0–2.5)
ALKALINE PHOSPHATASE (APISO): 34 U/L — AB (ref 40–115)
ALT: 23 U/L (ref 9–46)
AST: 26 U/L (ref 10–35)
Albumin: 4.3 g/dL (ref 3.6–5.1)
BILIRUBIN TOTAL: 0.8 mg/dL (ref 0.2–1.2)
BUN: 19 mg/dL (ref 7–25)
CHLORIDE: 103 mmol/L (ref 98–110)
CO2: 23 mmol/L (ref 20–32)
CREATININE: 1.14 mg/dL (ref 0.70–1.25)
Calcium: 9.3 mg/dL (ref 8.6–10.3)
GFR, Est African American: 78 mL/min/{1.73_m2} (ref >=60)
GFR, Est Non African American: 67 mL/min/{1.73_m2} (ref >=60)
GLUCOSE: 86 mg/dL (ref 65–99)
Globulin: 2.3 g/dL (calc) (ref 1.9–3.7)
Potassium: 4.1 mmol/L (ref 3.5–5.3)
SODIUM: 137 mmol/L (ref 135–146)
Total Protein: 6.6 g/dL (ref 6.1–8.1)

## 2018-01-29 LAB — CBC WITH DIFFERENTIAL/PLATELET
BASOS ABS: 8 {cells}/uL (ref 0–200)
Basophils Relative: 0.2 %
EOS ABS: 41 {cells}/uL (ref 15–500)
EOS PCT: 1 %
HCT: 41.6 % (ref 38.5–50.0)
Hemoglobin: 14.6 g/dL (ref 13.2–17.1)
Lymphs Abs: 1927 cells/uL (ref 850–3900)
MCH: 31.4 pg (ref 27.0–33.0)
MCHC: 35.1 g/dL (ref 32.0–36.0)
MCV: 89.5 fL (ref 80.0–100.0)
MONOS PCT: 10.7 %
MPV: 11.1 fL (ref 7.5–12.5)
NEUTROS PCT: 41.1 %
Neutro Abs: 1685 cells/uL (ref 1500–7800)
Platelets: 159 10*3/uL (ref 140–400)
RBC: 4.65 10*6/uL (ref 4.20–5.80)
RDW: 12.8 % (ref 11.0–15.0)
Total Lymphocyte: 47 %
WBC mixed population: 439 cells/uL (ref 200–950)
WBC: 4.1 10*3/uL (ref 3.8–10.8)

## 2018-02-12 ENCOUNTER — Encounter: Payer: Self-pay | Admitting: Family Medicine

## 2018-02-17 ENCOUNTER — Encounter: Payer: Self-pay | Admitting: Family Medicine

## 2018-02-17 ENCOUNTER — Ambulatory Visit: Payer: BLUE CROSS/BLUE SHIELD | Admitting: Family Medicine

## 2018-02-17 VITALS — BP 118/76 | HR 44 | Temp 97.7°F | Resp 12 | Ht 75.0 in | Wt 199.0 lb

## 2018-02-17 DIAGNOSIS — R748 Abnormal levels of other serum enzymes: Secondary | ICD-10-CM | POA: Diagnosis not present

## 2018-02-17 DIAGNOSIS — R7989 Other specified abnormal findings of blood chemistry: Principal | ICD-10-CM

## 2018-02-17 DIAGNOSIS — R778 Other specified abnormalities of plasma proteins: Secondary | ICD-10-CM

## 2018-02-17 NOTE — Progress Notes (Signed)
Subjective:    Patient ID: Patrick Wall, male    DOB: 1951-03-25, 67 y.o.   MRN: [id number]  HPI  01/28/18 Patient has a chronic cough now for more than a month.  Originally got sick while in [place] running a triathlon.  Cough is been productive of yellow and clear mucus now for more than 4 weeks.  Denies fevers but reports significant fatigue.  Cough is present throughout the day.  He is tried a Z-Pak.  He is tried prednisone.  He is tried [employer].  He is tried [employer].  He is even tried prednisone with no relief.  He denies any chest pain.  He denies any pleurisy.  He denies any shortness of breath.  He basically wants reassurance that nothing else is wrong.  He does report significant fatigue.  He feels rundown.  He denies any nausea vomiting diarrhea.  He denies any weight loss or melena.  He denies any night sweats, shaking rigors, muscle aches.  At that time, my plan was: Suspect viral bronchitis.  Exam is reassuring.  Begin by obtaining baseline lab work including a CBC, CMP.  Obtain chest x-ray given his recent international travel.  If labs and x-ray are normal, recommend continued tincture of time.  Use Hycodan 1 teaspoon every 6 hours as needed for cough.  Obviously if labs or x-ray are abnormal, proceed with treatment depending upon the abnormalities.  I suspect self-limited resolution over the next 1 to 2 weeks  6/19/119 Patient recovered his recent illness.  He recently had blood work drawn at work simply to test a new machine.  Apparently the blood work showed he had an elevated troponin level.  This occurred several weeks ago.  The patient was not originally notified of the problem.  Once he became aware, he sought consultation here.  He does not have the actual value to review.  He denies any chest pain.  He denies any shortness of breath.  He denies any dyspnea on exertion.  He denies any pleurisy.  He does report some orthostatic dizziness with standing but otherwise is doing well.   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  Past Medical History:  Diagnosis Date  . Hearing loss   . Hyperlipidemia   . Labral tear of shoulder    Past Surgical History:  Procedure Laterality Date  . EYE SURGERY  2000   lasic  . VASECTOMY  1983   Current Outpatient Medications on File Prior to Visit  Medication Sig Dispense Refill  . atorvastatin (LIPITOR) 10 MG tablet TAKE 1 TABLET BY MOUTH DAILY 90 tablet 0  . Multiple Vitamin (MULTIVITAMIN) capsule Take 1 capsule by mouth daily.     No current facility-administered medications on file prior to visit.    Allergies  Allergen Reactions  . Sulfa Antibiotics    Social History   Socioeconomic History  . Marital status: Married    Spouse name: Not on file  . Number of children: Not on file  . Years of education: Not on file  . Highest education level: Not on file  Occupational History  . Not on file  Social Needs  . Financial resource strain: Not on file  . Food insecurity:    Worry: Not on file    Inability: Not on file  . Transportation needs:    Medical: Not on file    Non-medical: Not on file  Tobacco  Use  . Smoking status: Never Smoker  . Smokeless tobacco: Never Used  Substance and Sexual Activity  . Alcohol use: Yes    Alcohol/week: 5.4 oz    Types: 5 Glasses of wine, 4 Cans of beer per week  . Drug use: No  . Sexual activity: Yes    Birth control/protection: Surgical    Comment: vasectomy  Lifestyle  . Physical activity:    Days per week: Not on file    Minutes per session: Not on file  . Stress: Not on file  Relationships  . Social connections:    Talks on phone: Not on file    Gets together: Not on file    Attends religious service: Not on file    Active member of club or organization: Not on file    Attends meetings of clubs or organizations: Not on file    Relationship status: Not on file  . Intimate partner violence:     Fear of current or ex partner: Not on file    Emotionally abused: Not on file    Physically abused: Not on file    Forced sexual activity: Not on file  Other Topics Concern  . Not on file  Social History Narrative  . Not on file      Review of Systems  All other systems reviewed and are negative.      Objective:   Physical Exam  Constitutional: He appears well-developed and well-nourished. No distress.  HENT:  Right Ear: External ear normal.  Left Ear: External ear normal.  Nose: Nose normal.  Mouth/Throat: Oropharynx is clear and moist. No oropharyngeal exudate.  Eyes: Pupils are equal, round, and reactive to light. Conjunctivae are normal.  Neck: Normal range of motion. No JVD present. No tracheal deviation present. No thyromegaly present.  Cardiovascular: Normal rate, regular rhythm, normal heart sounds and intact distal pulses. Exam reveals no gallop and no friction rub.  No murmur heard. Pulmonary/Chest: Effort normal and breath sounds normal. No stridor. No respiratory distress. He has no wheezes. He has no rales.  Abdominal: Soft. Bowel sounds are normal. He exhibits no distension and no mass. There is no tenderness. There is no rebound and no guarding.  Musculoskeletal: He exhibits no edema.  Lymphadenopathy:    He has no cervical adenopathy.  Skin: No rash noted. He is not diaphoretic. No erythema. No pallor.  Vitals reviewed.         Assessment & Plan:  Troponin level elevated - Plan: Troponin I, D-dimer, quantitative (not at [facility])  I believe the lab work likely represents a false positive.  Patient is completely asymptomatic and shows no evidence of congestive heart failure, viral myocarditis, acute coronary syndrome, or cardiac strain secondary to sepsis or a pulmonary embolism.  However given his recent plane flight and his recent illness, I will obtain a d-dimer to rule out occult PE.  I will also repeat his troponin level.  If the troponin level is normal,  I do not feel any further work-up is necessary.  If the d-dimer is elevated, I would proceed with a CT scan of the chest to evaluate for pulmonary embolism given his recent transatlantic flight.  If the troponin level is persistently elevated, I would proceed with an echocardiogram to evaluate for cardiomyopathy.

## 2018-02-18 ENCOUNTER — Encounter (INDEPENDENT_AMBULATORY_CARE_PROVIDER_SITE_OTHER): Payer: Self-pay

## 2018-02-18 LAB — TROPONIN I: Troponin I: 0.01 ng/mL (ref <=0.0)

## 2018-02-18 LAB — D-DIMER, QUANTITATIVE: D-Dimer, Quant: 0.41 mcg/mL FEU (ref <0.50)

## 2018-04-06 ENCOUNTER — Other Ambulatory Visit: Payer: Self-pay | Admitting: Family Medicine

## 2018-04-06 DIAGNOSIS — E7800 Pure hypercholesterolemia, unspecified: Secondary | ICD-10-CM

## 2018-04-26 DIAGNOSIS — H25013 Cortical age-related cataract, bilateral: Secondary | ICD-10-CM | POA: Diagnosis not present

## 2018-04-26 DIAGNOSIS — H25041 Posterior subcapsular polar age-related cataract, right eye: Secondary | ICD-10-CM | POA: Diagnosis not present

## 2018-04-26 DIAGNOSIS — H35372 Puckering of macula, left eye: Secondary | ICD-10-CM | POA: Diagnosis not present

## 2018-06-10 ENCOUNTER — Encounter: Payer: Self-pay | Admitting: Family Medicine

## 2018-06-22 ENCOUNTER — Other Ambulatory Visit: Payer: BLUE CROSS/BLUE SHIELD

## 2018-06-22 DIAGNOSIS — I10 Essential (primary) hypertension: Secondary | ICD-10-CM

## 2018-06-22 DIAGNOSIS — Z125 Encounter for screening for malignant neoplasm of prostate: Secondary | ICD-10-CM

## 2018-06-22 DIAGNOSIS — E785 Hyperlipidemia, unspecified: Secondary | ICD-10-CM | POA: Diagnosis not present

## 2018-06-22 LAB — CBC WITH DIFFERENTIAL/PLATELET
BASOS ABS: 9 {cells}/uL (ref 0–200)
Basophils Relative: 0.3 %
EOS ABS: 69 {cells}/uL (ref 15–500)
Eosinophils Relative: 2.3 %
HEMATOCRIT: 43 % (ref 38.5–50.0)
HEMOGLOBIN: 14.7 g/dL (ref 13.2–17.1)
LYMPHS ABS: 1218 {cells}/uL (ref 850–3900)
MCH: 31.1 pg (ref 27.0–33.0)
MCHC: 34.2 g/dL (ref 32.0–36.0)
MCV: 90.9 fL (ref 80.0–100.0)
MONOS PCT: 12.2 %
MPV: 11.1 fL (ref 7.5–12.5)
Neutro Abs: 1338 cells/uL — ABNORMAL LOW (ref 1500–7800)
Neutrophils Relative %: 44.6 %
Platelets: 171 10*3/uL (ref 140–400)
RBC: 4.73 10*6/uL (ref 4.20–5.80)
RDW: 12.8 % (ref 11.0–15.0)
Total Lymphocyte: 40.6 %
WBC mixed population: 366 cells/uL (ref 200–950)
WBC: 3 10*3/uL — ABNORMAL LOW (ref 3.8–10.8)

## 2018-06-22 LAB — COMPREHENSIVE METABOLIC PANEL
AG Ratio: 2 (calc) (ref 1.0–2.5)
ALKALINE PHOSPHATASE (APISO): 30 U/L — AB (ref 40–115)
ALT: 25 U/L (ref 9–46)
AST: 29 U/L (ref 10–35)
Albumin: 4.5 g/dL (ref 3.6–5.1)
BUN: 16 mg/dL (ref 7–25)
CO2: 27 mmol/L (ref 20–32)
CREATININE: 0.98 mg/dL (ref 0.70–1.25)
Calcium: 9.5 mg/dL (ref 8.6–10.3)
Chloride: 103 mmol/L (ref 98–110)
Globulin: 2.2 g/dL (calc) (ref 1.9–3.7)
Glucose, Bld: 107 mg/dL — ABNORMAL HIGH (ref 65–99)
Potassium: 4.7 mmol/L (ref 3.5–5.3)
Sodium: 139 mmol/L (ref 135–146)
Total Bilirubin: 0.8 mg/dL (ref 0.2–1.2)
Total Protein: 6.7 g/dL (ref 6.1–8.1)

## 2018-06-22 LAB — LIPID PANEL
CHOL/HDL RATIO: 2.4 (calc) (ref <5.0)
CHOLESTEROL: 204 mg/dL — AB (ref <200)
HDL: 85 mg/dL (ref >40)
LDL Cholesterol (Calc): 105 mg/dL (calc) — ABNORMAL HIGH
Non-HDL Cholesterol (Calc): 119 mg/dL (calc) (ref <130)
TRIGLYCERIDES: 62 mg/dL (ref <150)

## 2018-06-22 LAB — PSA: PSA: 0.8 ng/mL (ref <=4.0)

## 2018-06-24 ENCOUNTER — Ambulatory Visit: Payer: BLUE CROSS/BLUE SHIELD | Admitting: Family Medicine

## 2018-06-24 ENCOUNTER — Encounter: Payer: Self-pay | Admitting: Family Medicine

## 2018-06-24 VITALS — BP 134/82 | HR 43 | Temp 97.6°F | Resp 12 | Ht 75.0 in | Wt 200.0 lb

## 2018-06-24 DIAGNOSIS — Z8249 Family history of ischemic heart disease and other diseases of the circulatory system: Secondary | ICD-10-CM | POA: Diagnosis not present

## 2018-06-24 MED ORDER — ATORVASTATIN CALCIUM 40 MG PO TABS: 40.0000 mg | ORAL_TABLET | Freq: Every day | ORAL | 3 refills | Status: DC

## 2018-06-25 NOTE — Progress Notes (Signed)
Subjective:    Patient ID: Patrick Wall, male    DOB: 02/10/1951, 67 y.o.   MRN: [id number]  HPI  Patient is here today requesting a referral to cardiology for a coronary artery calcium score.  This was recommended by his daughter.  He does have a family history of premature cardiovascular disease.  His paternal aunt died at 56 from a heart attack.  His father died prematurely suddenly in his late 50s after snorkeling.  No autopsy was performed but most likely was either a cardiovascular event or pulmonary embolism as he was a polyp.  His mother had cardiovascular disease later in life in her 12s.  His paternal grandfather also had coronary artery disease later in life in his 78s.  Patient is an extremely active athlete.  He competes in triathlons.  He denies any chest pain shortness of breath or dyspnea on exertion.  He does have a faint systolic ejection murmur heard best over the aortic valve.  However he is asymptomatic.  Most recent lab work is listed below.  Based on this lab work, his calculated 10-year risk of cardiovascular disease is 11.2%.  He is currently taking Lipitor 10 mg.  Based on a cardiovascular disease risk greater than 10% he should be on a moderate to high intensity statin. Lab on 06/22/2018  Component Date Value Ref Range Status  . Glucose, Bld 06/22/2018 107* 65 - 99 mg/dL Final   Comment: .            Fasting reference interval . For someone without known diabetes, a glucose value between 100 and 125 mg/dL is consistent with prediabetes and should be confirmed with a follow-up test. .   . BUN 06/22/2018 16  7 - 25 mg/dL Final  . Creat 40/98/1191 0.98  0.70 - 1.25 mg/dL Final   Comment: For patients >22 years of age, the reference limit for Creatinine is approximately 13% higher for people identified as African-American. .   Edwena Felty Ratio 06/22/2018 NOT APPLICABLE  6 - 22 (calc) Final  . Sodium 06/22/2018 139  135 - 146 mmol/L Final  . Potassium  06/22/2018 4.7  3.5 - 5.3 mmol/L Final  . Chloride 06/22/2018 103  98 - 110 mmol/L Final  . CO2 06/22/2018 27  20 - 32 mmol/L Final  . Calcium 06/22/2018 9.5  8.6 - 10.3 mg/dL Final  . Total Protein 06/22/2018 6.7  6.1 - 8.1 g/dL Final  . Albumin 47/82/9562 4.5  3.6 - 5.1 g/dL Final  . Globulin 13/04/6577 2.2  1.9 - 3.7 g/dL (calc) Final  . AG Ratio 06/22/2018 2.0  1.0 - 2.5 (calc) Final  . Total Bilirubin 06/22/2018 0.8  0.2 - 1.2 mg/dL Final  . Alkaline phosphatase (APISO) 06/22/2018 30* 40 - 115 U/L Final  . AST 06/22/2018 29  10 - 35 U/L Final  . ALT 06/22/2018 25  9 - 46 U/L Final  . Cholesterol 06/22/2018 204* <200 mg/dL Final  . HDL 46/96/2952 85  >40 mg/dL Final  . Triglycerides 06/22/2018 62  <150 mg/dL Final  . LDL Cholesterol (Calc) 06/22/2018 105* mg/dL (calc) Final   Comment: Reference range: <100 . Desirable range <100 mg/dL for primary prevention;   <70 mg/dL for patients with CHD or diabetic patients  with > or = 2 CHD risk factors. Marland Kitchen LDL-C is now calculated using the Martin-Hopkins  calculation, which is a validated novel method providing  better accuracy than the Friedewald equation in the  estimation of LDL-C.  Horald Pollen et al. Lenox Ahr. 1610;960(45): 2061-2068  (http://education.[facility].com/faq/FAQ164)   . Total CHOL/HDL Ratio 06/22/2018 2.4  <4.0 (calc) Final  . Non-HDL Cholesterol (Calc) 06/22/2018 119  <130 mg/dL (calc) Final   Comment: For patients with diabetes plus 1 major ASCVD risk  factor, treating to a non-HDL-C goal of <100 mg/dL  (LDL-C of <98 mg/dL) is considered a therapeutic  option.   . WBC 06/22/2018 3.0* 3.8 - 10.8 Thousand/uL Final  . RBC 06/22/2018 4.73  4.20 - 5.80 Million/uL Final  . Hemoglobin 06/22/2018 14.7  13.2 - 17.1 g/dL Final  . HCT 11/91/4782 43.0  38.5 - 50.0 % Final  . MCV 06/22/2018 90.9  80.0 - 100.0 fL Final  . MCH 06/22/2018 31.1  27.0 - 33.0 pg Final  . MCHC 06/22/2018 34.2  32.0 - 36.0 g/dL Final  . RDW  95/62/1308 12.8  11.0 - 15.0 % Final  . Platelets 06/22/2018 171  140 - 400 Thousand/uL Final  . MPV 06/22/2018 11.1  7.5 - 12.5 fL Final  . Neutro Abs 06/22/2018 1,338* 1,500 - 7,800 cells/uL Final  . Lymphs Abs 06/22/2018 1,218  850 - 3,900 cells/uL Final  . WBC mixed population 06/22/2018 366  200 - 950 cells/uL Final  . Eosinophils Absolute 06/22/2018 69  15 - 500 cells/uL Final  . Basophils Absolute 06/22/2018 9  0 - 200 cells/uL Final  . Neutrophils Relative % 06/22/2018 44.6  % Final  . Total Lymphocyte 06/22/2018 40.6  % Final  . Monocytes Relative 06/22/2018 12.2  % Final  . Eosinophils Relative 06/22/2018 2.3  % Final  . Basophils Relative 06/22/2018 0.3  % Final  . PSA 06/22/2018 0.8  < OR = 4.0 ng/mL Final   Comment: The total PSA value from this assay system is  standardized against the WHO standard. The test  result will be approximately 20% lower when compared  to the equimolar-standardized total PSA (Beckman  Coulter). Comparison of serial PSA results should be  interpreted with this fact in mind. . This test was performed using the Siemens  chemiluminescent method. Values obtained from  different assay methods cannot be used interchangeably. PSA levels, regardless of value, should not be interpreted as absolute evidence of the presence or absence of disease.     Past Medical History:  Diagnosis Date  . Hearing loss   . Hyperlipidemia   . Labral tear of shoulder    Past Surgical History:  Procedure Laterality Date  . EYE SURGERY  2000   lasic  . VASECTOMY  1983   Current Outpatient Medications on File Prior to Visit  Medication Sig Dispense Refill  . Multiple Vitamin (MULTIVITAMIN) capsule Take 1 capsule by mouth daily.     No current facility-administered medications on file prior to visit.    Allergies  Allergen Reactions  . Sulfa Antibiotics    Social History   Socioeconomic History  . Marital status: Married    Spouse name: Not on file  .  Number of children: Not on file  . Years of education: Not on file  . Highest education level: Not on file  Occupational History  . Not on file  Social Needs  . Financial resource strain: Not on file  . Food insecurity:    Worry: Not on file    Inability: Not on file  . Transportation needs:    Medical: Not on file    Non-medical: Not on file  Tobacco Use  . Smoking status: Never Smoker  .  Smokeless tobacco: Never Used  Substance and Sexual Activity  . Alcohol use: Yes    Alcohol/week: 9.0 standard drinks    Types: 5 Glasses of wine, 4 Cans of beer per week  . Drug use: No  . Sexual activity: Yes    Birth control/protection: Surgical    Comment: vasectomy  Lifestyle  . Physical activity:    Days per week: Not on file    Minutes per session: Not on file  . Stress: Not on file  Relationships  . Social connections:    Talks on phone: Not on file    Gets together: Not on file    Attends religious service: Not on file    Active member of club or organization: Not on file    Attends meetings of clubs or organizations: Not on file    Relationship status: Not on file  . Intimate partner violence:    Fear of current or ex partner: Not on file    Emotionally abused: Not on file    Physically abused: Not on file    Forced sexual activity: Not on file  Other Topics Concern  . Not on file  Social History Narrative  . Not on file   Family History  Problem Relation Age of Onset  . Cancer Mother        skin  . Heart disease Mother   . Vision loss Mother   . Early death Father   . Arthritis Maternal Grandmother   . Cancer Maternal Grandmother   . Hyperlipidemia Maternal Grandmother   . Hypertension Maternal Grandmother   . Cancer Maternal Grandfather   . Heart disease Paternal Grandfather   . Stroke Paternal Grandfather       Review of Systems  All other systems reviewed and are negative.      Objective:   Physical Exam  Constitutional: He is oriented to person,  place, and time. He appears well-developed and well-nourished. No distress.  HENT:  Head: Normocephalic and atraumatic.  Right Ear: External ear normal.  Left Ear: External ear normal.  Nose: Nose normal.  Mouth/Throat: Oropharynx is clear and moist. No oropharyngeal exudate.  Eyes: Pupils are equal, round, and reactive to light. Conjunctivae and EOM are normal. Right eye exhibits no discharge. Left eye exhibits no discharge. No scleral icterus.  Neck: Normal range of motion. Neck supple. No JVD present. No tracheal deviation present. No thyromegaly present.  Cardiovascular: Normal rate, regular rhythm, normal heart sounds and intact distal pulses. Exam reveals no gallop and no friction rub.  No murmur heard. Pulmonary/Chest: Effort normal and breath sounds normal. No respiratory distress. He has no wheezes. He has no rales. He exhibits no tenderness.  Abdominal: Soft. Bowel sounds are normal. He exhibits no distension and no mass. There is no tenderness. There is no rebound and no guarding.  Musculoskeletal: He exhibits no edema.  Lymphadenopathy:    He has no cervical adenopathy.  Neurological: He is alert and oriented to person, place, and time. He displays normal reflexes. No cranial nerve deficit. He exhibits normal muscle tone. Coordination normal.  Skin: Skin is warm. No rash noted. He is not diaphoretic. No erythema. No pallor.  Psychiatric: He has a normal mood and affect. His behavior is normal. Judgment and thought content normal.  Vitals reviewed.         Assessment & Plan:  Family history of premature CAD I will gladly refer the patient to cardiology for coronary artery calcium score or at  least to discuss with a cardiologist the procedure.  I explained that this is not a recommended screening test based on [place] preventive services task force recommendations.  However he has risk factors including hyperlipidemia as well as family history.  Fortunately, the patient is  asymptomatic and therefore I believe we need to focus on risk factor reduction.  I would increase his statin from 10 mg a day to Lipitor 40 mg a day so that he is on a moderate to high intensity statin.  Based on 10-year cardiovascular risk greater than 10%, the patient elects to stay on a baby aspirin every day.

## 2018-06-27 ENCOUNTER — Other Ambulatory Visit: Payer: Self-pay | Admitting: Family Medicine

## 2018-07-04 ENCOUNTER — Encounter: Payer: Self-pay | Admitting: Family Medicine

## 2018-07-13 ENCOUNTER — Encounter: Payer: Self-pay | Admitting: Cardiovascular Disease

## 2018-08-22 NOTE — Progress Notes (Signed)
Cardiology Office Note   Date:  08/27/2018   ID:  Patrick Wall, DOB Oct 24, 1950, MRN [id number]  PCP:  Donita BrooksPickard, Coone T, MD  Cardiologist:   Charlton HawsPeter Nishan, MD   No chief complaint on file.     History of Present Illness: Patrick Alilan Legros is a 67 y.o. male who presents for consultation regarding CAD. Referred by Dr Tanya NonesPickard Reviewed His office note from 06/24/18. Paternal aunt died at 2755 of MI. Father died suddenly age 66's after snorkeling Patient Himself is active competing in triathlons.  No cardiac symptoms Primary noted a soft systolic murmur. Currently Taking statin. Had CXR 01/29/18 for cough NAD. ECG in 2014 was normal bradycardia rate 45  LDL 105 on labs checked  06/22/18 Lipitor dose increased to 40 mg daily  He does triathlons when he runs first mile which is hard then feels better Had cath in 2007 for abnormal holter ? 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   Married works in lab. 3 children [place] in [place] and [place] in [place]   Past Medical History:  Diagnosis Date  . Hearing loss   . Hyperlipidemia   . Labral tear of shoulder     Past Surgical History:  Procedure Laterality Date  . EYE SURGERY  2000   lasic  . VASECTOMY  1983     Current Outpatient Medications  Medication Sig Dispense Refill  . atorvastatin (LIPITOR) 40 MG tablet Take 1 tablet (40 mg total) by mouth daily. 90 tablet 3  . Multiple Vitamin (MULTIVITAMIN) capsule Take 1 capsule by mouth daily.     No current facility-administered medications for this visit.     Allergies:   Sulfa antibiotics    Social History:  The patient  reports that he has never smoked. He has never used smokeless tobacco. He reports current alcohol use of about 9.0 standard drinks of alcohol per week. He reports that he does not use drugs.   Family History:  The patient's family history includes Arthritis in his maternal grandmother; Cancer in his maternal  grandfather, maternal grandmother, and mother; Early death in his father; Heart disease in his mother and paternal grandfather; Hyperlipidemia in his maternal grandmother; Hypertension in his maternal grandmother; Stroke in his paternal grandfather; Vision loss in his mother.    ROS:  Please see the history of present illness.   Otherwise, review of systems are positive for none.   All other systems are reviewed and negative.    PHYSICAL EXAM: VS:  BP 126/74   Pulse (!) 51   Ht 6\' 3"  (1.905 m)   Wt 206 lb 12.8 oz (93.8 kg)   SpO2 99%   BMI 25.85 kg/m  , BMI Body mass index is 25.85 kg/m. Affect appropriate Healthy:  appears stated age HEENT: normal Neck supple with no adenopathy JVP normal no bruits no thyromegaly Lungs clear with no wheezing and good diaphragmatic motion Heart:  S1/S2 SEM murmur radiates to carotids , no rub, gallop or click PMI normal Abdomen: benighn, BS positve, no tenderness, no AAA no bruit.  No HSM or HJR Distal pulses intact with no bruits No edema Neuro non-focal Skin warm and dry No muscular weakness    EKG:  2014 SB rate 45 normal 08/27/18 SR rate 51 normal voltage for LVH   Recent Labs: 06/22/2018: ALT 25; BUN 16; Creat 0.98; Hemoglobin 14.7; Platelets 171; Potassium 4.7; Sodium 139    Lipid  Panel    Component Value Date/Time   CHOL 204 (H) 06/22/2018 0817   TRIG 62 06/22/2018 0817   HDL 85 06/22/2018 0817   CHOLHDL 2.4 06/22/2018 0817   VLDL 10 10/15/2016 0832   LDLCALC 105 (H) 06/22/2018 0817      Wt Readings from Last 3 Encounters:  08/27/18 206 lb 12.8 oz (93.8 kg)  06/24/18 200 lb (90.7 kg)  02/17/18 199 lb (90.3 kg)      Other studies Reviewed: Additional studies/ records that were reviewed today include: notes from primary labs CXR and ECG 2014.    ASSESSMENT AND PLAN:  1.  CAD Risk:  Agree that coronary calcium score indicated will order ETT only if higher than average for his age Baseline ECG normal no chest pain    2. HLD:  Statin dose recently increased f/u labs with primary  3. Bradycardia:  Functional due to training effect normal ECG 4. Murmur :  In aortic area will order echo to assess no need for SBE    Current medicines are reviewed at length with the patient today.  The patient does not have concerns regarding medicines.  The following changes have been made:  no change  Labs/ tests ordered today include: TTE, Calcium Score and TTE   Orders Placed This Encounter  Procedures  . CT CARDIAC SCORING  . EKG 12-Lead  . ECHOCARDIOGRAM COMPLETE     Disposition:   FU with cardiology PRN      Signed, Charlton HawsPeter Nishan, MD  08/27/2018 9:14 AM    [facility] [address], [place], [place]  [address] Phone: [phone]; Fax: [phone]

## 2018-08-27 ENCOUNTER — Ambulatory Visit: Payer: BLUE CROSS/BLUE SHIELD | Admitting: Cardiovascular Disease

## 2018-08-27 ENCOUNTER — Encounter: Payer: Self-pay | Admitting: Cardiovascular Disease

## 2018-08-27 VITALS — BP 126/74 | HR 51 | Ht 75.0 in | Wt 206.8 lb

## 2018-08-27 DIAGNOSIS — E785 Hyperlipidemia, unspecified: Secondary | ICD-10-CM

## 2018-08-27 DIAGNOSIS — I2510 Atherosclerotic heart disease of native coronary artery without angina pectoris: Secondary | ICD-10-CM

## 2018-08-27 DIAGNOSIS — R011 Cardiac murmur, unspecified: Secondary | ICD-10-CM | POA: Diagnosis not present

## 2018-08-27 DIAGNOSIS — R001 Bradycardia, unspecified: Secondary | ICD-10-CM | POA: Diagnosis not present

## 2018-08-27 NOTE — Patient Instructions (Addendum)
Medication Instructions:   If you need a refill on your cardiac medications before your next appointment, please call your pharmacy.   Lab work:  If you have labs (blood work) drawn today and your tests are completely normal, you will receive your results only by: Marland Kitchen. MyChart Message (if you have MyChart) OR . A paper copy in the mail If you have any lab test that is abnormal or we need to change your treatment, we will call you to review the results.  Testing/Procedures: Non-Cardiac CT scanning for calcium score (CAT scanning), is a noninvasive, special x-ray that produces cross-sectional images of the body using x-rays and a computer. CT scans help physicians diagnose and treat medical conditions. For some CT exams, a contrast material is used to enhance visibility in the area of the body being studied. CT scans provide greater clarity and reveal more details than regular x-ray exams.  Your physician has requested that you have an echocardiogram. Echocardiography is a painless test that uses sound waves to create images of your heart. It provides your doctor with information about the size and shape of your heart and how well your heart's chambers and valves are working. This procedure takes approximately one hour. There are no restrictions for this procedure.  Follow-Up: At [facility], you and your health needs are our priority.  As part of our continuing mission to provide you with exceptional heart care, we have created designated Provider Care Teams.  These Care Teams include your primary Cardiologist (physician) and Advanced Practice Providers (APPs -  Physician Assistants and Nurse Practitioners) who all work together to provide you with the care you need, when you need it. Your physician recommends that you schedule a follow-up appointment as needed with Dr. Eden EmmsNishan.

## 2018-09-02 ENCOUNTER — Other Ambulatory Visit (HOSPITAL_COMMUNITY): Payer: BLUE CROSS/BLUE SHIELD

## 2018-09-02 ENCOUNTER — Ambulatory Visit (HOSPITAL_COMMUNITY): Payer: BLUE CROSS/BLUE SHIELD | Attending: Cardiovascular Disease

## 2018-09-02 ENCOUNTER — Other Ambulatory Visit: Payer: Self-pay

## 2018-09-02 ENCOUNTER — Ambulatory Visit (INDEPENDENT_AMBULATORY_CARE_PROVIDER_SITE_OTHER)
Admission: RE | Admit: 2018-09-02 | Discharge: 2018-09-02 | Disposition: A | Discharge status: Home / Self Care | Payer: Self-pay | Source: Ambulatory Visit | Attending: Cardiovascular Disease | Admitting: Cardiovascular Disease

## 2018-09-02 DIAGNOSIS — I2510 Atherosclerotic heart disease of native coronary artery without angina pectoris: Secondary | ICD-10-CM | POA: Diagnosis not present

## 2018-09-02 DIAGNOSIS — R001 Bradycardia, unspecified: Secondary | ICD-10-CM

## 2018-09-02 DIAGNOSIS — R011 Cardiac murmur, unspecified: Secondary | ICD-10-CM | POA: Diagnosis not present

## 2018-09-02 DIAGNOSIS — E785 Hyperlipidemia, unspecified: Secondary | ICD-10-CM | POA: Diagnosis not present

## 2018-09-06 ENCOUNTER — Telehealth: Payer: Self-pay

## 2018-09-06 DIAGNOSIS — R011 Cardiac murmur, unspecified: Secondary | ICD-10-CM

## 2018-09-06 NOTE — Telephone Encounter (Signed)
-----   Message from Wendall Stade, MD sent at 09/06/2018  8:22 AM EST ----- EF normal just mild MR RV dilated have him come back for limited bubble study with echo

## 2018-09-06 NOTE — Telephone Encounter (Signed)
Patient aware of echo results. Per Dr. Eden Emms, EF normal just mild MR RV dilated have him come back for limited bubble study with echo. Patient verbalized understanding. Will put order in for bubble study with echo.

## 2018-09-10 ENCOUNTER — Ambulatory Visit (HOSPITAL_COMMUNITY): Payer: BLUE CROSS/BLUE SHIELD | Attending: Cardiovascular Disease

## 2018-09-10 ENCOUNTER — Other Ambulatory Visit: Payer: Self-pay

## 2018-09-10 DIAGNOSIS — R011 Cardiac murmur, unspecified: Secondary | ICD-10-CM | POA: Diagnosis not present

## 2018-09-13 ENCOUNTER — Telehealth: Payer: Self-pay

## 2018-09-13 DIAGNOSIS — Z8249 Family history of ischemic heart disease and other diseases of the circulatory system: Secondary | ICD-10-CM

## 2018-09-13 DIAGNOSIS — R931 Abnormal findings on diagnostic imaging of heart and coronary circulation: Secondary | ICD-10-CM

## 2018-09-13 NOTE — Telephone Encounter (Signed)
-----   Message from Wendall Stade, MD sent at 09/13/2018  8:45 AM EST ----- Calcium score was high at 424 which is 78 th percentile for age and sex. Schedule f/u exercise myovue

## 2018-09-13 NOTE — Telephone Encounter (Signed)
Patient aware of results. Patient will have myoview scheduled. Went over instructions. Will also send through Mychart.

## 2018-09-14 ENCOUNTER — Telehealth (HOSPITAL_COMMUNITY): Payer: Self-pay | Admitting: *Deleted

## 2018-09-14 NOTE — Telephone Encounter (Signed)
Patient given detailed instructions per Myocardial Perfusion Study Information Sheet for the test on 09/17/18 at 10:30. Patient notified to arrive 15 minutes early and that it is imperative to arrive on time for appointment to keep from having the test rescheduled.  If you need to cancel or reschedule your appointment, please call the office within 24 hours of your appointment. . Patient verbalized understanding.Patrick Wall

## 2018-09-15 ENCOUNTER — Telehealth: Payer: Self-pay | Admitting: Cardiovascular Disease

## 2018-09-15 NOTE — Telephone Encounter (Signed)
Medical records requested from [facility] - Dr. Addison Lank. 09/15/18 vlm

## 2018-09-17 ENCOUNTER — Ambulatory Visit (HOSPITAL_COMMUNITY): Payer: BLUE CROSS/BLUE SHIELD | Attending: Cardiovascular Disease

## 2018-09-17 ENCOUNTER — Other Ambulatory Visit: Payer: Self-pay | Admitting: Family Medicine

## 2018-09-17 DIAGNOSIS — Z8249 Family history of ischemic heart disease and other diseases of the circulatory system: Secondary | ICD-10-CM

## 2018-09-17 DIAGNOSIS — R931 Abnormal findings on diagnostic imaging of heart and coronary circulation: Secondary | ICD-10-CM

## 2018-09-17 LAB — MYOCARDIAL PERFUSION IMAGING
CHL CUP NUCLEAR SSS: 3
CSEPEDS: 0 s
CSEPPHR: 134 {beats}/min
Estimated workload: 13.4 METS
Exercise duration (min): 12 min
LVDIAVOL: 124 mL (ref 62–150)
LVSYSVOL: 51 mL
MPHR: 154 {beats}/min
Percent HR: 87 %
Rest HR: 53 {beats}/min
SDS: 3
SRS: 0
TID: 0.98

## 2018-09-17 MED ORDER — TECHNETIUM TC 99M TETROFOSMIN IV KIT
31.4000 | PACK | Freq: Once | INTRAVENOUS | Status: AC | PRN
  Administered 2018-09-17: 31.4 via INTRAVENOUS
  Filled 2018-09-17: qty 32

## 2018-09-17 MED ORDER — TECHNETIUM TC 99M TETROFOSMIN IV KIT
10.2000 | PACK | Freq: Once | INTRAVENOUS | Status: AC | PRN
  Administered 2018-09-17: 10.2 via INTRAVENOUS
  Filled 2018-09-17: qty 11

## 2018-09-24 ENCOUNTER — Encounter: Payer: Self-pay | Admitting: Family Medicine

## 2018-09-27 MED ORDER — ATORVASTATIN CALCIUM 40 MG PO TABS: 40.0000 mg | ORAL_TABLET | Freq: Every day | ORAL | 3 refills | Status: DC

## 2018-10-18 ENCOUNTER — Telehealth: Payer: Self-pay | Admitting: Cardiovascular Disease

## 2018-10-18 NOTE — Telephone Encounter (Signed)
Medical records received from [facility]. 10/18/18 vlm

## 2018-11-02 DIAGNOSIS — H25013 Cortical age-related cataract, bilateral: Secondary | ICD-10-CM | POA: Diagnosis not present

## 2018-11-02 DIAGNOSIS — H35372 Puckering of macula, left eye: Secondary | ICD-10-CM | POA: Diagnosis not present

## 2018-11-02 DIAGNOSIS — H35411 Lattice degeneration of retina, right eye: Secondary | ICD-10-CM | POA: Diagnosis not present

## 2019-04-25 DIAGNOSIS — M5432 Sciatica, left side: Secondary | ICD-10-CM | POA: Diagnosis not present

## 2019-04-25 DIAGNOSIS — M9903 Segmental and somatic dysfunction of lumbar region: Secondary | ICD-10-CM | POA: Diagnosis not present

## 2019-04-25 DIAGNOSIS — M7042 Prepatellar bursitis, left knee: Secondary | ICD-10-CM | POA: Diagnosis not present

## 2019-04-25 DIAGNOSIS — M9905 Segmental and somatic dysfunction of pelvic region: Secondary | ICD-10-CM | POA: Diagnosis not present

## 2019-05-02 DIAGNOSIS — M5432 Sciatica, left side: Secondary | ICD-10-CM | POA: Diagnosis not present

## 2019-05-02 DIAGNOSIS — M9903 Segmental and somatic dysfunction of lumbar region: Secondary | ICD-10-CM | POA: Diagnosis not present

## 2019-05-02 DIAGNOSIS — M7042 Prepatellar bursitis, left knee: Secondary | ICD-10-CM | POA: Diagnosis not present

## 2019-05-02 DIAGNOSIS — M9905 Segmental and somatic dysfunction of pelvic region: Secondary | ICD-10-CM | POA: Diagnosis not present

## 2019-05-03 ENCOUNTER — Other Ambulatory Visit: Payer: BLUE CROSS/BLUE SHIELD

## 2019-05-06 ENCOUNTER — Encounter: Payer: BLUE CROSS/BLUE SHIELD | Admitting: Family Medicine

## 2019-05-16 ENCOUNTER — Encounter: Payer: Self-pay | Admitting: Family Medicine

## 2019-05-16 DIAGNOSIS — M5432 Sciatica, left side: Secondary | ICD-10-CM | POA: Diagnosis not present

## 2019-05-16 DIAGNOSIS — M9905 Segmental and somatic dysfunction of pelvic region: Secondary | ICD-10-CM | POA: Diagnosis not present

## 2019-05-16 DIAGNOSIS — M9903 Segmental and somatic dysfunction of lumbar region: Secondary | ICD-10-CM | POA: Diagnosis not present

## 2019-05-16 DIAGNOSIS — M7042 Prepatellar bursitis, left knee: Secondary | ICD-10-CM | POA: Diagnosis not present

## 2019-05-20 ENCOUNTER — Other Ambulatory Visit: Payer: Self-pay

## 2019-05-20 ENCOUNTER — Other Ambulatory Visit: Payer: Medicare Other

## 2019-05-20 DIAGNOSIS — Z0000 Encounter for general adult medical examination without abnormal findings: Secondary | ICD-10-CM | POA: Diagnosis not present

## 2019-05-20 DIAGNOSIS — Z125 Encounter for screening for malignant neoplasm of prostate: Secondary | ICD-10-CM | POA: Diagnosis not present

## 2019-05-21 LAB — CBC WITH DIFFERENTIAL/PLATELET
Absolute Monocytes: 283 cells/uL (ref 200–950)
Basophils Absolute: 10 cells/uL (ref 0–200)
Basophils Relative: 0.4 %
Eosinophils Absolute: 70 cells/uL (ref 15–500)
Eosinophils Relative: 2.7 %
HCT: 43.2 % (ref 38.5–50.0)
Hemoglobin: 14.6 g/dL (ref 13.2–17.1)
Lymphs Abs: 1248 cells/uL (ref 850–3900)
MCH: 31.2 pg (ref 27.0–33.0)
MCHC: 33.8 g/dL (ref 32.0–36.0)
MCV: 92.3 fL (ref 80.0–100.0)
MPV: 11.2 fL (ref 7.5–12.5)
Monocytes Relative: 10.9 %
Neutro Abs: 988 cells/uL — ABNORMAL LOW (ref 1500–7800)
Neutrophils Relative %: 38 %
Platelets: 159 10*3/uL (ref 140–400)
RBC: 4.68 10*6/uL (ref 4.20–5.80)
RDW: 13.2 % (ref 11.0–15.0)
Total Lymphocyte: 48 %
WBC: 2.6 10*3/uL — ABNORMAL LOW (ref 3.8–10.8)

## 2019-05-21 LAB — COMPREHENSIVE METABOLIC PANEL
AG Ratio: 2.1 (calc) (ref 1.0–2.5)
ALT: 26 U/L (ref 9–46)
AST: 30 U/L (ref 10–35)
Albumin: 4.5 g/dL (ref 3.6–5.1)
Alkaline phosphatase (APISO): 31 U/L — ABNORMAL LOW (ref 35–144)
BUN: 21 mg/dL (ref 7–25)
CO2: 24 mmol/L (ref 20–32)
Calcium: 9.5 mg/dL (ref 8.6–10.3)
Chloride: 106 mmol/L (ref 98–110)
Creat: 1.18 mg/dL (ref 0.70–1.25)
Globulin: 2.1 g/dL (calc) (ref 1.9–3.7)
Glucose, Bld: 105 mg/dL — ABNORMAL HIGH (ref 65–99)
Potassium: 4.7 mmol/L (ref 3.5–5.3)
Sodium: 140 mmol/L (ref 135–146)
Total Bilirubin: 0.8 mg/dL (ref 0.2–1.2)
Total Protein: 6.6 g/dL (ref 6.1–8.1)

## 2019-05-21 LAB — LIPID PANEL
Cholesterol: 184 mg/dL (ref <200)
HDL: 90 mg/dL (ref >=40)
LDL Cholesterol (Calc): 81 mg/dL (calc)
Non-HDL Cholesterol (Calc): 94 mg/dL (calc) (ref <130)
Total CHOL/HDL Ratio: 2 (calc) (ref <5.0)
Triglycerides: 45 mg/dL (ref <150)

## 2019-05-21 LAB — PSA: PSA: 0.4 ng/mL (ref <=4.0)

## 2019-05-23 DIAGNOSIS — M9905 Segmental and somatic dysfunction of pelvic region: Secondary | ICD-10-CM | POA: Diagnosis not present

## 2019-05-23 DIAGNOSIS — M7042 Prepatellar bursitis, left knee: Secondary | ICD-10-CM | POA: Diagnosis not present

## 2019-05-23 DIAGNOSIS — M9903 Segmental and somatic dysfunction of lumbar region: Secondary | ICD-10-CM | POA: Diagnosis not present

## 2019-05-23 DIAGNOSIS — M5432 Sciatica, left side: Secondary | ICD-10-CM | POA: Diagnosis not present

## 2019-05-24 ENCOUNTER — Other Ambulatory Visit: Payer: Self-pay

## 2019-05-24 ENCOUNTER — Encounter: Payer: Self-pay | Admitting: Family Medicine

## 2019-05-24 ENCOUNTER — Ambulatory Visit (INDEPENDENT_AMBULATORY_CARE_PROVIDER_SITE_OTHER): Payer: BC Managed Care – PPO | Admitting: Family Medicine

## 2019-05-24 VITALS — BP 140/90 | HR 56 | Temp 97.6°F | Resp 12 | Ht 75.0 in | Wt 191.0 lb

## 2019-05-24 DIAGNOSIS — I10 Essential (primary) hypertension: Secondary | ICD-10-CM | POA: Diagnosis not present

## 2019-05-24 DIAGNOSIS — Z0001 Encounter for general adult medical examination with abnormal findings: Secondary | ICD-10-CM | POA: Diagnosis not present

## 2019-05-24 DIAGNOSIS — Z125 Encounter for screening for malignant neoplasm of prostate: Secondary | ICD-10-CM | POA: Diagnosis not present

## 2019-05-24 DIAGNOSIS — E785 Hyperlipidemia, unspecified: Secondary | ICD-10-CM

## 2019-05-24 DIAGNOSIS — Z0000 Encounter for general adult medical examination without abnormal findings: Secondary | ICD-10-CM

## 2019-05-24 NOTE — Progress Notes (Signed)
Subjective:    Patient ID: Patrick Wall, male    DOB: 17-Nov-1950, 68 y.o.   MRN: [id number]  HPI  Patient is a very pleasant 68 year old Caucasian male here today for complete physical exam.  His last colonoscopy was in 2014.  Previously he thought he had a history of polyps however we reviewed his most recent colonoscopy report and the patient had no polyps or abnormalities found.  I am not certain why they recommended a repeat colonoscopy in 5 years.  He has no family history of colon cancer and he denies any history of polyps.  Therefore it seems that he would be appropriate for a 10-year screening interval.  We discussed performing the colonoscopy now versus waiting until 2024, versus performing Cologuard.  The patient would like to consider these options.  His PSA was checked and was found to be normal for prostate cancer screening.  His immunizations are listed below.  He is only due for his flu shot but he prefers to get this at work.  His only concern is some pain over the lateral aspect of his left knee.  He has pain with patellar grind.  He has no laxity to varus or valgus stress.  He has a negative anterior and posterior drawer sign.  There is no erythema or effusion.  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  So far he has not tried any ibuprofen which I have recommended.  He denies any falls, depression, or memory loss Immunization History  Administered Date(s) Administered  . Influenza Split 05/21/2015  . Influenza-Unspecified 06/02/2016, 05/18/2017  . Pneumococcal Conjugate-13 07/09/2017  . Pneumococcal Polysaccharide-23 09/19/2014  . Tdap 04/01/2014  . Zoster 03/07/2014    Lab on 05/20/2019  Component Date Value Ref Range Status  . Cholesterol 05/20/2019 184  <200 mg/dL Final  . HDL 27/01/2375 90  > OR = 40 mg/dL Final  . Triglycerides 05/20/2019 45  <150 mg/dL Final  . LDL Cholesterol (Calc) 05/20/2019 81  mg/dL (calc) Final    Comment: Reference range: <100 . Desirable range <100 mg/dL for primary prevention;   <70 mg/dL for patients with CHD or diabetic patients  with > or = 2 CHD risk factors. Marland Kitchen LDL-C is now calculated using the Martin-Hopkins  calculation, which is a validated novel method providing  better accuracy than the Friedewald equation in the  estimation of LDL-C.  Horald Pollen et al. Lenox Ahr. 2831;517(61): 2061-2068  (http://education.[facility].com/faq/FAQ164)   . Total CHOL/HDL Ratio 05/20/2019 2.0  <6.0 (calc) Final  . Non-HDL Cholesterol (Calc) 05/20/2019 94  <130 mg/dL (calc) Final   Comment: For patients with diabetes plus 1 major ASCVD risk  factor, treating to a non-HDL-C goal of <100 mg/dL  (LDL-C of <73 mg/dL) is considered a therapeutic  option.   . Glucose, Bld 05/20/2019 105* 65 - 99 mg/dL Final   Comment: .            Fasting reference interval . For someone without known diabetes, a glucose value between 100 and 125 mg/dL is consistent with prediabetes and should be confirmed with a follow-up test. .   . BUN 05/20/2019 21  7 - 25 mg/dL Final  . Creat 71/01/2693 1.18  0.70 - 1.25 mg/dL Final   Comment: For patients >64 years of age, the reference limit for Creatinine is approximately 13% higher for people identified as African-American. .   Edwena Felty Ratio 05/20/2019 NOT APPLICABLE  6 - 22 (calc) Final  .  Sodium 05/20/2019 140  135 - 146 mmol/L Final  . Potassium 05/20/2019 4.7  3.5 - 5.3 mmol/L Final  . Chloride 05/20/2019 106  98 - 110 mmol/L Final  . CO2 05/20/2019 24  20 - 32 mmol/L Final  . Calcium 05/20/2019 9.5  8.6 - 10.3 mg/dL Final  . Total Protein 05/20/2019 6.6  6.1 - 8.1 g/dL Final  . Albumin 05/20/2019 4.5  3.6 - 5.1 g/dL Final  . Globulin 05/20/2019 2.1  1.9 - 3.7 g/dL (calc) Final  . AG Ratio 05/20/2019 2.1  1.0 - 2.5 (calc) Final  . Total Bilirubin 05/20/2019 0.8  0.2 - 1.2 mg/dL Final  . Alkaline phosphatase (APISO) 05/20/2019 31* 35 - 144  U/L Final  . AST 05/20/2019 30  10 - 35 U/L Final  . ALT 05/20/2019 26  9 - 46 U/L Final  . WBC 05/20/2019 2.6* 3.8 - 10.8 Thousand/uL Final  . RBC 05/20/2019 4.68  4.20 - 5.80 Million/uL Final  . Hemoglobin 05/20/2019 14.6  13.2 - 17.1 g/dL Final  . HCT 05/20/2019 43.2  38.5 - 50.0 % Final  . MCV 05/20/2019 92.3  80.0 - 100.0 fL Final  . MCH 05/20/2019 31.2  27.0 - 33.0 pg Final  . MCHC 05/20/2019 33.8  32.0 - 36.0 g/dL Final  . RDW 05/20/2019 13.2  11.0 - 15.0 % Final  . Platelets 05/20/2019 159  140 - 400 Thousand/uL Final  . MPV 05/20/2019 11.2  7.5 - 12.5 fL Final  . Neutro Abs 05/20/2019 988* 1,500 - 7,800 cells/uL Final  . Lymphs Abs 05/20/2019 1,248  850 - 3,900 cells/uL Final  . Absolute Monocytes 05/20/2019 283  200 - 950 cells/uL Final  . Eosinophils Absolute 05/20/2019 70  15 - 500 cells/uL Final  . Basophils Absolute 05/20/2019 10  0 - 200 cells/uL Final  . Neutrophils Relative % 05/20/2019 38  % Final  . Total Lymphocyte 05/20/2019 48.0  % Final  . Monocytes Relative 05/20/2019 10.9  % Final  . Eosinophils Relative 05/20/2019 2.7  % Final  . Basophils Relative 05/20/2019 0.4  % Final  . PSA 05/20/2019 0.4  < OR = 4.0 ng/mL Final   Comment: The total PSA value from this assay system is  standardized against the WHO standard. The test  result will be approximately 20% lower when compared  to the equimolar-standardized total PSA (Beckman  Coulter). Comparison of serial PSA results should be  interpreted with this fact in mind. . This test was performed using the Siemens  chemiluminescent method. Values obtained from  different assay methods cannot be used interchangeably. PSA levels, regardless of value, should not be interpreted as absolute evidence of the presence or absence of disease.     Past Medical History:  Diagnosis Date  . Hearing loss   . Hyperlipidemia   . Labral tear of shoulder    Past Surgical History:  Procedure Laterality Date  . EYE SURGERY   2000   lasic  . VASECTOMY  1983   Current Outpatient Medications on File Prior to Visit  Medication Sig Dispense Refill  . atorvastatin (LIPITOR) 40 MG tablet Take 1 tablet (40 mg total) by mouth daily. 90 tablet 3  . Multiple Vitamin (MULTIVITAMIN) capsule Take 1 capsule by mouth daily.     No current facility-administered medications on file prior to visit.    Allergies  Allergen Reactions  . Sulfa Antibiotics    Social History   Socioeconomic History  . Marital status: Married  Spouse name: Not on file  . Number of children: Not on file  . Years of education: Not on file  . Highest education level: Not on file  Occupational History  . Not on file  Social Needs  . Financial resource strain: Not on file  . Food insecurity    Worry: Not on file    Inability: Not on file  . Transportation needs    Medical: Not on file    Non-medical: Not on file  Tobacco Use  . Smoking status: Never Smoker  . Smokeless tobacco: Never Used  Substance and Sexual Activity  . Alcohol use: Yes    Alcohol/week: 9.0 standard drinks    Types: 5 Glasses of wine, 4 Cans of beer per week  . Drug use: No  . Sexual activity: Yes    Birth control/protection: Surgical    Comment: vasectomy  Lifestyle  . Physical activity    Days per week: Not on file    Minutes per session: Not on file  . Stress: Not on file  Relationships  . Social Musicianconnections    Talks on phone: Not on file    Gets together: Not on file    Attends religious service: Not on file    Active member of club or organization: Not on file    Attends meetings of clubs or organizations: Not on file    Relationship status: Not on file  . Intimate partner violence    Fear of current or ex partner: Not on file    Emotionally abused: Not on file    Physically abused: Not on file    Forced sexual activity: Not on file  Other Topics Concern  . Not on file  Social History Narrative  . Not on file   Family History  Problem  Relation Age of Onset  . Cancer Mother        skin  . Heart disease Mother   . Vision loss Mother   . Early death Father   . Arthritis Maternal Grandmother   . Cancer Maternal Grandmother   . Hyperlipidemia Maternal Grandmother   . Hypertension Maternal Grandmother   . Cancer Maternal Grandfather   . Heart disease Paternal Grandfather   . Stroke Paternal Grandfather       Review of Systems  All other systems reviewed and are negative.      Objective:   Physical Exam  Constitutional: He is oriented to person, place, and time. He appears well-developed and well-nourished. No distress.  HENT:  Head: Normocephalic and atraumatic.  Right Ear: External ear normal.  Left Ear: External ear normal.  Nose: Nose normal.  Mouth/Throat: Oropharynx is clear and moist. No oropharyngeal exudate.  Eyes: Pupils are equal, round, and reactive to light. Conjunctivae and EOM are normal. Right eye exhibits no discharge. Left eye exhibits no discharge. No scleral icterus.  Neck: Normal range of motion. Neck supple. No JVD present. No tracheal deviation present. No thyromegaly present.  Cardiovascular: Normal rate, regular rhythm, normal heart sounds and intact distal pulses. Exam reveals no gallop and no friction rub.  No murmur heard. Pulmonary/Chest: Effort normal and breath sounds normal. No respiratory distress. He has no wheezes. He has no rales. He exhibits no tenderness.  Abdominal: Soft. Bowel sounds are normal. He exhibits no distension and no mass. There is no abdominal tenderness. There is no rebound and no guarding.  Musculoskeletal:        General: No edema.  Lymphadenopathy:  He has no cervical adenopathy.  Neurological: He is alert and oriented to person, place, and time. He displays normal reflexes. No cranial nerve deficit. He exhibits normal muscle tone. Coordination normal.  Skin: Skin is warm. No rash noted. He is not diaphoretic. No erythema. No pallor.  Psychiatric: He has  a normal mood and affect. His behavior is normal. Judgment and thought content normal.  Vitals reviewed.         Assessment & Plan:  Physical exam, annual  Hyperlipidemia, unspecified hyperlipidemia type  Essential hypertension  Screening PSA (prostate specific antigen)  Physical exam today is completely normal.  Blood pressure is excellent.  Cholesterol is outstanding.  Colonoscopy is up-to-date although I did offer the Cologuard.  Patient will consider this and let me know.  Immunizations are up-to-date but the patient will get his flu shot at work.  He declines a cortisone injection today for his knee.  I have recommended trying ibuprofen instead.  Patient denies any problems with depression, falls, or memory loss.  Labs does show a mild elevation in blood sugar.  We discussed a low carbohydrate diet.  He also has chronically mildly low white blood cell count however this is been true his entire life and likely represents his normal.  Therefore we will just clinically monitor this.

## 2019-06-07 DIAGNOSIS — M7042 Prepatellar bursitis, left knee: Secondary | ICD-10-CM | POA: Diagnosis not present

## 2019-06-07 DIAGNOSIS — M5432 Sciatica, left side: Secondary | ICD-10-CM | POA: Diagnosis not present

## 2019-06-07 DIAGNOSIS — M9905 Segmental and somatic dysfunction of pelvic region: Secondary | ICD-10-CM | POA: Diagnosis not present

## 2019-06-07 DIAGNOSIS — M9903 Segmental and somatic dysfunction of lumbar region: Secondary | ICD-10-CM | POA: Diagnosis not present

## 2019-08-04 ENCOUNTER — Encounter: Payer: Self-pay | Admitting: Family Medicine

## 2019-08-04 MED ORDER — PREDNISONE 20 MG PO TABS: ORAL_TABLET | ORAL | 0 refills | Status: DC

## 2019-08-04 MED FILL — predniSONE 20 MG TABS: 20 | 6 days supply | Qty: 12 | Fill #0

## 2019-08-10 ENCOUNTER — Encounter: Payer: Self-pay | Admitting: Family Medicine

## 2019-08-10 MED FILL — CLINDAMYCIN HCL 150 MG CAPS: 150 | 1 days supply | Qty: 4 | Fill #0

## 2019-08-12 MED FILL — AMOXICILLIN 500 MG CAPSULE: 500 | 7 days supply | Qty: 21 | Fill #0

## 2019-08-12 MED FILL — HYDROCODON-APAP 10-325: 10-325 | 4 days supply | Qty: 15 | Fill #0

## 2019-08-22 ENCOUNTER — Other Ambulatory Visit: Payer: Self-pay | Admitting: *Deleted

## 2019-08-22 MED ORDER — ATORVASTATIN CALCIUM 40 MG PO TABS: 40.0000 mg | ORAL_TABLET | Freq: Every day | ORAL | 3 refills | Status: DC

## 2019-08-24 ENCOUNTER — Encounter: Payer: Self-pay | Admitting: Family Medicine

## 2019-08-24 ENCOUNTER — Other Ambulatory Visit: Payer: Self-pay | Admitting: Family Medicine

## 2019-08-24 DIAGNOSIS — M5430 Sciatica, unspecified side: Secondary | ICD-10-CM

## 2019-09-01 ENCOUNTER — Other Ambulatory Visit: Payer: Self-pay | Admitting: Family Medicine

## 2019-09-01 ENCOUNTER — Encounter: Payer: Self-pay | Admitting: Family Medicine

## 2019-09-01 DIAGNOSIS — M5430 Sciatica, unspecified side: Secondary | ICD-10-CM

## 2019-09-06 ENCOUNTER — Other Ambulatory Visit: Payer: BLUE CROSS/BLUE SHIELD

## 2019-09-13 ENCOUNTER — Encounter: Payer: Self-pay | Admitting: Family Medicine

## 2019-09-13 ENCOUNTER — Other Ambulatory Visit: Payer: Self-pay

## 2019-09-13 ENCOUNTER — Ambulatory Visit: Payer: BC Managed Care – PPO | Admitting: Family Medicine

## 2019-09-13 DIAGNOSIS — G8929 Other chronic pain: Secondary | ICD-10-CM | POA: Diagnosis not present

## 2019-09-13 DIAGNOSIS — M545 Low back pain, unspecified: Secondary | ICD-10-CM

## 2019-09-13 NOTE — Progress Notes (Signed)
Office Visit Note   Patient: Patrick Wall           Date of Birth: Dec 27, 1950           MRN: [id number] Visit Date: 09/13/2019 Requested by: Donita Brooks, MD [address] [place],  [place] [address] PCP: Donita Brooks, MD  Subjective: Chief Complaint  Patient presents with  . Lower Back - Pain    Pain first thing in the morning when he gets out of bed and stands up. Gets better with ibuprofen. Started in the Summer of 2020. 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 PCP gave prednisone taper - initially helped, but pain returned.    HPI: He is here with low back pain.  Onset this summer, he is a triathlete and started noticing pain in the midline low back with some radiation into the back of the legs toward the knees during his runs.  The pain got steadily worse to the point that he had to stop running for a little while.  In August he began seeing Dr. Lendon Collar for chiropractic treatments.  He did this for about 6 weeks or so and was not really making much progress.  He has been doing a lot of home stretching and strengthening exercises.  He has tried anti-inflammatories and a steroid taper.  He gets temporary relief with the treatments but he still cannot get back to his normal running activities.  His pain is minimal at rest.  In the mornings he has stiffness in his back when he tries to stand up straight, but after taking ibuprofen he feels better for quite a while.  Denies any bowel or bladder dysfunction, fevers or chills, night sweats or unintentional weight change.  He had a remote back injury at work about 15 years ago while working at [employer].  He lifted something awkwardly and injured his back and was referred to chiropractic therapy for a while.  Eventually when he left that job his Workmen's Comp. claim ended and he stopped having treatment.               ROS:   All other systems were reviewed and are negative.  Objective: Vital Signs:  There were no vitals taken for this visit.  Physical Exam:  General:  Alert and oriented, in no acute distress. Pulm:  Breathing unlabored. Psy:  Normal mood, congruent affect.  Low back: He has no significant scoliosis.  He is able to bend forward and touch his toes.  He has no pain with stork test or straight leg raise.  Slight tenderness in the sciatic notch on the right, no bony tenderness over the spinous processes or along the SI joints.  Lower extremity strength and reflexes are normal.  Imaging: None today  Assessment & Plan: 1.  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  We will order x-rays and MRI scan to be done at [facility] and I will call him afterward with the results.  Depending on the results we could contemplate referral for epidural steroid injection or possibly physical therapy.     Procedures: No procedures performed  No notes on file     PMFS History: Patient Active Problem List   Diagnosis Date Noted  . Bilateral knee pain 06/03/2016  . Abnormality of gait 06/03/2016  . Left foot pain 03/18/2016  . Labral tear of shoulder   . Hyperlipidemia   .  Hypertension   . Hearing loss   . 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 Hearing loss   . Hyperlipidemia   . Labral tear of shoulder     Family History  Problem Relation Age of Onset  . Cancer Mother        skin  . Heart disease Mother   . Vision loss Mother   . Early death Father   . Arthritis Maternal Grandmother   . Cancer Maternal Grandmother   . Hyperlipidemia Maternal Grandmother   . Hypertension Maternal Grandmother   . Cancer Maternal Grandfather   . Heart disease Paternal Grandfather   . Stroke Paternal Grandfather     Past Surgical History:  Procedure Laterality Date  . EYE SURGERY  2000   lasic  . VASECTOMY  1983   Social History   Occupational History   . Not on file  Tobacco Use  . Smoking status: Never Smoker  . Smokeless tobacco: Never Used  Substance and Sexual Activity  . Alcohol use: Yes    Alcohol/week: 9.0 standard drinks    Types: 5 Glasses of wine, 4 Cans of beer per week  . Drug use: No  . Sexual activity: Yes    Birth control/protection: Surgical    Comment: vasectomy

## 2019-09-23 ENCOUNTER — Other Ambulatory Visit: Payer: Self-pay

## 2019-09-23 ENCOUNTER — Ambulatory Visit
Admission: RE | Admit: 2019-09-23 | Discharge: 2019-09-23 | Disposition: A | Discharge status: Home / Self Care | Payer: BC Managed Care – PPO | Source: Ambulatory Visit | Attending: Family Medicine | Admitting: Family Medicine

## 2019-09-23 ENCOUNTER — Telehealth: Payer: Self-pay | Admitting: Family Medicine

## 2019-09-23 DIAGNOSIS — G8929 Other chronic pain: Secondary | ICD-10-CM

## 2019-09-23 DIAGNOSIS — M48061 Spinal stenosis, lumbar region without neurogenic claudication: Secondary | ICD-10-CM | POA: Diagnosis not present

## 2019-09-23 DIAGNOSIS — M48062 Spinal stenosis, lumbar region with neurogenic claudication: Secondary | ICD-10-CM

## 2019-09-23 NOTE — Addendum Note (Signed)
Addended by: Lillia Carmel on: 09/23/2019 10:55 AM   Modules accepted: Orders

## 2019-09-23 NOTE — Telephone Encounter (Signed)
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  This results in moderate to severe narrowing of the spinal canal and nerve openings at these levels.

## 2019-10-04 DIAGNOSIS — M545 Low back pain: Secondary | ICD-10-CM | POA: Diagnosis not present

## 2019-10-12 DIAGNOSIS — M545 Low back pain: Secondary | ICD-10-CM | POA: Diagnosis not present

## 2019-10-19 DIAGNOSIS — M545 Low back pain: Secondary | ICD-10-CM | POA: Diagnosis not present

## 2019-10-26 DIAGNOSIS — M545 Low back pain: Secondary | ICD-10-CM | POA: Diagnosis not present

## 2019-10-26 NOTE — Progress Notes (Signed)
Cardiology Office Note   Date:  10/31/2019   ID:  Patrick Wall, DOB Oct 11, 1950, MRN [id number]  PCP:  Donita Brooks, MD  Cardiologist:   Charlton Haws, MD   No chief complaint on file.     History of Present Illness: Patrick Wall is a 69 y.o. male who presents for consultation regarding CAD. Referred by Dr Tanya Nones Reviewed His office note from 07/17/2018. Paternal aunt died at 74 of MI. Father died suddenly age 65's after snorkeling Patient Himself is active competing in triathlons.  No cardiac symptoms Primary noted a soft systolic murmur. Currently Taking statin. Had CXR 01/29/18 for cough NAD. ECG in 2014 was normal bradycardia rate 45  LDL 105 on labs checked  06/22/18 Lipitor dose increased to 40 mg daily  He does triathlons when he runs first mile which is hard then feels better Had cath in 2007 for abnormal holter ? 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   Married works in lab. 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 Hearing loss   . Hyperlipidemia   . Labral tear of shoulder     Past Surgical History:  Procedure Laterality Date  . EYE SURGERY  2000   lasic  . VASECTOMY  1983     Current Outpatient Medications  Medication Sig Dispense Refill  . aspirin EC 81 MG tablet Take 81 mg by mouth daily.    Marland Kitchen atorvastatin (LIPITOR) 40 MG tablet Take 1 tablet (40 mg total) by mouth daily. 90 tablet 3  . Cholecalciferol (VITAMIN D-3) 125 MCG (5000 UT) TABS Take by mouth 3 (three) times a week.    . Multiple Vitamin (MULTIVITAMIN)  capsule Take 1 capsule by mouth daily.     No current facility-administered medications for this visit.    Allergies:   Sulfa antibiotics    Social History:  The patient  reports that he has never smoked. He has never used smokeless tobacco. He reports current alcohol use of about 9.0 standard drinks of alcohol per week. He reports that he does not use drugs.   Family History:  The patient's family history includes Arthritis in his maternal grandmother; Cancer in his maternal grandfather, maternal grandmother, and mother; Early death in his father; Heart disease in his mother and paternal grandfather; Hyperlipidemia in his maternal grandmother; Hypertension in his maternal grandmother; Stroke in his paternal grandfather; Vision loss in his mother.    ROS:  Please see the history of present illness.   Otherwise, review of systems are positive for none.   All other systems are reviewed and negative.    PHYSICAL EXAM: VS:  BP 130/78   Pulse (!) 45   Ht 6\' 3"  (1.905 m)   Wt 195 lb (88.5 kg)   SpO2 98%   BMI 24.37 kg/m  , BMI Body mass index is 24.37 kg/m. Affect appropriate Healthy:  appears stated age HEENT: normal Neck supple with no adenopathy JVP normal no bruits no thyromegaly Lungs clear with no  wheezing and good diaphragmatic motion Heart:  S1/S2 apical MR  murmur radiates to carotids , no rub, gallop or click PMI normal Abdomen: benighn, BS positve, no tenderness, no AAA no bruit.  No HSM or HJR Distal pulses intact with no bruits No edema Neuro non-focal Skin warm and dry No muscular weakness    EKG:  2014 SB rate 45 normal 08/27/18 SR rate 51 normal voltage for LVH SR rate 45 normal    Recent Labs: 05/20/2019: ALT 26; BUN 21; Creat 1.18; Hemoglobin 14.6; Platelets 159; Potassium 4.7; Sodium 140    Lipid Panel    Component Value Date/Time   CHOL 184 05/20/2019 0829   TRIG 45 05/20/2019 0829   HDL 90 05/20/2019 0829   CHOLHDL 2.0 05/20/2019 0829   VLDL 10  10/15/2016 0832   LDLCALC 81 05/20/2019 0829      Wt Readings from Last 3 Encounters:  10/31/19 195 lb (88.5 kg)  05/24/19 191 lb (86.6 kg)  08/27/18 206 lb 12.8 oz (93.8 kg)      Other studies Reviewed: Additional studies/ records that were reviewed today include: notes from primary labs CXR and ECG 2014. Myovue 09/17/18 , echo 09/02/18 calcium score 09/02/18    ASSESSMENT AND PLAN:  1.  CAD Risk:  Calcium score 424 09/13/18 this is 67 th percentile Myovue normal 09/17/18 continue risk factor modification  Consider f/u CTA or nuclear study in a year  2. HLD:  Statin dose recently increased f/u labs with primary  3. Bradycardia:  Functional due to training effect normal ECG 4. Murmur:  Mild MR on TTE done 09/02/18  It is fairly loud but asymptomatic f/u echo in a year  5. PFO : not clinically significant observe  6. Back pain:  F/u with [facility] ? Referral to neurosurgery / ortho for decompressive surgery       Current medicines are reviewed at length with the patient today.  The patient does not have concerns regarding medicines.  The following changes have been made:  no change  Labs/ tests ordered today include:    No orders of the defined types were placed in this encounter.    Disposition:   FU with cardiology in a year     Signed, Jenkins Rouge, MD  10/31/2019 3:15 PM    [address], [place], [place]  [address] Phone: [phone]; Fax: [phone]

## 2019-10-31 ENCOUNTER — Other Ambulatory Visit: Payer: Self-pay

## 2019-10-31 ENCOUNTER — Encounter: Payer: Self-pay | Admitting: Cardiovascular Disease

## 2019-10-31 ENCOUNTER — Ambulatory Visit: Payer: BC Managed Care – PPO | Admitting: Cardiovascular Disease

## 2019-10-31 VITALS — BP 130/78 | HR 45 | Ht 75.0 in | Wt 195.0 lb

## 2019-10-31 DIAGNOSIS — E782 Mixed hyperlipidemia: Secondary | ICD-10-CM

## 2019-10-31 DIAGNOSIS — I2510 Atherosclerotic heart disease of native coronary artery without angina pectoris: Secondary | ICD-10-CM | POA: Diagnosis not present

## 2019-10-31 NOTE — Patient Instructions (Addendum)
Medication Instructions:  *If you need a refill on your cardiac medications before your next appointment, please call your pharmacy*  Lab Work: If you have labs (blood work) drawn today and your tests are completely normal, you will receive your results only by: . MyChart Message (if you have MyChart) OR . A paper copy in the mail If you have any lab test that is abnormal or we need to change your treatment, we will call you to review the results.  Testing/Procedures: None ordered today.   Follow-Up: At [facility], you and your health needs are our priority.  As part of our continuing mission to provide you with exceptional heart care, we have created designated Provider Care Teams.  These Care Teams include your primary Cardiologist (physician) and Advanced Practice Providers (APPs -  Physician Assistants and Nurse Practitioners) who all work together to provide you with the care you need, when you need it.  We recommend signing up for the patient portal called "MyChart".  Sign up information is provided on this After Visit Summary.  MyChart is used to connect with patients for Virtual Visits (Telemedicine).  Patients are able to view lab/test results, encounter notes, upcoming appointments, etc.  Non-urgent messages can be sent to your provider as well.   To learn more about what you can do with MyChart, go to [email or web address].    Your next appointment:   1 year  The format for your next appointment:   In Person  Provider:   You may see Dr. Nishan or one of the following Advanced Practice Providers on your designated Care Team:    Lori Gerhardt, NP  Laura Ingold, NP  Jill McDaniel, NP    

## 2019-11-01 DIAGNOSIS — M545 Low back pain: Secondary | ICD-10-CM | POA: Diagnosis not present

## 2019-11-09 DIAGNOSIS — M545 Low back pain: Secondary | ICD-10-CM | POA: Diagnosis not present

## 2019-11-16 DIAGNOSIS — M545 Low back pain: Secondary | ICD-10-CM | POA: Diagnosis not present

## 2019-12-06 DIAGNOSIS — H35411 Lattice degeneration of retina, right eye: Secondary | ICD-10-CM | POA: Diagnosis not present

## 2019-12-06 DIAGNOSIS — H25013 Cortical age-related cataract, bilateral: Secondary | ICD-10-CM | POA: Diagnosis not present

## 2019-12-06 DIAGNOSIS — H35372 Puckering of macula, left eye: Secondary | ICD-10-CM | POA: Diagnosis not present

## 2019-12-07 DIAGNOSIS — M545 Low back pain: Secondary | ICD-10-CM | POA: Diagnosis not present

## 2019-12-13 ENCOUNTER — Encounter: Payer: Self-pay | Admitting: Family Medicine

## 2019-12-14 DIAGNOSIS — M545 Low back pain: Secondary | ICD-10-CM | POA: Diagnosis not present

## 2019-12-23 DIAGNOSIS — H33311 Horseshoe tear of retina without detachment, right eye: Secondary | ICD-10-CM | POA: Diagnosis not present

## 2019-12-23 DIAGNOSIS — H43813 Vitreous degeneration, bilateral: Secondary | ICD-10-CM | POA: Diagnosis not present

## 2019-12-23 DIAGNOSIS — H33321 Round hole, right eye: Secondary | ICD-10-CM | POA: Diagnosis not present

## 2019-12-23 DIAGNOSIS — H35373 Puckering of macula, bilateral: Secondary | ICD-10-CM | POA: Diagnosis not present

## 2019-12-28 ENCOUNTER — Encounter: Payer: Self-pay | Admitting: Family Medicine

## 2019-12-28 DIAGNOSIS — M48062 Spinal stenosis, lumbar region with neurogenic claudication: Secondary | ICD-10-CM

## 2020-01-03 DIAGNOSIS — H33311 Horseshoe tear of retina without detachment, right eye: Secondary | ICD-10-CM | POA: Diagnosis not present

## 2020-01-23 ENCOUNTER — Ambulatory Visit: Payer: Self-pay

## 2020-01-23 ENCOUNTER — Ambulatory Visit: Payer: BC Managed Care – PPO | Admitting: Physical Medicine and Rehabilitation

## 2020-01-23 ENCOUNTER — Other Ambulatory Visit: Payer: Self-pay

## 2020-01-23 ENCOUNTER — Encounter: Payer: Self-pay | Admitting: Physical Medicine and Rehabilitation

## 2020-01-23 VITALS — BP 131/82 | HR 49

## 2020-01-23 DIAGNOSIS — M48062 Spinal stenosis, lumbar region with neurogenic claudication: Secondary | ICD-10-CM

## 2020-01-23 DIAGNOSIS — M5416 Radiculopathy, lumbar region: Secondary | ICD-10-CM | POA: Diagnosis not present

## 2020-01-23 MED ORDER — METHYLPREDNISOLONE ACETATE 80 MG/ML IJ SUSP
40.0000 mg | Freq: Once | INTRAMUSCULAR | Status: AC
  Administered 2020-01-23: 40 mg

## 2020-01-23 NOTE — Progress Notes (Signed)
Numeric Pain Rating Scale and Functional Assessment Average Pain: 4   In the last MONTH (on 0-10 scale) has pain interfered with the following?  1. General activity like being  able to carry out your everyday physical activities such as walking, climbing stairs, carrying groceries, or moving a chair?  Rating(4)   +Driver, -BT, -Dye Allergies.  Had a race this past weekend, pain in low back, down back of legs, bilateral buttocks, early mornings he has most of the pain, get better in the afternoons,

## 2020-01-24 NOTE — Procedures (Signed)
Lumbosacral Transforaminal Epidural Steroid Injection - Sub-Pedicular Approach with Fluoroscopic Guidance  Patient: Patrick Wall      Date of Birth: 09-Jul-1951 MRN: [id number] PCP: Donita Brooks, MD      Visit Date: 01/23/2020   Universal Protocol:    Date/Time: 01/23/2020  Consent Given By: the patient  Position: PRONE  Additional Comments: Vital signs were monitored before and after the procedure. Patient was prepped and draped in the usual sterile fashion. The correct patient, procedure, and site was verified.   Injection Procedure Details:  Procedure Site One Meds Administered:  Meds ordered this encounter  Medications  . methylPREDNISolone acetate (DEPO-MEDROL) injection 40 mg    Laterality: Bilateral  Location/Site:  L4-L5  Needle size: 22 G  Needle type: Spinal  Needle Placement: Transforaminal  Findings:    -Comments: Excellent flow of contrast along the nerve and into the epidural space.  Procedure Details: After squaring off the end-plates to get a true AP view, the C-arm was positioned so that an oblique view of the foramen as noted above was visualized. The target area is just inferior to the "nose of the scotty dog" or sub pedicular. The soft tissues overlying this structure were infiltrated with 2-3 ml. of 1% Lidocaine without Epinephrine.  The spinal needle was inserted toward the target using a "trajectory" view along the fluoroscope beam.  Under AP and lateral visualization, the needle was advanced so it did not puncture dura and was located close the 6 O'Clock position of the pedical in AP tracterory. Biplanar projections were used to confirm position. Aspiration was confirmed to be negative for CSF and/or blood. A 1-2 ml. volume of Isovue-250 was injected and flow of contrast was noted at each level. Radiographs were obtained for documentation purposes.   After attaining the desired flow of contrast documented above, a 0.5 to 1.0 ml test dose of  0.25% Marcaine was injected into each respective transforaminal space.  The patient was observed for 90 seconds post injection.  After no sensory deficits were reported, and normal lower extremity motor function was noted,   the above injectate was administered so that equal amounts of the injectate were placed at each foramen (level) into the transforaminal epidural space.   Additional Comments:  The patient tolerated the procedure well Dressing: 2 x 2 sterile gauze and Band-Aid    Post-procedure details: Patient was observed during the procedure. Post-procedure instructions were reviewed.  Patient left the clinic in stable condition.

## 2020-01-24 NOTE — Progress Notes (Signed)
Patrick Wall - 69 y.o. male MRN [id number]  Date of birth: 09-07-1950  Office Visit Note: Visit Date: 01/23/2020 PCP: Susy Frizzle, MD Referred by: Susy Frizzle, MD  Subjective: Chief Complaint  Patient presents with  . Lower Back - Pain   HPI:  Patrick Wall is a 69 y.o. male who comes in today At the request of Dr. Eunice Blase for planned Bilateral L4-L5 lumbar transforaminal epidural steroid injection with fluoroscopic guidance.  The patient has failed conservative care including home exercise, medications, time and activity modification.  This injection will be diagnostic and hopefully therapeutic.  Please see requesting physician notes for further details and justification.  Patient is a Therapist, occupational and is very functional despite having fairly significant lumbar stenosis multifactorial at L4-5.  He does get claudication type symptoms in referral into the hamstrings bilaterally.   ROS Otherwise per HPI.  Assessment & Plan: Visit Diagnoses:  1. Lumbar radiculopathy   2. Spinal stenosis of lumbar region with neurogenic claudication     Plan: No additional findings.   Meds & Orders:  Meds ordered this encounter  Medications  . methylPREDNISolone acetate (DEPO-MEDROL) injection 40 mg    Orders Placed This Encounter  Procedures  . XR C-ARM NO REPORT  . Epidural Steroid injection    Follow-up: Return if symptoms worsen or fail to improve.   Procedures: No procedures performed  Lumbosacral Transforaminal Epidural Steroid Injection - Sub-Pedicular Approach with Fluoroscopic Guidance  Patient: Patrick Wall      Date of Birth: 04/18/1951 MRN: [id number] PCP: Susy Frizzle, MD      Visit Date: 01/23/2020   Universal Protocol:    Date/Time: 01/23/2020  Consent Given By: the patient  Position: PRONE  Additional Comments: Vital signs were monitored before and after the procedure. Patient was prepped and draped in the usual sterile fashion. The  correct patient, procedure, and site was verified.   Injection Procedure Details:  Procedure Site One Meds Administered:  Meds ordered this encounter  Medications  . methylPREDNISolone acetate (DEPO-MEDROL) injection 40 mg    Laterality: Bilateral  Location/Site:  L4-L5  Needle size: 22 G  Needle type: Spinal  Needle Placement: Transforaminal  Findings:    -Comments: Excellent flow of contrast along the nerve and into the epidural space.  Procedure Details: After squaring off the end-plates to get a true AP view, the C-arm was positioned so that an oblique view of the foramen as noted above was visualized. The target area is just inferior to the "nose of the scotty dog" or sub pedicular. The soft tissues overlying this structure were infiltrated with 2-3 ml. of 1% Lidocaine without Epinephrine.  The spinal needle was inserted toward the target using a "trajectory" view along the fluoroscope beam.  Under AP and lateral visualization, the needle was advanced so it did not puncture dura and was located close the 6 O'Clock position of the pedical in AP tracterory. Biplanar projections were used to confirm position. Aspiration was confirmed to be negative for CSF and/or blood. A 1-2 ml. volume of Isovue-250 was injected and flow of contrast was noted at each level. Radiographs were obtained for documentation purposes.   After attaining the desired flow of contrast documented above, a 0.5 to 1.0 ml test dose of 0.25% Marcaine was injected into each respective transforaminal space.  The patient was observed for 90 seconds post injection.  After no sensory deficits were reported, and normal lower extremity motor function was noted,  the above injectate was administered so that equal amounts of the injectate were placed at each foramen (level) into the transforaminal epidural space.   Additional Comments:  The patient tolerated the procedure well Dressing: 2 x 2 sterile gauze and  Band-Aid    Post-procedure details: Patient was observed during the procedure. Post-procedure instructions were reviewed.  Patient left the clinic in stable condition.      Clinical History: MRI LUMBAR SPINE WITHOUT CONTRAST  TECHNIQUE: Multiplanar, multisequence MR imaging of the lumbar spine was performed. No intravenous contrast was administered.  COMPARISON:  None.  FINDINGS: Segmentation:  Standard.  Alignment:  Normal.  Vertebrae: No fracture, evidence of discitis, or bone lesion. Small Schmorl's node in the inferior endplate of L4 with surrounding marrow edema is noted.  Conus medullaris and cauda equina: Conus extends to the L1-2 level. Conus and cauda equina appear normal.  Paraspinal and other soft tissues: Renal cysts are noted.  Disc levels:  T12-L1: Negative.  L1-2: Negative.  L2-3: Negative.  L3-4: Mild-to-moderate facet degenerative disease. There is loss of disc space height and a shallow bulge. Mild central canal stenosis and moderate to moderately severe bilateral foraminal narrowing are seen.  L4-5: Moderate bilateral facet degenerative change. There is bulky ligamentum flavum thickening and a diffuse broad-based disc bulge. Moderately severe to severe central canal stenosis and bilateral subarticular recess narrowing are seen. There is also moderately severe bilateral foraminal narrowing.  L5-S1: There is some facet degenerative disease and a shallow disc bulge. The central canal and foramina remain open.  IMPRESSION: 1. 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 Moderately severe bilateral foraminal narrowing is also present at this level. 2. Mild central canal and moderate to moderately severe bilateral foraminal narrowing L3-4.   Electronically Signed   By: Drusilla Kanner M.D.   On: 09/23/2019 08:55      Objective:  VS:  HT:    WT:   BMI:     BP:131/82  HR:(!) 49bpm  TEMP: ( )  RESP:  Physical Exam   Imaging: XR C-ARM NO REPORT  Result Date: 01/23/2020 Please see Notes tab for imaging impression.

## 2020-03-14 ENCOUNTER — Other Ambulatory Visit: Payer: Self-pay

## 2020-03-14 ENCOUNTER — Telehealth (INDEPENDENT_AMBULATORY_CARE_PROVIDER_SITE_OTHER): Payer: BC Managed Care – PPO | Admitting: Nurse Practitioner

## 2020-03-14 DIAGNOSIS — J029 Acute pharyngitis, unspecified: Secondary | ICD-10-CM | POA: Diagnosis not present

## 2020-03-14 MED ORDER — AMOXICILLIN 875 MG PO TABS: 875.0000 mg | ORAL_TABLET | Freq: Two times a day (BID) | ORAL | 0 refills | Status: DC

## 2020-03-14 MED FILL — AMOXICILLIN 875 MG TABS: 875 | 10 days supply | Qty: 20 | Fill #0

## 2020-03-14 NOTE — Progress Notes (Addendum)
Virtual Visit via Telephone Note  I connected with Patrick Wall on 03/14/20 at 10:00 AM EDT by telephone and verified that I am speaking with the correct person using two identifiers.   I discussed the limitations, risks, security and privacy concerns of performing an evaluation and management service by telephone and the availability of in person appointments. I also discussed with the patient that there may be a patient responsible charge related to this service. The patient expressed understanding and agreed to proceed.   History of Present Illness:  Pt is a 69 year old male presenting for a telephone visit  He was located at his home and provider located at office [facility] locations, he is presenting for sxs of sore throat. The sore throat started one day ago. He woke in the AM with the sore throat. He denied other sxs such as fever, chills, nasal congestion or drainage, headache, loss of taste or smell. He reports he went on a 10 day [place] vacation, his granddaughter was diagnosed with strep throat the second day of visit. He is returning from the trip today.   No COVID positive contacts. No treatments tried. The pt would like antibiotic treatment related to exposure with onset of sxs. And cannot get into office being at airport now.    Past Medical History:  Diagnosis Date  . Hearing loss   . Hyperlipidemia   . Labral tear of shoulder    Past Surgical History:  Procedure Laterality Date  . EYE SURGERY  2000   lasic  . VASECTOMY  1983   Current Outpatient Medications on File Prior to Visit  Medication Sig Dispense Refill  . aspirin EC 81 MG tablet Take 81 mg by mouth daily.    Marland Kitchen atorvastatin (LIPITOR) 40 MG tablet Take 1 tablet (40 mg total) by mouth daily. 90 tablet 3  . Cholecalciferol (VITAMIN D-3) 125 MCG (5000 UT) TABS Take by mouth 3 (three) times a week.    . Multiple Vitamin (MULTIVITAMIN) capsule Take 1 capsule by mouth daily.     No current  facility-administered medications on file prior to visit.   Allergies  Allergen Reactions  . Sulfa Antibiotics    Social History   Socioeconomic History  . Marital status: Married    Spouse name: Not on file  . Number of children: Not on file  . Years of education: Not on file  . Highest education level: Not on file  Occupational History  . Not on file  Tobacco Use  . Smoking status: Never Smoker  . Smokeless tobacco: Never Used  Vaping Use  . Vaping Use: Never used  Substance and Sexual Activity  . Alcohol use: Yes    Alcohol/week: 9.0 standard drinks    Types: 5 Glasses of wine, 4 Cans of beer per week  . Drug use: No  . Sexual activity: Yes    Birth control/protection: Surgical    Comment: vasectomy  Other Topics Concern  . Not on file  Social History Narrative  . Not on file   Social Determinants of Health   Financial Resource Strain:   . Difficulty of Paying Living Expenses:   Food Insecurity:   . Worried About Programme researcher, broadcasting/film/video in the Last Year:   . Barista in the Last Year:   Transportation Needs:   . Freight forwarder (Medical):   Marland Kitchen Lack of Transportation (Non-Medical):   Physical Activity:   . Days of Exercise per  Week:   . Minutes of Exercise per Session:   Stress:   . Feeling of Stress :   Social Connections:   . Frequency of Communication with Friends and Family:   . Frequency of Social Gatherings with Friends and Family:   . Attends Religious Services:   . Active Member of Clubs or Organizations:   . Attends Banker Meetings:   Marland Kitchen Marital Status:   Intimate Partner Violence:   . Fear of Current or Ex-Partner:   . Emotionally Abused:   Marland Kitchen Physically Abused:   . Sexually Abused:    Breast Cancer-relatedfamily history is not on file. Immunization History  Administered Date(s) Administered  . Influenza Split 05/21/2015  . Influenza-Unspecified 06/02/2016, 05/18/2017  . Pneumococcal Conjugate-13 07/09/2017  .  Pneumococcal Polysaccharide-23 09/19/2014  . Tdap 04/01/2014  . Zoster 03/07/2014     Observations/Objective: Able to speak full sentences  Assessment and Plan: Pharyngitis, unspecified etiology - Plan: amoxicillin (AMOXIL) 875 MG tablet  Twice daily for 10 days start and completed take medication as prescribed, may take over the counter pain relief such as tylenol or ibuprofen, seek medical attention for new sxs, worsening or non resolving sxs. Get plenty of rest and drink plenty of water. You may be contagious with strep throat for 24-48 hours after starting the antibiotic. You should change bed linens at that time and replace toothbrushes. Wash hands frequently.  Follow Up Instructions:    I discussed the assessment and treatment plan with the patient. The patient was provided an opportunity to ask questions and all were answered. The patient agreed with the plan and demonstrated an understanding of the instructions.   The patient was advised to call back or seek an in-person evaluation if the symptoms worsen or if the condition fails to improve as anticipated.  I provided 10 minutes of non-face-to-face time during this encounter.  End time 10:10  Elmore Guise, FNP

## 2020-03-14 NOTE — Patient Instructions (Signed)
Pharyngitis, unspecified etiology - Plan: amoxicillin (AMOXIL) 875 MG tablet  Twice daily for 10 days start and completed take medication as prescribed, may take over the counter pain relief such as tylenol or ibuprofen, seek medical attention for new sxs, worsening or non resolving sxs. Get plenty of rest and drink plenty of water. You may be contagious with strep throat for 24-48 hours after starting the antibiotic. You should change bed linens at that time and replace toothbrushes. Wash hands frequently.

## 2020-03-17 ENCOUNTER — Encounter: Payer: Self-pay | Admitting: Family Medicine

## 2020-03-23 NOTE — Telephone Encounter (Signed)
Holding for [facility] for appt.

## 2020-04-10 ENCOUNTER — Other Ambulatory Visit: Payer: Self-pay

## 2020-04-10 ENCOUNTER — Other Ambulatory Visit: Payer: Medicare Other

## 2020-04-10 DIAGNOSIS — E785 Hyperlipidemia, unspecified: Secondary | ICD-10-CM

## 2020-04-10 DIAGNOSIS — I10 Essential (primary) hypertension: Secondary | ICD-10-CM

## 2020-04-10 LAB — CBC WITH DIFFERENTIAL/PLATELET
Absolute Monocytes: 437 cells/uL (ref 200–950)
Basophils Absolute: 22 cells/uL (ref 0–200)
Basophils Relative: 0.7 %
Eosinophils Absolute: 112 cells/uL (ref 15–500)
Eosinophils Relative: 3.6 %
HCT: 43.5 % (ref 38.5–50.0)
Hemoglobin: 14.6 g/dL (ref 13.2–17.1)
Lymphs Abs: 1479 cells/uL (ref 850–3900)
MCH: 31.5 pg (ref 27.0–33.0)
MCHC: 33.6 g/dL (ref 32.0–36.0)
MCV: 94 fL (ref 80.0–100.0)
MPV: 11.4 fL (ref 7.5–12.5)
Monocytes Relative: 14.1 %
Neutro Abs: 1051 cells/uL — ABNORMAL LOW (ref 1500–7800)
Neutrophils Relative %: 33.9 %
Platelets: 140 10*3/uL (ref 140–400)
RBC: 4.63 10*6/uL (ref 4.20–5.80)
RDW: 12.7 % (ref 11.0–15.0)
Total Lymphocyte: 47.7 %
WBC: 3.1 10*3/uL — ABNORMAL LOW (ref 3.8–10.8)

## 2020-04-11 LAB — LIPID PANEL
Cholesterol: 180 mg/dL (ref <200)
HDL: 95 mg/dL (ref >=40)
LDL Cholesterol (Calc): 72 mg/dL (calc)
Non-HDL Cholesterol (Calc): 85 mg/dL (calc) (ref <130)
Total CHOL/HDL Ratio: 1.9 (calc) (ref <5.0)
Triglycerides: 58 mg/dL (ref <150)

## 2020-04-11 LAB — COMPLETE METABOLIC PANEL WITH GFR
AG Ratio: 2 (calc) (ref 1.0–2.5)
ALT: 28 U/L (ref 9–46)
AST: 34 U/L (ref 10–35)
Albumin: 4.5 g/dL (ref 3.6–5.1)
Alkaline phosphatase (APISO): 33 U/L — ABNORMAL LOW (ref 35–144)
BUN: 22 mg/dL (ref 7–25)
CO2: 25 mmol/L (ref 20–32)
Calcium: 9.7 mg/dL (ref 8.6–10.3)
Chloride: 106 mmol/L (ref 98–110)
Creat: 1.09 mg/dL (ref 0.70–1.25)
GFR, Est African American: 81 mL/min/{1.73_m2} (ref >=60)
GFR, Est Non African American: 70 mL/min/{1.73_m2} (ref >=60)
Globulin: 2.2 g/dL (calc) (ref 1.9–3.7)
Glucose, Bld: 104 mg/dL — ABNORMAL HIGH (ref 65–99)
Potassium: 4.7 mmol/L (ref 3.5–5.3)
Sodium: 140 mmol/L (ref 135–146)
Total Bilirubin: 1.3 mg/dL — ABNORMAL HIGH (ref 0.2–1.2)
Total Protein: 6.7 g/dL (ref 6.1–8.1)

## 2020-04-25 ENCOUNTER — Encounter: Payer: Self-pay | Admitting: Specialist

## 2020-04-25 ENCOUNTER — Ambulatory Visit: Payer: Self-pay

## 2020-04-25 ENCOUNTER — Ambulatory Visit: Payer: BC Managed Care – PPO | Admitting: Specialist

## 2020-04-25 ENCOUNTER — Other Ambulatory Visit: Payer: Self-pay

## 2020-04-25 VITALS — BP 134/81 | HR 42 | Ht 75.5 in | Wt 197.4 lb

## 2020-04-25 DIAGNOSIS — M545 Low back pain, unspecified: Secondary | ICD-10-CM

## 2020-04-25 DIAGNOSIS — M48062 Spinal stenosis, lumbar region with neurogenic claudication: Secondary | ICD-10-CM | POA: Diagnosis not present

## 2020-04-25 DIAGNOSIS — G8929 Other chronic pain: Secondary | ICD-10-CM | POA: Diagnosis not present

## 2020-04-25 DIAGNOSIS — M4316 Spondylolisthesis, lumbar region: Secondary | ICD-10-CM | POA: Diagnosis not present

## 2020-04-25 NOTE — Patient Instructions (Addendum)
Avoid bending, stooping and avoid lifting weights greater than 10 lbs. Avoid prolong standing and walking. Order for a new walker with wheels. Surgery scheduling secretary Tivis Ringer, will call you in the next week to schedule for surgery.  Surgery recommended is a one level lumbar fusion L4-5 this would be done with rods, screws and cages with local bone graft and allograft (donor bone graft)and vivigen Take hydrocodone for for pain. Risk of surgery includes risk of infection 1 in 200 patients, bleeding 1/2% chance you would need a transfusion.   Risk to the nerves is one in 10,000. You will need to use a brace for 3 months and wean from the brace on the 4th month. Expect improved walking and standing tolerance. Expect relief of leg pain but numbness may persist depending on the length and degree of pressure that has been present.

## 2020-04-25 NOTE — Progress Notes (Signed)
Office Visit Note   Patient: Patrick Wall           Date of Birth: 14-Apr-1951           MRN: [id number] Visit Date: 04/25/2020              Requested by: Donita Brooks, MD [address] [facility] [address] [place],  [place] [address] PCP: Donita Brooks, MD   Assessment & Plan: Visit Diagnoses:  1. Spinal stenosis of lumbar region with neurogenic claudication   2. Chronic bilateral low back pain, unspecified whether sciatica present   3. Spondylolisthesis of lumbar region     Plan: Avoid bending, stooping and avoid lifting weights greater than 10 lbs. Avoid prolong standing and walking. Order for a new walker with wheels. Surgery scheduling secretary Tivis Ringer, will call you in the next week to schedule for surgery.  Surgery recommended is a one level lumbar fusion L4-5 this would be done with rods, screws and cages with local bone graft and allograft (donor bone graft) and vivigen. Take hydrocodone for for pain. Risk of surgery includes risk of infection 1 in 200 patients, bleeding 1/2% chance you would need a transfusion.   Risk to the nerves is one in 10,000. You will need to use a brace for 3 months and wean from the brace on the 4th month. Expect improved walking and standing tolerance. Expect relief of leg pain but numbness may persist depending on the length and degree of pressure that has been present.  Follow-Up Instructions: No follow-ups on file.   Orders:  Orders Placed This Encounter  Procedures  . XR Lumbar Spine 2-3 Views   No orders of the defined types were placed in this encounter.     Procedures: No procedures performed   Clinical Data: No additional findings.   Subjective: Chief Complaint  Patient presents with  . Lower Back - Pain    69 year old male with history of back pain that is worsening with standing and walking. 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  He has had times where the run was 2 miles and he could only run 1 mile and the had to walk the last mile. He has pain in the back and into the posterior thighs and buttocks into the knees. Improves with sitting and bending. No numbness and not really painful but the legs don't want to work. No bowel or bladder difficulty. He has seen a chiropractor and then it was knee difficulty and then it went to back. There is some pain with rolling over and getting up he has to steady himself to get going. Uses heat to improve stiffness in the AM.     Review of Systems  Constitutional: Negative.   HENT: Negative.   Eyes: Negative.   Respiratory: Positive for cough.   Cardiovascular: Negative.   Gastrointestinal: Negative.   Endocrine: Negative.   Genitourinary: Negative.  Negative for dysuria and frequency.  Musculoskeletal: Positive for back pain and gait problem. Negative for arthralgias, joint swelling, myalgias, neck pain and neck stiffness.  Skin: Negative.  Negative for color change, pallor, rash and wound.  Allergic/Immunologic: Negative.  Negative for environmental allergies, food allergies and immunocompromised state.  Neurological: Negative for dizziness, tremors, seizures, syncope, facial asymmetry, speech difficulty, weakness, light-headedness, numbness and headaches.  Hematological: Negative.  Negative for adenopathy. Does not bruise/bleed  easily.  Psychiatric/Behavioral: Negative.  Negative for agitation, behavioral problems, confusion, decreased concentration, dysphoric mood, hallucinations, self-injury and sleep disturbance. The patient is not nervous/anxious and is not hyperactive.      Objective: Vital Signs: BP 134/81 (BP Location: Left Arm, Patient Position: Sitting)   Pulse (!) 42   Ht 6' 3.5" (1.918 m)   Wt 197 lb 6.4 oz (89.5 kg)   BMI 24.35 kg/m   Physical Exam Constitutional:      General: He is not in acute distress.    Appearance: Normal appearance. He is  well-developed and normal weight. He is not ill-appearing, toxic-appearing or diaphoretic.  HENT:     Head: Normocephalic and atraumatic.     Nose: No congestion or rhinorrhea.     Mouth/Throat:     Pharynx: No oropharyngeal exudate or posterior oropharyngeal erythema.  Eyes:     General:        Right eye: No discharge.        Left eye: No discharge.     Pupils: Pupils are equal, round, and reactive to light.  Pulmonary:     Effort: Pulmonary effort is normal.     Breath sounds: Normal breath sounds.  Abdominal:     General: Bowel sounds are normal.     Palpations: Abdomen is soft.  Musculoskeletal:        General: Normal range of motion.     Cervical back: Normal range of motion and neck supple.  Skin:    General: Skin is warm and dry.  Neurological:     Mental Status: He is alert and oriented to person, place, and time.  Psychiatric:        Behavior: Behavior normal.        Thought Content: Thought content normal.        Judgment: Judgment normal.     Ortho Exam  Specialty Comments:  No specialty comments available.  Imaging: No results found.   PMFS History: Patient Active Problem List   Diagnosis Date Noted  . Bilateral knee pain 06/03/2016  . Abnormality of gait 06/03/2016  . Left foot pain 03/18/2016  . Labral tear of shoulder   . Hyperlipidemia   . Hypertension   . Hearing loss   . 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 Hearing loss   . Hyperlipidemia   . Labral tear of shoulder     Family History  Problem Relation Age of Onset  . Cancer Mother        skin  . Heart disease Mother   . Vision loss Mother   . Early death Father   . Arthritis Maternal Grandmother   . Cancer Maternal Grandmother   . Hyperlipidemia Maternal Grandmother   . Hypertension Maternal Grandmother   . Cancer Maternal Grandfather   . Heart disease Paternal Grandfather   . Stroke Paternal Grandfather     Past Surgical History:   Procedure Laterality Date  . EYE SURGERY  2000   lasic  . VASECTOMY  1983   Social History   Occupational History  . Not on file  Tobacco Use  . Smoking status: Never Smoker  . Smokeless tobacco: Never Used  Vaping Use  . Vaping Use: Never used  Substance and Sexual Activity  . Alcohol use: Yes    Alcohol/week: 9.0 standard drinks    Types: 5 Glasses of wine, 4 Cans of beer per week  . Drug use: No  .  Sexual activity: Yes    Birth control/protection: Surgical    Comment: vasectomy

## 2020-04-26 NOTE — Addendum Note (Signed)
Addended by: [name] on: 04/26/2020 05:51 PM   Modules accepted: Orders

## 2020-04-30 ENCOUNTER — Ambulatory Visit (INDEPENDENT_AMBULATORY_CARE_PROVIDER_SITE_OTHER): Payer: BC Managed Care – PPO | Admitting: Family Medicine

## 2020-04-30 ENCOUNTER — Encounter: Payer: Self-pay | Admitting: Family Medicine

## 2020-04-30 ENCOUNTER — Other Ambulatory Visit: Payer: Self-pay

## 2020-04-30 VITALS — BP 120/70 | HR 49 | Temp 97.4°F | Ht 75.0 in | Wt 194.0 lb

## 2020-04-30 DIAGNOSIS — Z0000 Encounter for general adult medical examination without abnormal findings: Secondary | ICD-10-CM

## 2020-04-30 DIAGNOSIS — Z0001 Encounter for general adult medical examination with abnormal findings: Secondary | ICD-10-CM

## 2020-04-30 DIAGNOSIS — I10 Essential (primary) hypertension: Secondary | ICD-10-CM | POA: Diagnosis not present

## 2020-04-30 DIAGNOSIS — M48061 Spinal stenosis, lumbar region without neurogenic claudication: Secondary | ICD-10-CM

## 2020-04-30 DIAGNOSIS — Z125 Encounter for screening for malignant neoplasm of prostate: Secondary | ICD-10-CM | POA: Diagnosis not present

## 2020-04-30 DIAGNOSIS — R17 Unspecified jaundice: Secondary | ICD-10-CM

## 2020-04-30 DIAGNOSIS — Z23 Encounter for immunization: Secondary | ICD-10-CM | POA: Diagnosis not present

## 2020-04-30 DIAGNOSIS — E785 Hyperlipidemia, unspecified: Secondary | ICD-10-CM | POA: Diagnosis not present

## 2020-04-30 MED ORDER — ATORVASTATIN CALCIUM 40 MG PO TABS: 40.0000 mg | ORAL_TABLET | Freq: Every day | ORAL | 3 refills | Status: DC

## 2020-04-30 NOTE — Addendum Note (Signed)
Addended by: Roxy Cedar on: 04/30/2020 12:27 PM   Modules accepted: Orders

## 2020-04-30 NOTE — Progress Notes (Signed)
Subjective:    Patient ID: Patrick Wall, male    DOB: [birth year], 69 y.o.   MRN: [id number]  HPI  Patient is a very pleasant 69 year old Caucasian male here today for complete physical exam.  His last colonoscopy was in 2014.  Previously he thought he had a history of polyps however we reviewed his most recent colonoscopy report and the patient had no polyps or abnormalities found.  I am not certain why they recommended a repeat colonoscopy in 5 years.  He has no family history of colon cancer and he denies any history of polyps.  Therefore it seems that he would be appropriate for a 10-year screening interval.  We discussed performing the colonoscopy now versus waiting until 2024.  He is due to repeat a PSA at the end of September.  He is due for a flu shot this fall.  He is due for a booster on his Covid vaccination in September 30.  Otherwise he is doing well.  He denies any falls, depression, or memory loss.  He is due for a booster on his Pneumovax 23 today.  Unfortunately, the patient has been dealing with severe lumbar spinal stenosis.  He is scheduling surgery with Dr. Otelia Sergeant.  This is pending. Immunization History  Administered Date(s) Administered  . Influenza Split 05/21/2015  . Influenza-Unspecified 06/02/2016, 05/18/2017  . Pneumococcal Conjugate-13 07/09/2017  . Pneumococcal Polysaccharide-23 09/19/2014  . Tdap 04/01/2014  . Zoster 03/07/2014    No visits with results within 1 Week(s) from this visit.  Latest known visit with results is:  Orders Only on 04/10/2020  Component Date Value Ref Range Status  . Glucose, Bld 04/10/2020 104* 65 - 99 mg/dL Final   Comment: .            Fasting reference interval . For someone without known diabetes, a glucose value between 100 and 125 mg/dL is consistent with prediabetes and should be confirmed with a follow-up test. .   . BUN 04/10/2020 22  7 - 25 mg/dL Final  . Creat 23/76/2831 1.09  0.70 - 1.25 mg/dL Final   Comment: For  patients >40 years of age, the reference limit for Creatinine is approximately 13% higher for people identified as African-American. .   . GFR, Est Non African American 04/10/2020 70  > OR = 60 mL/min/1.66m2 Final  . GFR, Est African American 04/10/2020 81  > OR = 60 mL/min/1.46m2 Final  . BUN/Creatinine Ratio 04/10/2020 NOT APPLICABLE  6 - 22 (calc) Final  . Sodium 04/10/2020 140  135 - 146 mmol/L Final  . Potassium 04/10/2020 4.7  3.5 - 5.3 mmol/L Final  . Chloride 04/10/2020 106  98 - 110 mmol/L Final  . CO2 04/10/2020 25  20 - 32 mmol/L Final  . Calcium 04/10/2020 9.7  8.6 - 10.3 mg/dL Final  . Total Protein 04/10/2020 6.7  6.1 - 8.1 g/dL Final  . Albumin 51/76/1607 4.5  3.6 - 5.1 g/dL Final  . Globulin 37/06/6268 2.2  1.9 - 3.7 g/dL (calc) Final  . AG Ratio 04/10/2020 2.0  1.0 - 2.5 (calc) Final  . Total Bilirubin 04/10/2020 1.3* 0.2 - 1.2 mg/dL Final  . Alkaline phosphatase (APISO) 04/10/2020 33* 35 - 144 U/L Final  . AST 04/10/2020 34  10 - 35 U/L Final  . ALT 04/10/2020 28  9 - 46 U/L Final  . Cholesterol 04/10/2020 180  <200 mg/dL Final  . HDL 48/54/6270 95  > OR = 40 mg/dL Final  .  Triglycerides 04/10/2020 58  <150 mg/dL Final  . LDL Cholesterol (Calc) 04/10/2020 72  mg/dL (calc) Final   Comment: Reference range: <100 . Desirable range <100 mg/dL for primary prevention;   <70 mg/dL for patients with CHD or diabetic patients  with > or = 2 CHD risk factors. Marland Kitchen LDL-C is now calculated using the Martin-Hopkins  calculation, which is a validated novel method providing  better accuracy than the Friedewald equation in the  estimation of LDL-C.  Horald Pollen et al. Lenox Ahr. 4332;951(88): 2061-2068  (http://education.[facility].com/faq/FAQ164)   . Total CHOL/HDL Ratio 04/10/2020 1.9  <4.1 (calc) Final  . Non-HDL Cholesterol (Calc) 04/10/2020 85  <130 mg/dL (calc) Final   Comment: For patients with diabetes plus 1 major ASCVD risk  factor, treating to a non-HDL-C goal of <100  mg/dL  (LDL-C of <66 mg/dL) is considered a therapeutic  option.   . WBC 04/10/2020 3.1* 3.8 - 10.8 Thousand/uL Final  . RBC 04/10/2020 4.63  4.20 - 5.80 Million/uL Final  . Hemoglobin 04/10/2020 14.6  13.2 - 17.1 g/dL Final  . HCT 02/29/1600 43.5  38 - 50 % Final  . MCV 04/10/2020 94.0  80.0 - 100.0 fL Final  . MCH 04/10/2020 31.5  27.0 - 33.0 pg Final  . MCHC 04/10/2020 33.6  32.0 - 36.0 g/dL Final  . RDW 09/32/3557 12.7  11.0 - 15.0 % Final  . Platelets 04/10/2020 140  140 - 400 Thousand/uL Final  . MPV 04/10/2020 11.4  7.5 - 12.5 fL Final  . Neutro Abs 04/10/2020 1,051* 1,500 - 7,800 cells/uL Final  . Lymphs Abs 04/10/2020 1,479  850 - 3,900 cells/uL Final  . Absolute Monocytes 04/10/2020 437  200 - 950 cells/uL Final  . Eosinophils Absolute 04/10/2020 112  15 - 500 cells/uL Final  . Basophils Absolute 04/10/2020 22  0 - 200 cells/uL Final  . Neutrophils Relative % 04/10/2020 33.9  % Final  . Total Lymphocyte 04/10/2020 47.7  % Final  . Monocytes Relative 04/10/2020 14.1  % Final  . Eosinophils Relative 04/10/2020 3.6  % Final  . Basophils Relative 04/10/2020 0.7  % Final    Past Medical History:  Diagnosis Date  . Hearing loss   . Hyperlipidemia   . Labral tear of shoulder    Past Surgical History:  Procedure Laterality Date  . EYE SURGERY  2000   lasic  . VASECTOMY  1983   Current Outpatient Medications on File Prior to Visit  Medication Sig Dispense Refill  . aspirin EC 81 MG tablet Take 81 mg by mouth daily.    Marland Kitchen atorvastatin (LIPITOR) 40 MG tablet Take 1 tablet (40 mg total) by mouth daily. 90 tablet 3  . Cholecalciferol (VITAMIN D-3) 125 MCG (5000 UT) TABS Take by mouth 3 (three) times a week.    . Multiple Vitamin (MULTIVITAMIN) capsule Take 1 capsule by mouth daily.     No current facility-administered medications on file prior to visit.   Allergies  Allergen Reactions  . Sulfa Antibiotics    Social History   Socioeconomic History  . Marital status:  Married    Spouse name: Not on file  . Number of children: Not on file  . Years of education: Not on file  . Highest education level: Not on file  Occupational History  . Not on file  Tobacco Use  . Smoking status: Never Smoker  . Smokeless tobacco: Never Used  Vaping Use  . Vaping Use: Never used  Substance and Sexual  Activity  . Alcohol use: Yes    Alcohol/week: 9.0 standard drinks    Types: 5 Glasses of wine, 4 Cans of beer per week  . Drug use: No  . Sexual activity: Yes    Birth control/protection: Surgical    Comment: vasectomy  Other Topics Concern  . Not on file  Social History Narrative  . Not on file   Social Determinants of Health   Financial Resource Strain:   . Difficulty of Paying Living Expenses: Not on file  Food Insecurity:   . Worried About Programme researcher, broadcasting/film/video in the Last Year: Not on file  . Ran Out of Food in the Last Year: Not on file  Transportation Needs:   . Lack of Transportation (Medical): Not on file  . Lack of Transportation (Non-Medical): Not on file  Physical Activity:   . Days of Exercise per Week: Not on file  . Minutes of Exercise per Session: Not on file  Stress:   . Feeling of Stress : Not on file  Social Connections:   . Frequency of Communication with Friends and Family: Not on file  . Frequency of Social Gatherings with Friends and Family: Not on file  . Attends Religious Services: Not on file  . Active Member of Clubs or Organizations: Not on file  . Attends Banker Meetings: Not on file  . Marital Status: Not on file  Intimate Partner Violence:   . Fear of Current or Ex-Partner: Not on file  . Emotionally Abused: Not on file  . Physically Abused: Not on file  . Sexually Abused: Not on file   Family History  Problem Relation Age of Onset  . Cancer Mother        skin  . Heart disease Mother   . Vision loss Mother   . Early death Father   . Arthritis Maternal Grandmother   . Cancer Maternal Grandmother   .  Hyperlipidemia Maternal Grandmother   . Hypertension Maternal Grandmother   . Cancer Maternal Grandfather   . Heart disease Paternal Grandfather   . Stroke Paternal Grandfather       Review of Systems  All other systems reviewed and are negative.      Objective:   Physical Exam Vitals reviewed.  Constitutional:      General: He is not in acute distress.    Appearance: He is well-developed. He is not diaphoretic.  HENT:     Head: Normocephalic and atraumatic.     Right Ear: External ear normal.     Left Ear: External ear normal.     Nose: Nose normal.     Mouth/Throat:     Pharynx: No oropharyngeal exudate.  Eyes:     General: No scleral icterus.       Right eye: No discharge.        Left eye: No discharge.     Conjunctiva/sclera: Conjunctivae normal.     Pupils: Pupils are equal, round, and reactive to light.  Neck:     Thyroid: No thyromegaly.     Vascular: No JVD.     Trachea: No tracheal deviation.  Cardiovascular:     Rate and Rhythm: Normal rate and regular rhythm.     Heart sounds: Normal heart sounds. No murmur heard.  No friction rub. No gallop.   Pulmonary:     Effort: Pulmonary effort is normal. No respiratory distress.     Breath sounds: Normal breath sounds. No wheezing or rales.  Chest:     Chest wall: No tenderness.  Abdominal:     General: Bowel sounds are normal. There is no distension.     Palpations: Abdomen is soft. There is no mass.     Tenderness: There is no abdominal tenderness. There is no guarding or rebound.  Musculoskeletal:     Cervical back: Normal range of motion and neck supple.  Lymphadenopathy:     Cervical: No cervical adenopathy.  Skin:    General: Skin is warm.     Coloration: Skin is not pale.     Findings: No erythema or rash.  Neurological:     Mental Status: He is alert and oriented to person, place, and time.     Cranial Nerves: No cranial nerve deficit.     Motor: No abnormal muscle tone.     Coordination:  Coordination normal.     Deep Tendon Reflexes: Reflexes normal.  Psychiatric:        Behavior: Behavior normal.        Thought Content: Thought content normal.        Judgment: Judgment normal.           Assessment & Plan:  Physical exam, annual  Screening PSA (prostate specific antigen)  Essential hypertension  Hyperlipidemia, unspecified hyperlipidemia type  Spinal stenosis of lumbar region without neurogenic claudication  physical exam is outstanding aside from the pain in his lower back.  Recheck a PSA at the end of September.  Check a bilirubin again at the end of September.  I believe this was likely lab fluctuation and not a sign of an underlying problem given his normal liver function test.  Cholesterol is outstanding.  Colonoscopy is up-to-date.  He received Pneumovax 23 today.  The remainder of his medical care is up-to-date.  I recommended a booster on his Covid shot at the end of September.  I recommended a flu shot this fall as well.

## 2020-05-03 ENCOUNTER — Encounter: Payer: Self-pay | Admitting: Specialist

## 2020-06-11 ENCOUNTER — Encounter: Payer: Self-pay | Admitting: Specialist

## 2020-08-01 ENCOUNTER — Encounter: Payer: Self-pay | Admitting: Specialist

## 2020-08-01 ENCOUNTER — Ambulatory Visit: Payer: BC Managed Care – PPO | Admitting: Specialist

## 2020-08-01 ENCOUNTER — Other Ambulatory Visit: Payer: Self-pay

## 2020-08-01 VITALS — BP 129/86 | HR 57 | Ht 75.0 in | Wt 194.0 lb

## 2020-08-01 DIAGNOSIS — M4316 Spondylolisthesis, lumbar region: Secondary | ICD-10-CM

## 2020-08-01 DIAGNOSIS — M4807 Spinal stenosis, lumbosacral region: Secondary | ICD-10-CM

## 2020-08-01 DIAGNOSIS — M48062 Spinal stenosis, lumbar region with neurogenic claudication: Secondary | ICD-10-CM | POA: Diagnosis not present

## 2020-08-01 NOTE — Progress Notes (Signed)
Office Visit Note   Patient: Patrick Wall           Date of Birth: 1950/10/10           MRN: [id number] Visit Date: 08/01/2020              Requested by: Donita Brooks, MD [address] [facility] [address] [place],  [place] [address] PCP: Donita Brooks, MD   Assessment & Plan: Visit Diagnoses:  1. Spondylolisthesis of lumbar region   2. Spinal stenosis of lumbosacral region   3. Spinal stenosis of lumbar region with neurogenic claudication     Plan: Order an updated MRI as the last MRI was done 09/23/2019 and is old Avoid prolong standing and walking. Order for a new walker with wheels. Surgery scheduling secretary Tivis Ringer, will call you in the next week to schedule for surgery.  Surgery recommended is a one level lumbar fusion L4-5 this would be done with rods, screws and cages with local bone graft and allograft (donor bone graft) and vivigen. Take hydrocodone for for pain. Risk of surgery includes risk of infection 1 in 200 patients, bleeding 1/2% chance you would need a transfusion.   Risk to the nerves is one in 10,000. You will need to use a brace for 3 months and wean from the brace on the 4th month. Expect improved walking and standing tolerance. Expect relief of leg pain but numbness may persist depending on the length and degree of pressure that has been present.  Follow-Up Instructions: No follow-ups on file.   Orders:  No orders of the defined types were placed in this encounter.  No orders of the defined types were placed in this encounter.     Procedures: No procedures performed   Clinical Data: No additional findings.   Subjective: Chief Complaint  Patient presents with   Lower Back - Follow-up    70 year old male with history of severe episodic back pain with pain localizing to the lumbar spine and the buttocks and posterior thighs to the knees. No pain with bike riding and is using a trainer a Siris, Consulting civil engineer. He is using to try various [employer] trails. Unable to jog more than 50 feet before the pain into the buttocks and backs of thighs force him to walk and stoop. MRI in the past done 09/23/2019 shows    Review of Systems  Constitutional: Positive for unexpected weight change (weight gain).  HENT: Positive for hearing loss.   Eyes: Negative.   Respiratory: Negative.   Cardiovascular: Negative.   Gastrointestinal: Negative.   Endocrine: Negative.  Negative for cold intolerance, heat intolerance, polydipsia, polyphagia and polyuria.  Genitourinary: Negative.  Negative for difficulty urinating, enuresis and flank pain.  Musculoskeletal: Positive for back pain and gait problem. Negative for joint swelling, myalgias, neck pain and neck stiffness.  Skin: Negative.  Negative for color change, pallor, rash and wound.  Allergic/Immunologic: Negative.  Negative for environmental allergies, food allergies and immunocompromised state.  Neurological: Positive for weakness and numbness. Negative for dizziness, tremors, seizures, syncope, facial asymmetry, speech difficulty, light-headedness and headaches.  Hematological: Negative for adenopathy. Does not bruise/bleed easily.  Psychiatric/Behavioral: Negative.      Objective: Vital Signs: BP 129/86 (BP Location: Left Arm, Patient Position: Sitting)   Pulse (!) 57   Ht 6\' 3"  (1.905 m)   Wt 194 lb (88 kg)   BMI 24.25 kg/m   Physical Exam Constitutional:  Appearance: He is well-developed.  HENT:     Head: Normocephalic and atraumatic.  Eyes:     Pupils: Pupils are equal, round, and reactive to light.  Pulmonary:     Effort: Pulmonary effort is normal.     Breath sounds: Normal breath sounds.  Abdominal:     General: Bowel sounds are normal.     Palpations: Abdomen is soft.  Musculoskeletal:     Cervical back: Normal range of motion and neck supple.     Lumbar back: Positive right straight leg raise test and positive left straight leg raise test.  Skin:    General: Skin  is warm and dry.  Neurological:     Mental Status: He is alert and oriented to person, place, and time.  Psychiatric:        Behavior: Behavior normal.        Thought Content: Thought content normal.        Judgment: Judgment normal.     Back Exam   Tenderness  The patient is experiencing tenderness in the lumbar.  Range of Motion  Extension: abnormal  Flexion: normal  Lateral bend right: abnormal  Lateral bend left: abnormal  Rotation right: abnormal  Rotation left: abnormal   Muscle Strength  Right Quadriceps:  5/5  Left Quadriceps:  5/5  Right Hamstrings:  5/5  Left Hamstrings:  5/5   Tests  Straight leg raise right: positive Straight leg raise left: positive  Reflexes  Patellar: 2/4 Achilles: 0/4  Other  Toe walk: normal Heel walk: normal Sensation: normal Gait: normal  Erythema: no back redness Scars: present      Specialty Comments:  No specialty comments available.  Imaging: No results found.   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   Past Medical History:  Diagnosis Date   Hearing loss    Hyperlipidemia    Labral tear of shoulder    Lumbar spinal stenosis     Family History  Problem Relation Age of Onset   Cancer Mother        skin   Heart disease Mother    Vision loss Mother    Early death Father    Arthritis Maternal Grandmother    Cancer Maternal Grandmother    Hyperlipidemia Maternal Grandmother    Hypertension Maternal Grandmother    Cancer Maternal Grandfather    Heart disease Paternal Grandfather    Stroke Paternal Grandfather     Past Surgical History:  Procedure Laterality Date   EYE SURGERY  2000   lasic   VASECTOMY  1983   Social History    Occupational History   Not on file  Tobacco Use   Smoking status: Never Smoker   Smokeless tobacco: Never Used  Vaping Use   Vaping Use: Never used  Substance and Sexual Activity   Alcohol use: Yes    Alcohol/week: 9.0 standard drinks    Types: 5 Glasses of wine, 4 Cans of beer per week   Drug use: No   Sexual activity: Yes    Birth control/protection: Surgical    Comment: vasectomy

## 2020-08-03 ENCOUNTER — Other Ambulatory Visit: Payer: Self-pay

## 2020-08-07 ENCOUNTER — Encounter: Payer: Self-pay | Admitting: Specialist

## 2020-08-09 ENCOUNTER — Ambulatory Visit
Admission: RE | Admit: 2020-08-09 | Discharge: 2020-08-09 | Disposition: A | Discharge status: Home / Self Care | Payer: BC Managed Care – PPO | Source: Ambulatory Visit | Attending: Specialist | Admitting: Specialist

## 2020-08-09 ENCOUNTER — Other Ambulatory Visit: Payer: Self-pay

## 2020-08-09 DIAGNOSIS — M545 Low back pain, unspecified: Secondary | ICD-10-CM | POA: Diagnosis not present

## 2020-08-09 DIAGNOSIS — M4807 Spinal stenosis, lumbosacral region: Secondary | ICD-10-CM

## 2020-08-09 DIAGNOSIS — M48061 Spinal stenosis, lumbar region without neurogenic claudication: Secondary | ICD-10-CM | POA: Diagnosis not present

## 2020-08-15 ENCOUNTER — Ambulatory Visit: Payer: BC Managed Care – PPO | Admitting: Surgery

## 2020-08-15 ENCOUNTER — Encounter: Payer: Self-pay | Admitting: Surgery

## 2020-08-15 ENCOUNTER — Other Ambulatory Visit: Payer: Self-pay

## 2020-08-15 VITALS — BP 112/72 | HR 59 | Ht 75.0 in

## 2020-08-15 DIAGNOSIS — M4316 Spondylolisthesis, lumbar region: Secondary | ICD-10-CM

## 2020-08-15 DIAGNOSIS — M48062 Spinal stenosis, lumbar region with neurogenic claudication: Secondary | ICD-10-CM

## 2020-08-15 NOTE — Progress Notes (Signed)
69 year old white male history of L4-5 spondylolisthesis/stenosis comes in for preop evaluation.  He continues have ongoing low back pain and bilateral lower extremity radiculopathy.  Symptoms unchanged in previous visit.  He is wanting to proceed with TRANSFORAMINAL LUMBAR INTERBODY FUSION LEFT L4-5 WITH DEPUY MPACT SCREWS RODS AND ADJUSTABLE HEIGHT CAGE, LOCAL BONE GRAFT, ALLOGRAFT BONE GRAFT, VIVIGEN.  Today history and physical performed.  Review of systems negative.  Surgical procedure discussed along with potential recovery time.  All questions answered.

## 2020-08-17 ENCOUNTER — Other Ambulatory Visit (HOSPITAL_COMMUNITY)
Admission: RE | Admit: 2020-08-17 | Discharge: 2020-08-17 | Disposition: A | Discharge status: Home / Self Care | Payer: BC Managed Care – PPO | Source: Ambulatory Visit | Attending: Specialist | Admitting: Specialist

## 2020-08-17 ENCOUNTER — Encounter (HOSPITAL_COMMUNITY): Payer: Self-pay

## 2020-08-17 ENCOUNTER — Other Ambulatory Visit: Payer: Self-pay

## 2020-08-17 ENCOUNTER — Encounter (HOSPITAL_COMMUNITY)
Admission: RE | Admit: 2020-08-17 | Discharge: 2020-08-17 | Disposition: A | Discharge status: Home / Self Care | Payer: BC Managed Care – PPO | Source: Ambulatory Visit | Attending: Specialist | Admitting: Specialist

## 2020-08-17 DIAGNOSIS — Z20822 Contact with and (suspected) exposure to covid-19: Secondary | ICD-10-CM | POA: Insufficient documentation

## 2020-08-17 DIAGNOSIS — Z01812 Encounter for preprocedural laboratory examination: Secondary | ICD-10-CM | POA: Insufficient documentation

## 2020-08-17 HISTORY — DX: Gastro-esophageal reflux disease without esophagitis: K21.9

## 2020-08-17 HISTORY — DX: Personal history of urinary calculi: Z87.442

## 2020-08-17 LAB — TYPE AND SCREEN
ABO/RH(D): A POS
Antibody Screen: NEGATIVE

## 2020-08-17 LAB — COMPREHENSIVE METABOLIC PANEL
ALT: 28 U/L (ref 0–44)
AST: 32 U/L (ref 15–41)
Albumin: 4.3 g/dL (ref 3.5–5.0)
Alkaline Phosphatase: 31 U/L — ABNORMAL LOW (ref 38–126)
Anion gap: 8 (ref 5–15)
BUN: 20 mg/dL (ref 8–23)
CO2: 27 mmol/L (ref 22–32)
Calcium: 9.4 mg/dL (ref 8.9–10.3)
Chloride: 106 mmol/L (ref 98–111)
Creatinine, Ser: 1.06 mg/dL (ref 0.61–1.24)
GFR, Estimated: >60 mL/min (ref >60)
Glucose, Bld: 82 mg/dL (ref 70–99)
Potassium: 4.2 mmol/L (ref 3.5–5.1)
Sodium: 141 mmol/L (ref 135–145)
Total Bilirubin: 1.2 mg/dL (ref 0.3–1.2)
Total Protein: 6.8 g/dL (ref 6.5–8.1)

## 2020-08-17 LAB — CBC
HCT: 44.2 % (ref 39.0–52.0)
Hemoglobin: 14.7 g/dL (ref 13.0–17.0)
MCH: 31.6 pg (ref 26.0–34.0)
MCHC: 33.3 g/dL (ref 30.0–36.0)
MCV: 95.1 fL (ref 80.0–100.0)
Platelets: 175 10*3/uL (ref 150–400)
RBC: 4.65 MIL/uL (ref 4.22–5.81)
RDW: 12.7 % (ref 11.5–15.5)
WBC: 3.1 10*3/uL — ABNORMAL LOW (ref 4.0–10.5)
nRBC: 0 % (ref 0.0–0.2)

## 2020-08-17 LAB — URINALYSIS, ROUTINE W REFLEX MICROSCOPIC
Bilirubin Urine: NEGATIVE
Glucose, UA: NEGATIVE mg/dL
Hgb urine dipstick: NEGATIVE
Ketones, ur: NEGATIVE mg/dL
Leukocytes,Ua: NEGATIVE
Nitrite: NEGATIVE
Protein, ur: NEGATIVE mg/dL
Specific Gravity, Urine: 1.026 (ref 1.005–1.030)
pH: 5 (ref 5.0–8.0)

## 2020-08-17 LAB — PROTIME-INR
INR: 1 (ref 0.8–1.2)
Prothrombin Time: 12.4 seconds (ref 11.4–15.2)

## 2020-08-17 LAB — SURGICAL PCR SCREEN
MRSA, PCR: NEGATIVE
Staphylococcus aureus: POSITIVE — AB

## 2020-08-17 LAB — SARS CORONAVIRUS 2 (TAT 6-24 HRS): SARS Coronavirus 2: NEGATIVE

## 2020-08-17 MED ORDER — BUPIVACAINE LIPOSOME 1.3 % IJ SUSP
20.0000 mL | Freq: Once | INTRAMUSCULAR | Status: AC
  Administered 2020-08-20: 20 mL
  Filled 2020-08-17: qty 20

## 2020-08-17 NOTE — Pre-Procedure Instructions (Signed)
Patrick Wall  08/17/2020    Your procedure is scheduled on Monday, August 20, 2020 at 7:30 AM.   Report to [facility] Entrance "A" Admitting Office at 5:30 AM.   Call this number if you have problems the morning of surgery: [phone]   Remember:  Do not eat food after midnight Sunday, 08/19/20.  You may drink clear liquids until 4:30 AM.  Clear liquids allowed are: Water, Juice (non-citric and without pulp - diabetics please choose diet or no sugar options), Carbonated beverages - (diabetics please choose diet or no sugar options), Clear Tea, Black Coffee only (no creamer, milk or cream including half and half) and Gatorade (diabetics please choose diet or no sugar options)   Drink the Pre-Surgery Ensure between 4:15 AM and 4:30 AM. This will be the last liquid you will have prior to surgery.    Take these medicines the morning of surgery with A SIP OF WATER: Atorvastatin (Lipitor)  Stop Aspirin as instructed by physician/surgeon. Stop Vitamins as of today. Do not use NSAIDS (Ibuprofen, Aleve, etc), Aspirin containing products, Herbal medications and Fish Oil.    Do not wear jewelry.  Do not wear lotions, powders, cologne or deodorant.  Men may shave face and neck.  Do not bring valuables to the hospital.  [facility] is not responsible for any belongings or valuables.  Contacts, dentures or bridgework may not be worn into surgery.  Leave your suitcase in the car.  After surgery it may be brought to your room.  For patients admitted to the hospital, discharge time will be determined by your treatment team.  Patients discharged the day of surgery will not be allowed to drive home.   [facility] - Preparing for Surgery  Before surgery, you can play an important role.  Because skin is not sterile, your skin needs to be as free of germs as possible.  You can reduce the number of germs on you skin by washing with CHG (chlorahexidine gluconate) soap before surgery.  CHG is an  antiseptic cleaner which kills germs and bonds with the skin to continue killing germs even after washing.  Oral Hygiene is also important in reducing the risk of infection.  Remember to brush your teeth with your regular toothpaste the morning of surgery.  Please DO NOT use if you have an allergy to CHG or antibacterial soaps.  If your skin becomes reddened/irritated stop using the CHG and inform your nurse when you arrive at Short Stay.  Do not shave (including legs and underarms) for at least 48 hours prior to the first CHG shower.  You may shave your face.  Please follow these instructions carefully:   1.  Shower with CHG Soap the night before surgery and the morning of Surgery.  2.  If you choose to wash your hair, wash your hair first as usual with your normal shampoo.  3.  After you shampoo, rinse your hair and body thoroughly to remove the shampoo. 4.  Use CHG as you would any other liquid soap.  You can apply chg directly to the skin and wash gently with a      scrungie or washcloth.           5.  Apply the CHG Soap to your body ONLY FROM THE NECK DOWN.   Do not use on open wounds or open sores. Avoid contact with your eyes, ears, mouth and genitals (private parts).  Wash genitals (private parts) with your normal soap -  do this prior to using CHG soap.  6.  Wash thoroughly, paying special attention to the area where your surgery will be performed.  7.  Thoroughly rinse your body with warm water from the neck down.  8.  DO NOT shower/wash with your normal soap after using and rinsing off the CHG Soap.  9.  Pat yourself dry with a clean towel.            10.  Wear clean pajamas.            11.  Place clean sheets on your bed the night of your first shower and do not sleep with pets.  Day of Surgery  Shower as above. Do not apply any lotions/deodorants the morning of surgery.   Please wear clean clothes to the hospital. Remember to brush your teeth with toothpaste.  Please read over  the fact sheets that you were given.

## 2020-08-17 NOTE — Progress Notes (Signed)
PCP - Dr. Lynnea Ferrier Cardiologist - Dr. Eden Emms (LOV - 10/31/19)  EKG - 10/31/19 Stress Test - 09/17/18 ECHO - 09/10/18  Aspirin Instructions: Last dose 08/11/20  ERAS Protcol - yes, with Pre-surgery Ensure   COVID TEST- scheduled for today   Anesthesia review: no  Patient denies shortness of breath, fever, cough and chest pain at PAT appointment   All instructions explained to the patient, with a verbal understanding of the material. Patient agrees to go over the instructions while at home for a better understanding. Patient also instructed to self quarantine after being tested for COVID-19. The opportunity to ask questions was provided.

## 2020-08-19 HISTORY — PX: BACK SURGERY: SHX140

## 2020-08-20 ENCOUNTER — Ambulatory Visit (HOSPITAL_COMMUNITY): Payer: BC Managed Care – PPO

## 2020-08-20 ENCOUNTER — Encounter: Payer: Self-pay | Admitting: Specialist

## 2020-08-20 ENCOUNTER — Ambulatory Visit (HOSPITAL_COMMUNITY): Payer: BC Managed Care – PPO | Admitting: Certified Registered Nurse Anesthetist

## 2020-08-20 ENCOUNTER — Encounter (HOSPITAL_COMMUNITY)
Admission: RE | Disposition: A | Discharge status: Home / Self Care | Payer: Self-pay | Source: Home / Self Care | Attending: Specialist

## 2020-08-20 ENCOUNTER — Encounter (HOSPITAL_COMMUNITY): Payer: Self-pay | Admitting: Specialist

## 2020-08-20 ENCOUNTER — Observation Stay (HOSPITAL_COMMUNITY)
Admission: RE | Admit: 2020-08-20 | Discharge: 2020-08-21 | Disposition: A | Discharge status: Home / Self Care | Payer: BC Managed Care – PPO | Attending: Specialist | Admitting: Specialist

## 2020-08-20 ENCOUNTER — Other Ambulatory Visit: Payer: Self-pay

## 2020-08-20 DIAGNOSIS — M48061 Spinal stenosis, lumbar region without neurogenic claudication: Secondary | ICD-10-CM | POA: Diagnosis not present

## 2020-08-20 DIAGNOSIS — M5416 Radiculopathy, lumbar region: Secondary | ICD-10-CM | POA: Insufficient documentation

## 2020-08-20 DIAGNOSIS — Z7982 Long term (current) use of aspirin: Secondary | ICD-10-CM | POA: Insufficient documentation

## 2020-08-20 DIAGNOSIS — I10 Essential (primary) hypertension: Secondary | ICD-10-CM | POA: Diagnosis not present

## 2020-08-20 DIAGNOSIS — M4325 Fusion of spine, thoracolumbar region: Secondary | ICD-10-CM

## 2020-08-20 DIAGNOSIS — Z419 Encounter for procedure for purposes other than remedying health state, unspecified: Secondary | ICD-10-CM

## 2020-08-20 DIAGNOSIS — M48062 Spinal stenosis, lumbar region with neurogenic claudication: Secondary | ICD-10-CM

## 2020-08-20 DIAGNOSIS — Z981 Arthrodesis status: Secondary | ICD-10-CM | POA: Diagnosis not present

## 2020-08-20 DIAGNOSIS — M4316 Spondylolisthesis, lumbar region: Principal | ICD-10-CM

## 2020-08-20 DIAGNOSIS — K219 Gastro-esophageal reflux disease without esophagitis: Secondary | ICD-10-CM | POA: Diagnosis not present

## 2020-08-20 LAB — ABO/RH: ABO/RH(D): A POS

## 2020-08-20 SURGERY — POSTERIOR LUMBAR FUSION 1 LEVEL
Anesthesia: General

## 2020-08-20 MED ORDER — PANTOPRAZOLE SODIUM 40 MG PO TBEC
40.0000 mg | DELAYED_RELEASE_TABLET | Freq: Every day | ORAL | Status: DC
  Administered 2020-08-20: 40 mg via ORAL
  Filled 2020-08-20: qty 1

## 2020-08-20 MED ORDER — FLEET ENEMA 7-19 GM/118ML RE ENEM: 1.0000 | ENEMA | Freq: Once | RECTAL | Status: DC | PRN

## 2020-08-20 MED ORDER — ACETAMINOPHEN 10 MG/ML IV SOLN: 1000.0000 mg | Freq: Once | INTRAVENOUS | Status: DC | PRN

## 2020-08-20 MED ORDER — EPHEDRINE SULFATE-NACL 50-0.9 MG/10ML-% IV SOSY
PREFILLED_SYRINGE | INTRAVENOUS | Status: DC | PRN
  Administered 2020-08-20: 10 mg via INTRAVENOUS
  Administered 2020-08-20 (×2): 5 mg via INTRAVENOUS

## 2020-08-20 MED ORDER — CHLORHEXIDINE GLUCONATE 0.12 % MT SOLN
OROMUCOSAL | Status: AC
  Filled 2020-08-20: qty 15

## 2020-08-20 MED ORDER — DEXAMETHASONE SODIUM PHOSPHATE 10 MG/ML IJ SOLN
INTRAMUSCULAR | Status: AC
  Filled 2020-08-20: qty 1

## 2020-08-20 MED ORDER — THROMBIN 20000 UNITS EX SOLR
CUTANEOUS | Status: DC | PRN
  Administered 2020-08-20: 20 mL via TOPICAL

## 2020-08-20 MED ORDER — AMISULPRIDE (ANTIEMETIC) 5 MG/2ML IV SOLN: 10.0000 mg | Freq: Once | INTRAVENOUS | Status: DC | PRN

## 2020-08-20 MED ORDER — HYDROCODONE-ACETAMINOPHEN 7.5-325 MG PO TABS
2.0000 | ORAL_TABLET | ORAL | Status: DC | PRN
Start: 2020-08-20 — End: 2020-08-21

## 2020-08-20 MED ORDER — ONDANSETRON HCL 4 MG/2ML IJ SOLN
4.0000 mg | Freq: Four times a day (QID) | INTRAMUSCULAR | Status: DC | PRN
  Administered 2020-08-20: 4 mg via INTRAVENOUS
  Filled 2020-08-20: qty 2

## 2020-08-20 MED ORDER — ACETAMINOPHEN 650 MG RE SUPP: 650.0000 mg | RECTAL | Status: DC | PRN

## 2020-08-20 MED ORDER — SUGAMMADEX SODIUM 200 MG/2ML IV SOLN
INTRAVENOUS | Status: DC | PRN
  Administered 2020-08-20: 200 mg via INTRAVENOUS

## 2020-08-20 MED ORDER — THROMBIN 5000 UNITS EX SOLR
CUTANEOUS | Status: DC | PRN
  Administered 2020-08-20: 5000 [IU] via TOPICAL

## 2020-08-20 MED ORDER — MULTIVITAMINS PO CAPS: 1.0000 | ORAL_CAPSULE | Freq: Every day | ORAL | Status: DC

## 2020-08-20 MED ORDER — SODIUM CHLORIDE 0.9 % IV SOLN: INTRAVENOUS | Status: DC

## 2020-08-20 MED ORDER — LACTATED RINGERS IV SOLN: INTRAVENOUS | Status: DC | PRN

## 2020-08-20 MED ORDER — ONDANSETRON HCL 4 MG PO TABS: 4.0000 mg | ORAL_TABLET | Freq: Four times a day (QID) | ORAL | Status: DC | PRN

## 2020-08-20 MED ORDER — ACETAMINOPHEN 500 MG PO TABS
ORAL_TABLET | ORAL | Status: AC
  Filled 2020-08-20: qty 2

## 2020-08-20 MED ORDER — SODIUM CHLORIDE 0.9 % IV SOLN: 250.0000 mL | INTRAVENOUS | Status: DC

## 2020-08-20 MED ORDER — HYDROMORPHONE HCL 1 MG/ML IJ SOLN
INTRAMUSCULAR | Status: AC
  Filled 2020-08-20: qty 1

## 2020-08-20 MED ORDER — HEMOSTATIC AGENTS (NO CHARGE) OPTIME
TOPICAL | Status: DC | PRN
  Administered 2020-08-20: 1 via TOPICAL

## 2020-08-20 MED ORDER — MORPHINE SULFATE (PF) 2 MG/ML IV SOLN: 1.0000 mg | INTRAVENOUS | Status: DC | PRN

## 2020-08-20 MED ORDER — 0.9 % SODIUM CHLORIDE (POUR BTL) OPTIME
TOPICAL | Status: DC | PRN
  Administered 2020-08-20 (×2): 1000 mL

## 2020-08-20 MED ORDER — PHENYLEPHRINE HCL-NACL 10-0.9 MG/250ML-% IV SOLN
INTRAVENOUS | Status: DC | PRN
  Administered 2020-08-20: 40 ug/min via INTRAVENOUS

## 2020-08-20 MED ORDER — HYDROCODONE-ACETAMINOPHEN 7.5-325 MG PO TABS
1.0000 | ORAL_TABLET | Freq: Four times a day (QID) | ORAL | Status: DC
  Administered 2020-08-20 – 2020-08-21 (×3): 1 via ORAL
  Filled 2020-08-20 (×3): qty 1

## 2020-08-20 MED ORDER — ATORVASTATIN CALCIUM 40 MG PO TABS: 40.0000 mg | ORAL_TABLET | Freq: Every day | ORAL | Status: DC

## 2020-08-20 MED ORDER — OXYCODONE HCL 5 MG/5ML PO SOLN: 5.0000 mg | Freq: Once | ORAL | Status: DC | PRN

## 2020-08-20 MED ORDER — CEFAZOLIN SODIUM-DEXTROSE 2-4 GM/100ML-% IV SOLN
2.0000 g | Freq: Three times a day (TID) | INTRAVENOUS | Status: AC
Start: 2020-08-20 — End: 2020-08-20
  Administered 2020-08-20 (×2): 2 g via INTRAVENOUS
  Filled 2020-08-20 (×2): qty 100

## 2020-08-20 MED ORDER — ACETAMINOPHEN 325 MG PO TABS
ORAL_TABLET | ORAL | Status: DC | PRN
  Administered 2020-08-20: 1000 mg via ORAL

## 2020-08-20 MED ORDER — METHOCARBAMOL 1000 MG/10ML IJ SOLN
500.0000 mg | Freq: Four times a day (QID) | INTRAVENOUS | Status: DC | PRN
  Filled 2020-08-20: qty 5

## 2020-08-20 MED ORDER — ROCURONIUM BROMIDE 10 MG/ML (PF) SYRINGE
PREFILLED_SYRINGE | INTRAVENOUS | Status: DC | PRN
  Administered 2020-08-20: 30 mg via INTRAVENOUS
  Administered 2020-08-20 (×2): 50 mg via INTRAVENOUS
  Administered 2020-08-20: 20 mg via INTRAVENOUS
  Administered 2020-08-20: 30 mg via INTRAVENOUS

## 2020-08-20 MED ORDER — FENTANYL CITRATE (PF) 250 MCG/5ML IJ SOLN
INTRAMUSCULAR | Status: AC
  Filled 2020-08-20: qty 5

## 2020-08-20 MED ORDER — SODIUM CHLORIDE 0.9% FLUSH
3.0000 mL | Freq: Two times a day (BID) | INTRAVENOUS | Status: DC
  Administered 2020-08-20 (×2): 3 mL via INTRAVENOUS

## 2020-08-20 MED ORDER — BISACODYL 5 MG PO TBEC
5.0000 mg | DELAYED_RELEASE_TABLET | Freq: Every day | ORAL | Status: DC | PRN
Start: 2020-08-20 — End: 2020-08-21

## 2020-08-20 MED ORDER — FENTANYL CITRATE (PF) 250 MCG/5ML IJ SOLN
INTRAMUSCULAR | Status: DC | PRN
  Administered 2020-08-20 (×2): 25 ug via INTRAVENOUS
  Administered 2020-08-20: 100 ug via INTRAVENOUS
  Administered 2020-08-20: 25 ug via INTRAVENOUS

## 2020-08-20 MED ORDER — BUPIVACAINE HCL (PF) 0.5 % IJ SOLN
INTRAMUSCULAR | Status: AC
  Filled 2020-08-20: qty 30

## 2020-08-20 MED ORDER — ACETAMINOPHEN 325 MG PO TABS: 650.0000 mg | ORAL_TABLET | ORAL | Status: DC | PRN

## 2020-08-20 MED ORDER — MEPERIDINE HCL 25 MG/ML IJ SOLN: 6.2500 mg | INTRAMUSCULAR | Status: DC | PRN

## 2020-08-20 MED ORDER — VITAMIN D 25 MCG (1000 UNIT) PO TABS
5000.0000 [IU] | ORAL_TABLET | ORAL | Status: DC
  Filled 2020-08-20: qty 5

## 2020-08-20 MED ORDER — THROMBIN 20000 UNITS EX SOLR
CUTANEOUS | Status: AC
  Filled 2020-08-20: qty 20000

## 2020-08-20 MED ORDER — CEFAZOLIN SODIUM-DEXTROSE 2-4 GM/100ML-% IV SOLN
2.0000 g | INTRAVENOUS | Status: AC
  Administered 2020-08-20: 2 g via INTRAVENOUS
  Filled 2020-08-20: qty 100

## 2020-08-20 MED ORDER — DEXAMETHASONE SODIUM PHOSPHATE 10 MG/ML IJ SOLN
INTRAMUSCULAR | Status: DC | PRN
  Administered 2020-08-20: 10 mg via INTRAVENOUS

## 2020-08-20 MED ORDER — ASPIRIN EC 81 MG PO TBEC
81.0000 mg | DELAYED_RELEASE_TABLET | Freq: Every day | ORAL | Status: DC
  Administered 2020-08-20: 81 mg via ORAL
  Filled 2020-08-20: qty 1

## 2020-08-20 MED ORDER — METHOCARBAMOL 500 MG PO TABS
ORAL_TABLET | ORAL | Status: AC
  Filled 2020-08-20: qty 1

## 2020-08-20 MED ORDER — PROPOFOL 10 MG/ML IV BOLUS
INTRAVENOUS | Status: DC | PRN
  Administered 2020-08-20: 150 mg via INTRAVENOUS

## 2020-08-20 MED ORDER — METHOCARBAMOL 500 MG PO TABS
500.0000 mg | ORAL_TABLET | Freq: Four times a day (QID) | ORAL | Status: DC | PRN
  Administered 2020-08-20 (×2): 500 mg via ORAL
  Filled 2020-08-20 (×2): qty 1

## 2020-08-20 MED ORDER — SODIUM CHLORIDE 0.9% FLUSH: 3.0000 mL | INTRAVENOUS | Status: DC | PRN

## 2020-08-20 MED ORDER — ONDANSETRON HCL 4 MG PO TABS
4.0000 mg | ORAL_TABLET | ORAL | Status: DC | PRN
  Administered 2020-08-21 (×2): 4 mg via ORAL
  Filled 2020-08-20: qty 1

## 2020-08-20 MED ORDER — HYDROMORPHONE HCL 1 MG/ML IJ SOLN
0.2500 mg | INTRAMUSCULAR | Status: DC | PRN
  Administered 2020-08-20 (×4): 0.5 mg via INTRAVENOUS

## 2020-08-20 MED ORDER — ONDANSETRON HCL 4 MG/2ML IJ SOLN: 4.0000 mg | Freq: Once | INTRAMUSCULAR | Status: DC | PRN

## 2020-08-20 MED ORDER — LIDOCAINE 2% (20 MG/ML) 5 ML SYRINGE
INTRAMUSCULAR | Status: AC
  Filled 2020-08-20: qty 5

## 2020-08-20 MED ORDER — HYDROCODONE-ACETAMINOPHEN 7.5-325 MG PO TABS: 1.0000 | ORAL_TABLET | ORAL | Status: DC | PRN

## 2020-08-20 MED ORDER — ROCURONIUM BROMIDE 10 MG/ML (PF) SYRINGE
PREFILLED_SYRINGE | INTRAVENOUS | Status: AC
  Filled 2020-08-20: qty 10

## 2020-08-20 MED ORDER — ONDANSETRON HCL 4 MG/2ML IJ SOLN
4.0000 mg | INTRAMUSCULAR | Status: DC | PRN
  Administered 2020-08-20: 4 mg via INTRAVENOUS
  Filled 2020-08-20: qty 2

## 2020-08-20 MED ORDER — GABAPENTIN 300 MG PO CAPS
300.0000 mg | ORAL_CAPSULE | Freq: Three times a day (TID) | ORAL | Status: DC
  Administered 2020-08-20 (×2): 300 mg via ORAL
  Filled 2020-08-20 (×2): qty 1

## 2020-08-20 MED ORDER — ONDANSETRON HCL 4 MG/2ML IJ SOLN
INTRAMUSCULAR | Status: DC | PRN
  Administered 2020-08-20: 4 mg via INTRAVENOUS

## 2020-08-20 MED ORDER — PHENYLEPHRINE HCL (PRESSORS) 10 MG/ML IV SOLN
INTRAVENOUS | Status: AC
  Filled 2020-08-20: qty 1

## 2020-08-20 MED ORDER — PHENOL 1.4 % MT LIQD: 1.0000 | OROMUCOSAL | Status: DC | PRN

## 2020-08-20 MED ORDER — THROMBIN 5000 UNITS EX SOLR
CUTANEOUS | Status: AC
  Filled 2020-08-20: qty 5000

## 2020-08-20 MED ORDER — ADULT MULTIVITAMIN W/MINERALS CH
1.0000 | ORAL_TABLET | Freq: Every day | ORAL | Status: DC
  Filled 2020-08-20: qty 1

## 2020-08-20 MED ORDER — OXYCODONE HCL 5 MG PO TABS: 5.0000 mg | ORAL_TABLET | Freq: Once | ORAL | Status: DC | PRN

## 2020-08-20 MED ORDER — PANTOPRAZOLE SODIUM 40 MG IV SOLR: 40.0000 mg | Freq: Every day | INTRAVENOUS | Status: DC

## 2020-08-20 MED ORDER — ONDANSETRON HCL 4 MG/2ML IJ SOLN: INTRAMUSCULAR | Status: DC | PRN

## 2020-08-20 MED ORDER — ALUM & MAG HYDROXIDE-SIMETH 200-200-20 MG/5ML PO SUSP: 30.0000 mL | Freq: Four times a day (QID) | ORAL | Status: DC | PRN

## 2020-08-20 MED ORDER — POLYETHYLENE GLYCOL 3350 17 G PO PACK
17.0000 g | PACK | Freq: Every day | ORAL | Status: DC | PRN
Start: 2020-08-20 — End: 2020-08-21

## 2020-08-20 MED ORDER — MENTHOL 3 MG MT LOZG: 1.0000 | LOZENGE | OROMUCOSAL | Status: DC | PRN

## 2020-08-20 MED ORDER — DOCUSATE SODIUM 100 MG PO CAPS
100.0000 mg | ORAL_CAPSULE | Freq: Two times a day (BID) | ORAL | Status: DC
  Administered 2020-08-20 (×2): 100 mg via ORAL
  Filled 2020-08-20 (×2): qty 1

## 2020-08-20 MED ORDER — ONDANSETRON HCL 4 MG/2ML IJ SOLN
INTRAMUSCULAR | Status: AC
  Filled 2020-08-20: qty 2

## 2020-08-20 MED ORDER — PROPOFOL 10 MG/ML IV BOLUS
INTRAVENOUS | Status: AC
  Filled 2020-08-20: qty 20

## 2020-08-20 MED ORDER — BUPIVACAINE HCL 0.5 % IJ SOLN
INTRAMUSCULAR | Status: DC | PRN
  Administered 2020-08-20: 34 mL

## 2020-08-20 MED ORDER — LIDOCAINE 2% (20 MG/ML) 5 ML SYRINGE
INTRAMUSCULAR | Status: DC | PRN
  Administered 2020-08-20: 100 mg via INTRAVENOUS

## 2020-08-20 MED ORDER — CALCIUM CARBONATE ANTACID 500 MG PO CHEW: 500.0000 mg | CHEWABLE_TABLET | Freq: Every day | ORAL | Status: DC | PRN

## 2020-08-20 SURGICAL SUPPLY — 75 items
BLADE CLIPPER SURG (BLADE) ×2 IMPLANT
BONE CANC CHIPS 20CC PCAN1/4 (Bone Implant) ×2 IMPLANT
BONE VIVIGEN FORMABLE 5.4CC (Bone Implant) ×2 IMPLANT
BUR MATCHSTICK NEURO 3.0 LAGG (BURR) ×2 IMPLANT
BUR SABER RD CUTTING 3.0 (BURR) IMPLANT
CAGE LORD XPAC 10X28 (Cage) ×2 IMPLANT
CHIPS CANC BONE 20CC PCAN1/4 (Bone Implant) ×1 IMPLANT
COVER BACK TABLE 80X110 HD (DRAPES) ×2 IMPLANT
COVER SURGICAL LIGHT HANDLE (MISCELLANEOUS) ×2 IMPLANT
COVER WAND RF STERILE (DRAPES) ×2 IMPLANT
DECANTER SPIKE VIAL GLASS SM (MISCELLANEOUS) ×2 IMPLANT
DERMABOND ADVANCED (GAUZE/BANDAGES/DRESSINGS) ×1
DERMABOND ADVANCED .7 DNX12 (GAUZE/BANDAGES/DRESSINGS) ×1 IMPLANT
DRAPE C-ARM 42X72 X-RAY (DRAPES) ×2 IMPLANT
DRAPE C-ARMOR (DRAPES) ×2 IMPLANT
DRAPE MICROSCOPE LEICA (MISCELLANEOUS) ×2 IMPLANT
DRAPE POUCH INSTRU U-SHP 10X18 (DRAPES) ×2 IMPLANT
DRAPE SURG 17X23 STRL (DRAPES) ×8 IMPLANT
DRSG MEPILEX BORDER 4X4 (GAUZE/BANDAGES/DRESSINGS) IMPLANT
DRSG MEPILEX BORDER 4X8 (GAUZE/BANDAGES/DRESSINGS) IMPLANT
DRSG XEROFORM 1X8 (GAUZE/BANDAGES/DRESSINGS) ×4 IMPLANT
DURAPREP 26ML APPLICATOR (WOUND CARE) ×2 IMPLANT
ELECT BLADE 4.0 EZ CLEAN MEGAD (MISCELLANEOUS) ×2
ELECT BLADE 6.5 EXT (BLADE) ×2 IMPLANT
ELECT CAUTERY BLADE 6.4 (BLADE) ×2 IMPLANT
ELECT REM PT RETURN 9FT ADLT (ELECTROSURGICAL) ×2
ELECTRODE BLDE 4.0 EZ CLN MEGD (MISCELLANEOUS) ×1 IMPLANT
ELECTRODE REM PT RTRN 9FT ADLT (ELECTROSURGICAL) ×1 IMPLANT
EVACUATOR 1/8 PVC DRAIN (DRAIN) IMPLANT
GAUZE SPONGE 4X4 12PLY STRL (GAUZE/BANDAGES/DRESSINGS) ×2 IMPLANT
GLOVE BIOGEL PI IND STRL 8 (GLOVE) ×1 IMPLANT
GLOVE BIOGEL PI INDICATOR 8 (GLOVE) ×1
GLOVE ECLIPSE 9.0 STRL (GLOVE) ×2 IMPLANT
GLOVE ORTHO TXT STRL SZ7.5 (GLOVE) ×2 IMPLANT
GLOVE SURG 8.5 LATEX PF (GLOVE) ×2 IMPLANT
GOWN STRL REUS W/ TWL LRG LVL3 (GOWN DISPOSABLE) ×1 IMPLANT
GOWN STRL REUS W/TWL 2XL LVL3 (GOWN DISPOSABLE) ×4 IMPLANT
GOWN STRL REUS W/TWL LRG LVL3 (GOWN DISPOSABLE) ×1
KIT BASIN OR (CUSTOM PROCEDURE TRAY) ×2 IMPLANT
KIT POSITION SURG JACKSON T1 (MISCELLANEOUS) ×2 IMPLANT
KIT TURNOVER KIT B (KITS) ×2 IMPLANT
NEEDLE 22X1 1/2 (OR ONLY) (NEEDLE) ×2 IMPLANT
NEEDLE SPNL 18GX3.5 QUINCKE PK (NEEDLE) IMPLANT
NS IRRIG 1000ML POUR BTL (IV SOLUTION) ×4 IMPLANT
PACK LAMINECTOMY ORTHO (CUSTOM PROCEDURE TRAY) ×2 IMPLANT
PAD ABD 8X10 STRL (GAUZE/BANDAGES/DRESSINGS) ×2 IMPLANT
PAD ARMBOARD 7.5X6 YLW CONV (MISCELLANEOUS) IMPLANT
PATTIES SURGICAL .75X.75 (GAUZE/BANDAGES/DRESSINGS) ×2 IMPLANT
PATTIES SURGICAL 1X1 (DISPOSABLE) ×2 IMPLANT
ROD PRE BENT EXP 40MM (Rod) ×4 IMPLANT
SCREW SET SINGLE INNER (Screw) ×8 IMPLANT
SCREW VIPER 7X50MM (Screw) ×8 IMPLANT
SPOGE SURGIFLO 8M (HEMOSTASIS) ×1
SPONGE LAP 4X18 RFD (DISPOSABLE) ×2 IMPLANT
SPONGE SURGIFLO 8M (HEMOSTASIS) ×1 IMPLANT
SPONGE SURGIFOAM ABS GEL 100 (HEMOSTASIS) ×2 IMPLANT
STAPLER VISISTAT 35W (STAPLE) ×2 IMPLANT
SUT VIC AB 0 CT1 27 (SUTURE) ×1
SUT VIC AB 0 CT1 27XBRD ANBCTR (SUTURE) ×1 IMPLANT
SUT VIC AB 1 CTX 36 (SUTURE) ×2
SUT VIC AB 1 CTX36XBRD ANBCTR (SUTURE) ×2 IMPLANT
SUT VIC AB 2-0 CT1 27 (SUTURE) ×1
SUT VIC AB 2-0 CT1 TAPERPNT 27 (SUTURE) ×1 IMPLANT
SUT VIC AB 3-0 X1 27 (SUTURE) ×2 IMPLANT
SYR 20ML LL LF (SYRINGE) ×2 IMPLANT
SYR CONTROL 10ML LL (SYRINGE) ×4 IMPLANT
TAP CANN VIPER2 DL 5.0 (TAP) ×2 IMPLANT
TAP CANN VIPER2 DL 6.0 (TAP) ×2 IMPLANT
TAP VIPER MIS 4.35MM (TAP) ×2 IMPLANT
TAPE CLOTH SURG 6X10 WHT LF (GAUZE/BANDAGES/DRESSINGS) ×2 IMPLANT
TOWEL GREEN STERILE (TOWEL DISPOSABLE) ×2 IMPLANT
TOWEL GREEN STERILE FF (TOWEL DISPOSABLE) ×2 IMPLANT
TRAY FOLEY MTR SLVR 16FR STAT (SET/KITS/TRAYS/PACK) ×2 IMPLANT
WATER STERILE IRR 1000ML POUR (IV SOLUTION) ×2 IMPLANT
YANKAUER SUCT BULB TIP NO VENT (SUCTIONS) ×2 IMPLANT

## 2020-08-20 NOTE — Brief Op Note (Signed)
08/20/2020  11:30 AM  PATIENT:  Patrick Wall  69 y.o. male  PRE-OPERATIVE DIAGNOSIS:  Lumbar four-five spondylolisthesis with spinal stenosis  POST-OPERATIVE DIAGNOSIS:  Lumbar four-five spondylolisthesis with spinal stenosis  PROCEDURE:  Procedure(s): TRANSFORAMINAL LUMBAR INTERBODY FUSION LEFT LUMBAR FOUR-FIVE WITH DEPUY MPACT SCREWS RODS AND ADJUSTABLE HEIGHT CAGE, LOCAL BONE GRAFT, ALLOGRAFT BONE GRAFT, VIVIGEN (N/A)  SURGEON:  Surgeon(s) and Role:    * Kerrin Champagne, MD - Primary  PHYSICIAN ASSISTANT: Zonia Kief, PA-C  ANESTHESIA:   local and general, Druscilla Brownie, MD  EBL:  300 mL   BLOOD ADMINISTERED:50cc Cell saver blood  DRAINS: Urinary Catheter (Foley)   LOCAL MEDICATIONS USED:  MARCAINE 0.5% 1:1 exparel 1.3% Amount: 30 ml  SPECIMEN:  No Specimen  DISPOSITION OF SPECIMEN:  N/A  COUNTS:  YES  TOURNIQUET:  * No tourniquets in log *  DICTATION: .Dragon Dictation  PLAN OF CARE: Admit for overnight observation  PATIENT DISPOSITION:  PACU - hemodynamically stable.   Delay start of Pharmacological VTE agent (>24hrs) due to surgical blood loss or risk of bleeding: yes

## 2020-08-20 NOTE — H&P (Signed)
Patrick Wall is an 69 y.o. male.   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  He continues have ongoing low back pain and bilateral lower extremity radiculopathy.  Symptoms unchanged in previous visit.  He is wanting to proceed with TRANSFORAMINAL LUMBAR INTERBODY FUSION LEFT L4-5 WITH DEPUY MPACT SCREWS RODS AND ADJUSTABLE HEIGHT CAGE, LOCAL BONE GRAFT, ALLOGRAFT BONE GRAFT, VIVIGEN.  Today history and physical performed.  Past Medical History:  Diagnosis Date  . GERD (gastroesophageal reflux disease)    tums  . Hearing loss   . History of kidney stones   . Hyperlipidemia   . Labral tear of shoulder   . Lumbar spinal stenosis     Past Surgical History:  Procedure Laterality Date  . COLONOSCOPY    . EYE SURGERY  2000   lasic  . MOUTH SURGERY     teeth removed, dental implants placed  . TONSILLECTOMY    . VASECTOMY  1983    Family History  Problem Relation Age of Onset  . Cancer Mother        skin  . Heart disease Mother   . Vision loss Mother   . Early death Father   . Arthritis Maternal Grandmother   . Cancer Maternal Grandmother   . Hyperlipidemia Maternal Grandmother   . Hypertension Maternal Grandmother   . Cancer Maternal Grandfather   . Heart disease Paternal Grandfather   . Stroke Paternal Grandfather    Social History:  reports that he has never smoked. He has never used smokeless tobacco. He reports current alcohol use of about 9.0 standard drinks of alcohol per week. He reports that he does not use drugs.  Allergies:  Allergies  Allergen Reactions  . Sulfa Antibiotics     Unknown reaction    Medications Prior to Admission  Medication Sig Dispense Refill  . atorvastatin (LIPITOR) 40 MG tablet Take 1 tablet (40 mg total) by mouth daily. 90 tablet 3  . calcium carbonate (TUMS - DOSED IN MG ELEMENTAL CALCIUM) 500 MG chewable tablet Chew 500 mg by  mouth daily as needed for indigestion or heartburn.    . Cholecalciferol (VITAMIN D-3) 125 MCG (5000 UT) TABS Take 5,000 Units by mouth 3 (three) times a week.    . Multiple Vitamin (MULTIVITAMIN) capsule Take 1 capsule by mouth daily.    Marland Kitchen aspirin EC 81 MG tablet Take 81 mg by mouth daily.      No results found for this or any previous visit (from the past 48 hour(s)). No results found.  Review of Systems  Constitutional: Positive for activity change.  HENT: Negative.   Respiratory: Negative.   Cardiovascular: Negative.   Gastrointestinal: Negative.   Genitourinary: Negative.   Musculoskeletal: Positive for back pain.  Neurological: Positive for numbness.  Psychiatric/Behavioral: Negative.     Blood pressure 139/81, pulse (!) 47, temperature 97.7 F (36.5 C), temperature source Oral, resp. rate 18, height 6\' 3"  (1.905 m), weight 91.9 kg, SpO2 98 %. Physical Exam Constitutional:      Appearance: Normal appearance.  HENT:     Head: Normocephalic and atraumatic.     Nose: Nose normal.  Eyes:     Extraocular Movements: Extraocular movements intact.     Pupils: Pupils are equal, round, and reactive to light.  Cardiovascular:     Rate and Rhythm: Regular rhythm.     Heart sounds: Normal heart sounds.  Pulmonary:  Effort: Pulmonary effort is normal. No respiratory distress.     Breath sounds: Normal breath sounds.  Abdominal:     General: There is no distension.  Musculoskeletal:        General: Tenderness present.     Cervical back: Normal range of motion.  Neurological:     Mental Status: He is alert and oriented to person, place, and time.      Assessment/Plan L4-5 stenosis, low back and LE radiculopathy  We will proceed with surgery as scheduled. Surgical procedure discussed in detail and All questions answered.  Zonia Kief, PA-C 08/20/2020, 6:32 AM

## 2020-08-20 NOTE — Anesthesia Postprocedure Evaluation (Signed)
Anesthesia Post Note  Patient: Patrick Wall  Procedure(s) Performed: TRANSFORAMINAL LUMBAR INTERBODY FUSION LEFT LUMBAR FOUR-FIVE WITH DEPUY MPACT SCREWS RODS AND ADJUSTABLE HEIGHT CAGE, LOCAL BONE GRAFT, ALLOGRAFT BONE GRAFT, VIVIGEN (N/A )     Patient location during evaluation: PACU Anesthesia Type: General Level of consciousness: awake and alert Pain management: pain level controlled Vital Signs Assessment: post-procedure vital signs reviewed and stable Respiratory status: spontaneous breathing, nonlabored ventilation, respiratory function stable and patient connected to nasal cannula oxygen Cardiovascular status: blood pressure returned to baseline and stable Postop Assessment: no apparent nausea or vomiting Anesthetic complications: no   No complications documented.  Last Vitals:  Vitals:   08/20/20 1253 08/20/20 1523  BP: 122/80 111/69  Pulse: (!) 57 61  Resp: 18 16  Temp: 36.7 C 36.7 C  SpO2: 97% 100%    Last Pain:  Vitals:   08/20/20 1523  TempSrc: Oral  PainSc:                  [name]

## 2020-08-20 NOTE — Transfer of Care (Signed)
Immediate Anesthesia Transfer of Care Note  Patient: Tramell Piechota  Procedure(s) Performed: TRANSFORAMINAL LUMBAR INTERBODY FUSION LEFT LUMBAR FOUR-FIVE WITH DEPUY MPACT SCREWS RODS AND ADJUSTABLE HEIGHT CAGE, LOCAL BONE GRAFT, ALLOGRAFT BONE GRAFT, VIVIGEN (N/A )  Patient Location: PACU  Anesthesia Type:General  Level of Consciousness: awake  Airway & Oxygen Therapy: Patient Spontanous Breathing  Post-op Assessment: Report given to RN and Post -op Vital signs reviewed and stable  Post vital signs: Reviewed and stable  Last Vitals:  Vitals Value Taken Time  BP    Temp    Pulse    Resp    SpO2      Last Pain:  Vitals:   08/20/20 0642  TempSrc:   PainSc: 0-No pain         Complications: No complications documented.

## 2020-08-20 NOTE — Anesthesia Preprocedure Evaluation (Signed)
Anesthesia Evaluation  Patient identified by MRN, date of birth, ID band Patient awake    Reviewed: Allergy & Precautions, NPO status , Patient's Chart, lab work & pertinent test results  Airway Mallampati: II  TM Distance: >3 FB Neck ROM: Full    Dental no notable dental hx. (+) Teeth Intact, Dental Advisory Given   Pulmonary neg pulmonary ROS,    Pulmonary exam normal breath sounds clear to auscultation       Cardiovascular Exercise Tolerance: Good hypertension, Pt. on medications Normal cardiovascular exam Rhythm:Regular Rate:Normal  Myoview 09/2018   Nuclear stress EF: 59%. The left ventricular ejection fraction is normal (55-65%).  There was no ST segment deviation noted during stress.  This is a low risk study. no evidence of ischemia or previous infarction  The study is normal.     Neuro/Psych negative neurological ROS  negative psych ROS   GI/Hepatic Neg liver ROS, GERD  Controlled,  Endo/Other  negative endocrine ROS  Renal/GU negative Renal ROSCr 1.06     Musculoskeletal negative musculoskeletal ROS (+)   Abdominal   Peds  Hematology Hgb 14.7   Anesthesia Other Findings   Reproductive/Obstetrics                            Anesthesia Physical Anesthesia Plan  ASA: II  Anesthesia Plan: General   Post-op Pain Management:    Induction: Intravenous  PONV Risk Score and Plan: Ondansetron, Dexamethasone, Treatment may vary due to age or medical condition and Midazolam  Airway Management Planned: Oral ETT  Additional Equipment: None  Intra-op Plan:   Post-operative Plan: Extubation in OR  Informed Consent: I have reviewed the patients History and Physical, chart, labs and discussed the procedure including the risks, benefits and alternatives for the proposed anesthesia with the patient or authorized representative who has indicated his/her understanding and acceptance.      Dental advisory given  Plan Discussed with: CRNA and Anesthesiologist  Anesthesia Plan Comments:         Anesthesia Quick Evaluation

## 2020-08-20 NOTE — Anesthesia Procedure Notes (Signed)
Procedure Name: Intubation Performed by: Terriann Difonzo H, CRNA Pre-anesthesia Checklist: Patient identified, Emergency Drugs available, Suction available and Patient being monitored Patient Re-evaluated:Patient Re-evaluated prior to induction Oxygen Delivery Method: Circle System Utilized Preoxygenation: Pre-oxygenation with 100% oxygen Induction Type: IV induction Ventilation: Mask ventilation without difficulty Laryngoscope Size: Miller and 2 Grade View: Grade I Tube type: Oral Tube size: 7.5 mm Number of attempts: 1 Airway Equipment and Method: Stylet and Oral airway Placement Confirmation: ETT inserted through vocal cords under direct vision,  positive ETCO2 and breath sounds checked- equal and bilateral Secured at: 23 cm Tube secured with: Tape Dental Injury: Teeth and Oropharynx as per pre-operative assessment        

## 2020-08-20 NOTE — Op Note (Addendum)
08/20/2020  11:34 AM  PATIENT:  Patrick Wall  69 y.o. male  MRN: [id number]  OPERATIVE REPORT  PRE-OPERATIVE DIAGNOSIS:  Lumbar four-five spondylolisthesis with spinal stenosis  POST-OPERATIVE DIAGNOSIS:  Lumbar four-five spondylolisthesis with spinal stenosis  PROCEDURE:  Procedure(s): TRANSFORAMINAL LUMBAR INTERBODY FUSION LEFT LUMBAR FOUR-FIVE WITH DEPUY MPACT SCREWS RODS AND ADJUSTABLE HEIGHT CAGE, LOCAL BONE GRAFT, ALLOGRAFT BONE GRAFT, VIVIGEN    SURGEON:  Jessy Oto, MD     ASSISTANT: Benjiman Core, PA-C  (Present throughout the entire procedure and necessary for completion of procedure in a timely manner)     ANESTHESIA:  General, supplemented with local marcaine 0.5% 1:1 exparel 1.3% total 20cc/ Dr. Valma Cava.    EBL: 150cc  DRAIN: Foley to SD.   COMPLICATIONS:  None.     COMPONENTS:   Implant Name Type Inv. Item Serial No. Manufacturer Lot No. LRB No. Used Action  BONE VIVIGEN FORMABLE 5.4CC - [id number] Bone Implant BONE VIVIGEN FORMABLE 5.4CC [id number] [employer]  N/A 1 Implanted  SCREW SET SINGLE INNER - [id number] Screw SCREW SET SINGLE INNER  [employer]  N/A 4 Implanted  BONE CANC CHIPS 20CC - [id number] Bone Implant BONE CANC CHIPS 20CC [id number] [employer]  N/A 1 Implanted  SCREW VIPER 7X50MM - [id number] Screw SCREW VIPER 7X50MM  [employer]  N/A 4 Implanted  CAGE LORD XPAC 10X28 - [id number] Cage CAGE LORD XPAC 10X28  [employer]  N/A 1 Implanted  ROD PRE BENT EXP 40MM - [id number] Rod ROD PRE BENT EXP 40MM  [employer]  N/A 2 Implanted    PROCEDURE:The patient was met in the holding area, and the appropriate lumbar level Left L4-5  identified and marked with an x and my initials.The patient was then transported to OR. The patient was then placed under general anesthesia without difficulty.The patient received appropriate preoperative antibiotic prophylaxis  ancef.  Nursing staff inserted a Foley catheter under sterile conditions. He was then turned to a prone position [place] spine table was used for this case. All pressure points were well padded PAS stocking applied bilateral lower extremity to prevent DVT. Standard prep DuraPrep solution. Draped in the usual manner. Time-out procedure was called and correct .   The incision was at L4-5 and determined using C-arm to mark the inferior L5 pedicles and  an additionally extended up to the L3 spinous process.   Bovie electric cautery was used to control bleeding and carefully dissection was carried down along the lateral aspects of the spinous process of L3 to L5. Cobb then used to carefully elevate the paralumbar muscle and the incision in the midline was carried to to the level of the base of the residual spinous processes. The posterior exposure area extending from the base of the spinous process of L3 to the superior aspect of L5 was carried expose at its edges debrided the muscle attachments using a Leskell. Time-out procedure was called and correct. Skin in the midline between L2 and L5 was then infiltrated with local anesthesia, marcaine 1/2% 1:1 exparel 1.3% total 20 cc used. Incision was then made  extending from L2-L5  through the skin and subcutaneous layers down to the patient's lumbodorsal fascia and spinous processes. The incision then carried sharply excising the supraspinous ligament and then continuing the lateral aspect of the spinous processes of L3,L4 and L5. Cobb elevator used to carefully elevate the paralumbar muscles off of the posterior elements using  electrocautery carefully drilled bleeding and perform dissection of the muscle tissues of the preserving the facet capsule at the L3-4. Continuing the exposure out laterally to expose the lateral margin of the facet joint line at L4-5 and L3-4. Incision was carried in the midline down to the L5 level area bleeders controlled using electrocautery  monopolar electrocautery.    C-arm fluoroscopy was then brought into the field and using C-arm fluoroscopy then a hole made into the medial aspect of the left pedicle of L4 using a high speed burr observed in the pedicle using C arm at the 5 oclock position on the left L4 pedicle nerve probe initial entry was determined on fluoroscopy to be good position alignment so that a 4.27m tap was passed to 50 mm within the left L4 pedicle to a depth of nearly 50 mm observed on C-arm fluoroscopy to be beyond the midpoint of the lumbar vertebra and then position alignment within the left L4 pedicle this was then removed and the pedicle channel probed demonstrating patency no sign of rupture the cortex of the pedicle. Tapping with a 4.35 mm screw tap then a 6 mm and  a 7.0 mm x 50 mm screw was reserved for later placement on the left side pedicle at the L4 level. C-arm fluoroscopy was then brought into the field and using C-arm fluoroscopy then a hole made into the posterior medial aspect of the pedicle of right L4 observed in the pedicle using ball tipped nerve hook and hockey stick nerve probe initial entry was determined on fluoroscopy to be good position alignment so that 4.35 mm tap was then used to tap the right L4 pedicle to a depth of nearly 50 mm observed on C-arm fluoroscopy to be beyond the midpoint of the lumbar vertebra and then position alignment within the right L4 pedicle this was then removed and the pedicle channel probed demonstrating patency no sign of rupture the cortex of the pedicle. Tapping with a 5 mm screw tap then tapping with a 6.0 mm and a 7.0 mm x 50 mm screw was placeed.C-arm fluoroscopy was then brought into the field and using C-arm fluoroscopy then a hole made into the posterior and medial aspect of the left pedicle of L5 observed in the pedicle using ball tipped nerve hook and hockey stick nerve probe initial entry was determined on fluoroscopy to be good position alignment so that a 4.35  mm tap was then used to tap the left L4 pedicle to a depth of nearly 45 mm observed on C-arm fluoroscopy to be beyond the posterior one third of the lumbar vertebra and good position alignment within the left L5 pedicle this was then removed and the pedicle channel probed demonstrating patency no sign of rupture the cortex of the pedicle. Tapping with a 4.35 mm screw tap then a 6.0 mm tap and a 7.044mx 50 mm screw was saved for later placement on the left side at the L5 level. The pedicle channel of L5 on the left probed demonstrating patency no sign of rupture the cortex of the pedicle.C-arm fluoroscopy was then brought into the field and using C-arm fluoroscopy then a hole made into the posterior and medial aspect of the right pedicle of L5 observed in the pedicle using ball tipped nerve hook and hockey stick nerve probe initial entry was determined on fluoroscopy to be good position alignment so that a 4.3558map was then used to tap the right L5 pedicle to a depth of nearly  50 mm observed on C-arm fluoroscopy to be beyond the posterior one third of the lumbar vertebra and good position alignment within the right L5 pedicle this was then removed and the pedicle channel probed demonstrating patency no sign of rupture the cortex of the pedicle. Tapping with a 4.32m screw tap then up to a 687mtap then 7.68m57m 50 mm screw was placed on the right side at the L5 level. The pedicle channel of L5 on the right probed demonstrating patency no sign of rupture the cortex of the pedicle. Viper screw for fixation of this level was measured as 7.0 mm x 50 mm screw inserted.  Flow Seal and thrombin soaked gel foam was used for hemostasis of the left L4 and L5 pedicle screw holes.   Spinous processes of  L4 inferior 50%and superior 20% of L5 were then resected down to the base the lamina at each segment.  Leksell rongeur used to resect inferior aspect of the lamina on the left side at the L4 level and partially on the right  side at L4 The left medial 40% of the facets of L4-5 were resected in order to decompress the left and right side of the lumbar thecal sac at L4-5 and decompress the bilateral  L4 and L5 neuroforamen. Osteotomes and 2mm7md 3mm 55mrisons were used for this portion of the decompression. Similarly the right side decompression was carried out but  Near complete facetectomy was perform on the left at L4-5 to provide for exposure of the left side L4-5 neuroforamen for ease of placement of TLIF (transforaminal lumbar interbody fusion) at the L4 level inferior portions of the lamina and pars were also resected first beginning with the Leksell rongeur and osteotomes and then resecting using 2 and 3 mm Kerrison. Continued laminectomy was carried out resecting the central portions of the lamina of L4 and upper L5 performing foraminotomies on the right side at the L4 and L5 levels. The inferior articular process  L4 was resected on the right side.  A large amount of hypertrophic ligmentum flavum was found impressing on the right lateral recesses at L4-5 and narrowing the respective L4 and L5 neuroforamen.So that an opposite sided left side approach to the right L4-5 lateral recess and for decompression of the right thecal sac and a central and right synovial cyst.  using a Penfield #4 and a Derricho for retraction of the thecal sac the right lateral recess was decompressed of hypertrophic ligamentum flavum and the synovial cyst tha was identified resected off the right side medial L4-5 facet. OR microscope magnification was used during this portion procedure. Then the operating room microscope sterilely draped and brought into the field.  Attention then turned to placement of the transforaminal lumbar interbody fusion cage.  Bleeding controlled using bipolar electrocautery thrombin soaked gel cottonoids. Then turned to the left L4-5 level the exposure the posterior lateral aspect this was carried out using a Penfield 4 bipolar  electrocautery to control small bleeders present. Derricho retractor used to retract the thecal sac and L4 nerve root a 15 blade scalpel was used to incise posterior lateral aspect of the left L4-5 disc the disc space at this level showed a rather severe narrowing posteriorly was more open anteriorly so that an osteotome again was used to resect a small portion the posterior superior lip of the vertebral body at L5  in order to gain ease of access into the L4-5 disc space. The space was debrided of degenerative disc material  using pituitary along root the entire disc space was then debrided of degenerative disc material using pituitary rongeurs curettage down to bleeding bone endplates. 12m shaver was used to debride the disc space and pituitary ronguers used to remove the loosened debris. This space was then carefully assess using spacers  a 10.040mtrial cage provided the best fit, the Depuy concorde height adjustable cage 1061m 58m72m chosen so that the permanent LIFT 10.0 mm cage by 28 mm Depuy adjustable Observed on C-arm fluoroscopy to be in good position alignment. The cage at L4-5 was placed anteriorly as best as possible the correct patient's lordosis. The cage was then raised with the insertion handle and the height adjustable mechanism deployed. The cage was then filled with vivigen bone graft. With this then the transforaminal lumbar interbody fusion portion of the case was completed bleeders were controlled using bipolar electrocautery thrombin-soaked Gelfoam were appropriate.Decortication of the right facet joint carried out at L4-5. These were packed with cancellous local bone graft.  The 2 viper corticofixation screws on the left were each placed and then each fastener carefully aligned  to allow for placement of rods. The left rod was a precontoured 40 mm rod. This was then placed into the pedicle screws on the left extending from L4-L5 each of the caps were carefully placed loosely tightened.Caps  onto the left L4 fastener was tightened to 80 foot lbs. Across the right side a precontoured 35mm76manium rod was placed into the L4 and L5  screw fasteners and the upper cap tightened to 80 foot lbs., compression was obtained on the right side between L5 and L4 compressing between the fasteners and tightening the screw caps 85 pounds.Copious amounts of saline solution this was done throughout the case. Cell Saver was used during the case. 50CC cell saver blood returned to the patient.  Hockey stick neuroprobe was used to probe the neuroforamen bilateral L4 and L5 these were determined to be well decompressed. Permanent C-arm images were obtained in AP and lateral plane and oblique planes. Remaining local bone graft was then applied along both lateral posterior lateral region extending from right L4 to L5 facet bed.Gelfoam was then removed spinal canal. The lumbodorsal musculature carefully exam debrided of any devitalized tissue following removal of self retaining retractors were the bleeders were controlled using electrocautery and the area dorsal lumbar muscle were then approximated in the midline with interrupted #1 Vicryl sutures loose the dorsal fascia was reattached to the spinous process of L3  superiorly and L5  inferiorly this was done with #1 Vicryl sutures. Subcutaneous layers then approximated using interrupted 0 Vicryl sutures and 2-0 Vicryl sutures. Skin was closed with a subcutaneous interrupted stitches of 4-0 Vicryl. Then stainless steel staples applied then MedPlex bandage. All instrument and sponge counts were correct. The patient was then returned to a supine position on her bed reactivated extubated and returned to the recovery room in satisfactory condition.    JamesBenjiman Core perform the duties of assistant surgeon during this case. He was present from the beginning of the case to the end of the case assisting in transfer the patient from his stretcher to the OR table and back to the  stretcher at the end of the case. Assisted in careful retraction and suction of the laminectomy site delicate neural structures operating under the operating room microscope. He performed closure of the incision from the fascia to the skin applying the dressing.     @ 08/20/2020,11:34 AM

## 2020-08-20 NOTE — Progress Notes (Signed)
Orthopedic Tech Progress Note Patient Details:  Patrick Wall Jun 05-Apr-1951 [id number] Called in order to [facility] for a LSO Patient ID: Patrick Wall, male   DOB: 11-Dec-1950, 69 y.o.   MRN: [id number]   Patrick Wall 08/20/2020, 12:37 PM

## 2020-08-20 NOTE — Interval H&P Note (Signed)
History and Physical Interval Note:  08/20/2020 7:37 AM  Patrick Wall  has presented today for surgery, with the diagnosis of L4-5 spondylolisthesis with spinal stenosis.  The various methods of treatment have been discussed with the patient and family. After consideration of risks, benefits and other options for treatment, the patient has consented to  Procedure(s): TRANSFORAMINAL LUMBAR INTERBODY FUSION LEFT L4-5 WITH DEPUY MPACT SCREWS RODS AND ADJUSTABLE HEIGHT CAGE, LOCAL BONE GRAFT, ALLOGRAFT BONE GRAFT, VIVIGEN (N/A) as a surgical intervention.  The patient's history has been reviewed, patient examined, no change in status, stable for surgery.  I have reviewed the patient's chart and labs.  Questions were answered to the patient's satisfaction.     [name]

## 2020-08-21 ENCOUNTER — Encounter: Payer: Self-pay | Admitting: Specialist

## 2020-08-21 ENCOUNTER — Telehealth: Payer: Self-pay

## 2020-08-21 DIAGNOSIS — M5416 Radiculopathy, lumbar region: Secondary | ICD-10-CM | POA: Diagnosis not present

## 2020-08-21 DIAGNOSIS — Z7982 Long term (current) use of aspirin: Secondary | ICD-10-CM | POA: Diagnosis not present

## 2020-08-21 DIAGNOSIS — M4316 Spondylolisthesis, lumbar region: Secondary | ICD-10-CM | POA: Diagnosis not present

## 2020-08-21 DIAGNOSIS — M48061 Spinal stenosis, lumbar region without neurogenic claudication: Secondary | ICD-10-CM | POA: Diagnosis not present

## 2020-08-21 LAB — BASIC METABOLIC PANEL
Anion gap: 8 (ref 5–15)
BUN: 13 mg/dL (ref 8–23)
CO2: 25 mmol/L (ref 22–32)
Calcium: 8.8 mg/dL — ABNORMAL LOW (ref 8.9–10.3)
Chloride: 103 mmol/L (ref 98–111)
Creatinine, Ser: 1.09 mg/dL (ref 0.61–1.24)
GFR, Estimated: >60 mL/min (ref >60)
Glucose, Bld: 114 mg/dL — ABNORMAL HIGH (ref 70–99)
Potassium: 3.9 mmol/L (ref 3.5–5.1)
Sodium: 136 mmol/L (ref 135–145)

## 2020-08-21 LAB — CBC
HCT: 36.1 % — ABNORMAL LOW (ref 39.0–52.0)
Hemoglobin: 11.9 g/dL — ABNORMAL LOW (ref 13.0–17.0)
MCH: 31.5 pg (ref 26.0–34.0)
MCHC: 33 g/dL (ref 30.0–36.0)
MCV: 95.5 fL (ref 80.0–100.0)
Platelets: 137 10*3/uL — ABNORMAL LOW (ref 150–400)
RBC: 3.78 MIL/uL — ABNORMAL LOW (ref 4.22–5.81)
RDW: 12.8 % (ref 11.5–15.5)
WBC: 7.8 10*3/uL (ref 4.0–10.5)
nRBC: 0 % (ref 0.0–0.2)

## 2020-08-21 MED ORDER — HYDROCODONE-ACETAMINOPHEN 7.5-325 MG PO TABS: 1.0000 | ORAL_TABLET | ORAL | 0 refills | Status: AC | PRN

## 2020-08-21 MED ORDER — METHOCARBAMOL 500 MG PO TABS: 500.0000 mg | ORAL_TABLET | Freq: Four times a day (QID) | ORAL | 1 refills | Status: DC | PRN

## 2020-08-21 MED ORDER — DOCUSATE SODIUM 100 MG PO CAPS: 100.0000 mg | ORAL_CAPSULE | Freq: Two times a day (BID) | ORAL | 0 refills | Status: DC

## 2020-08-21 MED ORDER — CALCIUM CARBONATE ANTACID 500 MG PO CHEW: 500.0000 mg | CHEWABLE_TABLET | Freq: Every day | ORAL | Status: DC | PRN

## 2020-08-21 NOTE — Evaluation (Signed)
Physical Therapy Evaluation Patient Details Name: Patrick Wall MRN: [id number] DOB: 1951-04-11 Today's Date: 08/21/2020   History of Present Illness  69 y.o. male presenting with L4-5 spondylolisthesis with spinal stenosis s/p TLIF.   PMHx significant for GERd, HLD, and labral tear of shoulder.  Clinical Impression  Patient presents with pain and post surgical deficits s/p above surgery. Pt independent and active (triathalons) PTA. Today, pt tolerated transfers, gait training and stair training Mod I without difficulty. Reports mild pain in back. Education re: back precautions, log roll technique, positioning, walking program, brace application etc. Pt does not require skilled therapy services as pt functioning at Mod I level. All education completed. Discharge from therapy.    Follow Up Recommendations No PT follow up    Equipment Recommendations  None recommended by PT    Recommendations for Other Services       Precautions / Restrictions Precautions Precautions: Back Precaution Booklet Issued: Yes (comment) Precaution Comments: Provied handout and reviewed precautions Required Braces or Orthoses: Spinal Brace Spinal Brace: Lumbar corset;Applied in sitting position Restrictions Weight Bearing Restrictions: No      Mobility  Bed Mobility Overal bed mobility: Modified Independent             General bed mobility comments: Sitting in chair upon PT arrival.    Transfers Overall transfer level: Modified independent Equipment used: None             General transfer comment: Stood from chair without difficulty, good technique.  Ambulation/Gait Ambulation/Gait assistance: Modified independent (Device/Increase time) Gait Distance (Feet): 400 Feet Assistive device: None Gait Pattern/deviations: WFL(Within Functional Limits)   Gait velocity interpretation: 1.31 - 2.62 ft/sec, indicative of limited community ambulator General Gait Details: Steady gait, no evidence of  imbalance with head turns.  Stairs Stairs: Yes Stairs assistance: Modified independent (Device/Increase time) Stair Management: One rail Right;Forwards Number of Stairs: 13 General stair comments: Cues for technique/safety.  Wheelchair Mobility    Modified Rankin (Stroke Patients Only)       Balance Overall balance assessment: No apparent balance deficits (not formally assessed)                                           Pertinent Vitals/Pain Pain Assessment: 0-10 Pain Score: 1  Pain Location: Low back Pain Descriptors / Indicators: Dull;Operative site guarding Pain Intervention(s): Monitored during session;Repositioned    Home Living Family/patient expects to be discharged to:: Private residence Living Arrangements: Spouse/significant other Available Help at Discharge: Family;Available PRN/intermittently (wife works in Software engineer but travels home on weekends) Type of Home: House Home Access: Stairs to enter Entrance Stairs-Rails: Right Entrance Stairs-Number of Steps: 2-3 from garage Home Layout: Two level;Able to live on main level with bedroom/bathroom Home Equipment: Shower seat - built in      Prior Function Level of Independence: Independent         Comments: Independent with ADLs/IADLs without AD. Patient was driving PTA. Retired. Loves to do triathalons.     Hand Dominance        Extremity/Trunk Assessment   Upper Extremity Assessment Upper Extremity Assessment: Defer to OT evaluation    Lower Extremity Assessment Lower Extremity Assessment: Overall WFL for tasks assessed    Cervical / Trunk Assessment Cervical / Trunk Assessment: Other exceptions Cervical / Trunk Exceptions: s/p spinal sx.  Communication   Communication: HOH  Cognition Arousal/Alertness: Awake/alert Behavior  During Therapy: WFL for tasks assessed/performed Overall Cognitive Status: Within Functional Limits for tasks assessed                                         General Comments General comments (skin integrity, edema, etc.): Wife present towards end of session.    Exercises     Assessment/Plan    PT Assessment Patent does not need any further PT services  PT Problem List         PT Treatment Interventions      PT Goals (Current goals can be found in the Care Plan section)  Acute Rehab PT Goals Patient Stated Goal: To return home and get back to being active PT Goal Formulation: All assessment and education complete, DC therapy    Frequency     Barriers to discharge        Co-evaluation               AM-PAC PT "6 Clicks" Mobility  Outcome Measure Help needed turning from your back to your side while in a flat bed without using bedrails?: None Help needed moving from lying on your back to sitting on the side of a flat bed without using bedrails?: None Help needed moving to and from a bed to a chair (including a wheelchair)?: None Help needed standing up from a chair using your arms (e.g., wheelchair or bedside chair)?: None Help needed to walk in hospital room?: None Help needed climbing 3-5 steps with a railing? : None 6 Click Score: 24    End of Session Equipment Utilized During Treatment: Back brace Activity Tolerance: Patient tolerated treatment well Patient left: Other (comment) (standing in room) Nurse Communication: Mobility status PT Visit Diagnosis: Pain Pain - part of body:  (back)    Time: 5364-6803 PT Time Calculation (min) (ACUTE ONLY): 19 min   Charges:   PT Evaluation $PT Eval Low Complexity: 1 Low          Vale Haven, PT, DPT Acute Rehabilitation Services Pager [phone] Office [phone]      Blake Divine A Valeta Paz 08/21/2020, 9:06 AM

## 2020-08-21 NOTE — Telephone Encounter (Signed)
Lilly from [facility] called and stated pt's norco cant be filled because it is coming up in their system that dr. Otelia Sergeant is not allowed to prescribe narcotics. Her CB # to discuss  [phone]

## 2020-08-21 NOTE — Progress Notes (Signed)
Patient alert and oriented, mae's well, voiding adequate amount of urine, swallowing without difficulty, no c/o pain at time of discharge. Patient discharged home with family. Script and discharged instructions given to patient. Patient and family stated understanding of instructions given. Patient has an appointment with Dr. Nitka 

## 2020-08-21 NOTE — Telephone Encounter (Signed)
I called the pharmacy to get clarification on what was going on, she states that there was an issue with their system and it was rejecting a lot of DEA's and they have sinced fixed the issue.

## 2020-08-21 NOTE — Discharge Instructions (Signed)

## 2020-08-21 NOTE — Progress Notes (Signed)
     Subjective: 1 Day Post-Op Procedure(s) (LRB): TRANSFORAMINAL LUMBAR INTERBODY FUSION LEFT LUMBAR FOUR-FIVE WITH DEPUY MPACT SCREWS RODS AND ADJUSTABLE HEIGHT CAGE, LOCAL BONE GRAFT, ALLOGRAFT BONE GRAFT, VIVIGEN (N/A) Awake, alert and oriented x 4. No numbness, no weakness, walking in hallway this AM. Nausea is gone, present with post anesthesia. Foley discontinued.  Patient reports pain as moderate.    Objective:   VITALS:  Temp:  [97.6 F (36.4 C)-98.1 F (36.7 C)] 98 F (36.7 C) (12/21 0735) Pulse Rate:  [51-69] 69 (12/21 0735) Resp:  [10-20] 17 (12/21 0735) BP: (101-128)/(65-86) 122/77 (12/21 0735) SpO2:  [90 %-100 %] 99 % (12/21 0735)  Neurologically intact ABD soft Neurovascular intact Sensation intact distally Intact pulses distally Dorsiflexion/Plantar flexion intact Incision: dressing C/D/I, no drainage and Dressing changed to opsite waffle dressing.  No cellulitis present Compartment soft   LABS No results for input(s): HGB, WBC, PLT in the last 72 hours. No results for input(s): NA, K, CL, CO2, BUN, CREATININE, GLUCOSE in the last 72 hours. No results for input(s): LABPT, INR in the last 72 hours.   Assessment/Plan: 1 Day Post-Op Procedure(s) (LRB): TRANSFORAMINAL LUMBAR INTERBODY FUSION LEFT LUMBAR FOUR-FIVE WITH DEPUY MPACT SCREWS RODS AND ADJUSTABLE HEIGHT CAGE, LOCAL BONE GRAFT, ALLOGRAFT BONE GRAFT, VIVIGEN (N/A)  Advance diet Up with therapy Discharge home with home health  Patrick Wall 08/21/2020, 7:43 AM Patient ID: Patrick Wall, male   DOB: 1951/05/26, 69 y.o.   MRN: [id number]

## 2020-08-21 NOTE — Evaluation (Signed)
Occupational Therapy Evaluation Patient Details Name: Patrick Wall MRN: [id number] DOB: [birth year] Today's Date: 08/21/2020    History of Present Illness 69 y.o. male presenting with L4-5 spondylolisthesis with spinal stenosis s/p TLIF.   PMHx significant for GERd, HLD, and labral tear of shoulder.   Clinical Impression   PTA patient was living with his wife in a 2-level private residence with bedroom/bathroom on main level. Patient reports independence with ADLs/IADLs without AD. Patient was still driving. Patient currently functioning at baseline level demonstrating LB dressing, toileting, and grooming standing at sink level with I to Mod I without AD and good adherence to back precautions after written/verbal education. Education also provided on home set-up for safety. Patient with additional questions on maintaining surgical dressing upon d/c. RN notified. Patient does not require continued acute occupational therapy services with OT to sign off at this time.     Follow Up Recommendations  No OT follow up    Equipment Recommendations  None recommended by OT    Recommendations for Other Services       Precautions / Restrictions Precautions Precautions: Back Precaution Booklet Issued: Yes (comment) Precaution Comments: Patient able to recall 3/3 back precautions. Required Braces or Orthoses: Spinal Brace Spinal Brace: Lumbar corset;Applied in sitting position Restrictions Weight Bearing Restrictions: No      Mobility Bed Mobility Overal bed mobility: Modified Independent             General bed mobility comments: Good recall of log-rolling technique.    Transfers Overall transfer level: Modified independent Equipment used: None                  Balance Overall balance assessment: No apparent balance deficits (not formally assessed)                                         ADL either performed or assessed with clinical judgement   ADL  Overall ADL's : At baseline                                       General ADL Comments: Independent to Mod I with ADLs/ADL transfers and functional mobility without AD.     Vision Patient Visual Report: No change from baseline Vision Assessment?: No apparent visual deficits     Perception     Praxis      Pertinent Vitals/Pain Pain Assessment: 0-10 Pain Score: 2  Pain Location: Low back Pain Descriptors / Indicators: Dull Pain Intervention(s): Limited activity within patient's tolerance;Monitored during session;Premedicated before session;Repositioned     Hand Dominance     Extremity/Trunk Assessment Upper Extremity Assessment Upper Extremity Assessment: Overall WFL for tasks assessed   Lower Extremity Assessment Lower Extremity Assessment: Defer to PT evaluation   Cervical / Trunk Assessment Cervical / Trunk Assessment: Other exceptions Cervical / Trunk Exceptions: s/p spinal sx.   Communication Communication Communication: HOH   Cognition Arousal/Alertness: Awake/alert Behavior During Therapy: WFL for tasks assessed/performed Overall Cognitive Status: Within Functional Limits for tasks assessed                                     General Comments       Exercises     Shoulder Instructions  Home Living Family/patient expects to be discharged to:: Private residence Living Arrangements: Spouse/significant other Available Help at Discharge: Family;Available PRN/intermittently (Wife works in [place] but travels home on weekends.) Type of Home: House Home Access: Stairs to enter [employer] of Steps: 2-3 from garage Entrance Stairs-Rails: Right Home Layout: Two level;Able to live on main level with bedroom/bathroom     Bathroom Shower/Tub: Producer, television/film/video: Handicapped height     Home Equipment: Shower seat - built in          Prior Functioning/Environment Level of Independence: Independent         Comments: Independent with ADLs/IADLs without AD. Patient was driving PTA. Retired.        OT Problem List: Pain      OT Treatment/Interventions:      OT Goals(Current goals can be found in the care plan section) Acute Rehab OT Goals Patient Stated Goal: To return home.  OT Frequency:     Barriers to D/C:            Co-evaluation              AM-PAC OT "6 Clicks" Daily Activity     Outcome Measure Help from another person eating meals?: None Help from another person taking care of personal grooming?: None Help from another person toileting, which includes using toliet, bedpan, or urinal?: None Help from another person bathing (including washing, rinsing, drying)?: None Help from another person to put on and taking off regular upper body clothing?: None Help from another person to put on and taking off regular lower body clothing?: None 6 Click Score: 24   End of Session Equipment Utilized During Treatment: Back brace Nurse Communication: Other (comment) (Patient with questions about surgical dressing.)  Activity Tolerance: Patient tolerated treatment well Patient left: in chair;with call bell/phone within reach  OT Visit Diagnosis: Pain Pain - part of body:  (Low back)                Time: 2706-2376 OT Time Calculation (min): 16 min Charges:  OT General Charges $OT Visit: 1 Visit OT Evaluation $OT Eval Low Complexity: 1 Low  Johncarlos Holtsclaw H. OTR/L Supplemental OT, Department of rehab services [phone]  Aspyn Warnke R H. 08/21/2020, 8:51 AM

## 2020-08-23 MED FILL — Sodium Chloride IV Soln 0.9%: INTRAVENOUS | Qty: 1000 | Status: AC

## 2020-08-23 MED FILL — Heparin Sodium (Porcine) Inj 1000 Unit/ML: INTRAMUSCULAR | Qty: 30 | Status: AC

## 2020-09-03 ENCOUNTER — Other Ambulatory Visit: Payer: Self-pay

## 2020-09-03 ENCOUNTER — Encounter: Payer: Self-pay | Admitting: Specialist

## 2020-09-03 ENCOUNTER — Ambulatory Visit: Payer: BC Managed Care – PPO | Admitting: Specialist

## 2020-09-03 ENCOUNTER — Ambulatory Visit (INDEPENDENT_AMBULATORY_CARE_PROVIDER_SITE_OTHER): Payer: BC Managed Care – PPO

## 2020-09-03 VITALS — BP 145/88 | HR 65 | Ht 75.0 in | Wt 203.0 lb

## 2020-09-03 DIAGNOSIS — Z981 Arthrodesis status: Secondary | ICD-10-CM

## 2020-09-03 NOTE — Progress Notes (Signed)
   Post-Op Visit Note   Patient: Patrick Wall           Date of Birth: May 03, 1951           MRN: [id number] Visit Date: 09/03/2020 PCP: Donita Brooks, MD   Assessment & Plan: 2 week post op  Chief Complaint:  Chief Complaint  Patient presents with  . Lower Back - Routine Post Op   Visit Diagnoses:  1. S/P lumbar spinal fusion   Incision is healed staples removed Legs NV normal.  Plan:Avoid frequent bending and stooping  No lifting greater than 10-15 lbs. May use ice or moist heat for pain. Weight loss is of benefit. Tylenol or 200 mg mg ibuprofen. Exercise is important to improve your indurance and does allow people to function better inspite of back pain.    Follow-Up Instructions: No follow-ups on file.   Orders:  Orders Placed This Encounter  Procedures  . XR Lumbar Spine 2-3 Views   No orders of the defined types were placed in this encounter.   Imaging: XR Lumbar Spine 2-3 Views  Result Date: 09/03/2020 AP and lateral radiographs of the lumbar show fixation of the L4-5 level with pedicle screws and rods  And cage in position and alignment.   PMFS History: Patient Active Problem List   Diagnosis Date Noted  . Fusion of spine of thoracolumbar region 08/20/2020  . Spondylolisthesis, lumbar region   . Lumbar spinal stenosis   . Bilateral knee pain 06/03/2016  . Abnormality of gait 06/03/2016  . Left foot pain 03/18/2016  . Labral tear of shoulder   . Hyperlipidemia   . Hypertension   . Hearing loss   . 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 GERD (gastroesophageal reflux disease)    tums  . Hearing loss   . History of kidney stones   . Hyperlipidemia   . Labral tear of shoulder   . Lumbar spinal stenosis     Family History  Problem Relation Age of Onset  . Cancer Mother        skin  . Heart disease Mother   . Vision loss Mother   . Early death Father   . Arthritis Maternal Grandmother   .  Cancer Maternal Grandmother   . Hyperlipidemia Maternal Grandmother   . Hypertension Maternal Grandmother   . Cancer Maternal Grandfather   . Heart disease Paternal Grandfather   . Stroke Paternal Grandfather     Past Surgical History:  Procedure Laterality Date  . COLONOSCOPY    . EYE SURGERY  2000   lasic  . MOUTH SURGERY     teeth removed, dental implants placed  . TONSILLECTOMY    . VASECTOMY  1983   Social History   Occupational History  . Not on file  Tobacco Use  . Smoking status: Never Smoker  . Smokeless tobacco: Never Used  Vaping Use  . Vaping Use: Never used  Substance and Sexual Activity  . Alcohol use: Yes    Alcohol/week: 9.0 standard drinks    Types: 5 Glasses of wine, 4 Cans of beer per week  . Drug use: No  . Sexual activity: Yes    Birth control/protection: Surgical    Comment: vasectomy

## 2020-09-03 NOTE — Addendum Note (Signed)
Addended by: [name] on: 09/03/2020 02:34 PM   Modules accepted: Orders

## 2020-09-03 NOTE — Patient Instructions (Signed)
Plan:Avoid frequent bending and stooping  No lifting greater than 10-15 lbs. May use ice or moist heat for pain. Weight loss is of benefit. Tylenol or 200 mg mg ibuprofen. Exercise is important to improve your indurance and does allow people to function better inspite of back pain.

## 2020-09-07 NOTE — Discharge Summary (Signed)
Patient ID: Parag Dorton MRN: [id number] DOB/AGE: 12/21/1950 70 y.o.  Admit date: 08/20/2020 Discharge date: 08/21/2020 Admission Diagnoses:  Active Problems:   Fusion of spine of thoracolumbar region   Spondylolisthesis, lumbar region   Discharge Diagnoses:  Active Problems:   Fusion of spine of thoracolumbar region   Spondylolisthesis, lumbar region  status post Procedure(s): TRANSFORAMINAL LUMBAR INTERBODY FUSION LEFT LUMBAR FOUR-FIVE WITH DEPUY MPACT SCREWS RODS AND ADJUSTABLE HEIGHT CAGE, LOCAL BONE GRAFT, ALLOGRAFT BONE GRAFT, VIVIGEN  Past Medical History:  Diagnosis Date  . GERD (gastroesophageal reflux disease)    tums  . Hearing loss   . History of kidney stones   . Hyperlipidemia   . Labral tear of shoulder   . Lumbar spinal stenosis     Surgeries: Procedure(s): TRANSFORAMINAL LUMBAR INTERBODY FUSION LEFT LUMBAR FOUR-FIVE WITH DEPUY MPACT SCREWS RODS AND ADJUSTABLE HEIGHT CAGE, LOCAL BONE GRAFT, ALLOGRAFT BONE GRAFT, VIVIGEN on 08/20/2020   Consultants:   Discharged Condition: Improved  Hospital Course: Nicolo Tomko is an 70 y.o. male who was admitted 08/20/2020 for operative treatment of lumbar stenosis. Patient failed conservative treatments (please see the history and physical for the specifics) and had severe unremitting pain that affects sleep, daily activities and work/hobbies. After pre-op clearance, the patient was taken to the operating room on 08/20/2020 and underwent  Procedure(s): TRANSFORAMINAL LUMBAR INTERBODY FUSION LEFT LUMBAR FOUR-FIVE WITH DEPUY MPACT SCREWS RODS AND ADJUSTABLE HEIGHT CAGE, LOCAL BONE GRAFT, ALLOGRAFT BONE GRAFT, VIVIGEN.    Patient was given perioperative antibiotics:  Anti-infectives (From admission, onward)   Start     Dose/Rate Route Frequency Ordered Stop   08/20/20 1600  ceFAZolin (ANCEF) IVPB 2g/100 mL premix        2 g 200 mL/hr over 30 Minutes Intravenous Every 8 hours 08/20/20 1248 08/20/20 2339   08/20/20 0645   ceFAZolin (ANCEF) IVPB 2g/100 mL premix        2 g 200 mL/hr over 30 Minutes Intravenous On call to O.R. 08/20/20 0630 08/20/20 0805       Patient was given sequential compression devices and early ambulation to prevent DVT.   Patient benefited maximally from hospital stay and there were no complications. At the time of discharge, the patient was urinating/moving their bowels without difficulty, tolerating a regular diet, pain is controlled with oral pain medications and they have been cleared by PT/OT.   Recent vital signs: No data found.   Recent laboratory studies: No results for input(s): WBC, HGB, HCT, PLT, NA, K, CL, CO2, BUN, CREATININE, GLUCOSE, INR, CALCIUM in the last 72 hours.  Invalid input(s): PT, 2   Discharge Medications:   Allergies as of 08/21/2020      Reactions   Sulfa Antibiotics    Unknown reaction      Medication List    TAKE these medications   aspirin EC 81 MG tablet Take 81 mg by mouth daily.   atorvastatin 40 MG tablet Commonly known as: LIPITOR Take 1 tablet (40 mg total) by mouth daily.   multivitamin capsule Take 1 capsule by mouth daily.   Vitamin D-3 125 MCG (5000 UT) Tabs Take 5,000 Units by mouth 3 (three) times a week.     ASK your doctor about these medications   HYDROcodone-acetaminophen 7.5-325 MG tablet Commonly known as: NORCO Take 1-2 tablets by mouth every 4 (four) hours as needed for up to 7 days for moderate pain ((score 4 to 6)). Ask about: Should I take this medication?  Diagnostic Studies: DG Lumbar Spine Complete  Result Date: 08/20/2020 CLINICAL DATA:  TLIF at L4-5 EXAM: LUMBAR SPINE - COMPLETE 4+ VIEW; DG C-ARM 1-60 MIN COMPARISON:  04/25/2020 FINDINGS: Lumbar fluoroscopic images show L4 and L5 fusion with pedicle screws, rods, and intervertebral graft. No evidence of fracture or other complicating feature. IMPRESSION: Fluoroscopy for L4-5 fusion.  No unexpected finding. Electronically Signed   By: Monte Fantasia M.D.   On: 08/20/2020 11:17   MR Lumbar Spine w/o contrast  Result Date: 08/09/2020 CLINICAL DATA:  Spinal stenosis of lumbosacral region. Spinal stenosis, lumbosacral. Additional history provided by scanning technologist: Patient reports low back pain that radiates into the buttocks and legs. EXAM: MRI LUMBAR SPINE WITHOUT CONTRAST TECHNIQUE: Multiplanar, multisequence MR imaging of the lumbar spine was performed. No intravenous contrast was administered. COMPARISON:  Lumbar spine radiographs 04/25/2020. Lumbar spine MRI 09/23/2019. FINDINGS: Segmentation: 5 lumbar vertebrae (correlating with prior lumbar spine radiographs 04/25/2020). Alignment: Mild lumbar dextrocurvature. Trace L2-L3 grade 1 retrolisthesis. Trace L3-L4 and L4-L5 grade 1 anterolisthesis. Vertebrae: Vertebral body height is maintained. No significant marrow edema or focal suspicious osseous lesion. Redemonstrated L4 inferior endplate Schmorl node. Conus medullaris and cauda equina: Conus extends to the L2 level. No signal abnormality within the visualized distal spinal cord. Paraspinal and other soft tissues: Incompletely assessed bilateral renal cysts. Paraspinal soft tissues within normal limits. Disc levels: Unless otherwise stated, the level by level findings below have not significantly changed since prior MRI 09/23/2019. Moderate L4-L5 disc degeneration. No more than mild disc degeneration at the remaining levels. T12-L1: No significant disc herniation or stenosis. L1-L2: No significant disc herniation or stenosis. L2-L3: Grade 1 retrolisthesis. Minimal disc bulge. No significant spinal canal or foraminal stenosis. L3-L4: Trace anterolisthesis. Disc bulge. Moderate facet arthrosis with ligamentum flavum hypertrophy. Mild bilateral subarticular and central canal narrowing without frank nerve root impingement. Moderate bilateral neural foraminal narrowing. L4-L5: Trace anterolisthesis. Disc uncovering with disc bulge. Advanced facet  arthrosis with ligamentum flavum hypertrophy. Small bilateral facet joint effusions. New from the prior MRI, there is a ventrally projecting synovial facet cyst at midline measuring 8 mm. Small posteriorly projecting synovial facet cysts bilaterally. Unchanged severe bilateral subarticular stenosis. Progressive severe central canal stenosis. Moderate bilateral neural foraminal narrowing (greater on the left). L5-S1: Disc bulge. Mild facet arthrosis (greater on the left). No significant spinal canal stenosis. Mild bilateral neural foraminal narrowing (greater on the left). IMPRESSION: At L4-L5 there is trace anterolisthesis. Moderate disc degeneration. Disc uncovering with disc bulge. Advanced facet arthrosis with ligamentum flavum hypertrophy. New from the prior MRI of 09/23/2019, there is an 8 mm ventrally projecting synovial facet cyst at midline which contributes to progressive severe central canal stenosis. Unchanged severe bilateral subarticular stenosis. Unchanged moderate bilateral neural foraminal narrowing. Small bilateral facet joint effusions. Lumbar spondylosis is otherwise unchanged. No more than mild spinal canal stenosis at the remaining levels. Moderate bilateral neural foraminal narrowing at L3-L4 and L5-S1. Electronically Signed   By: Kellie Simmering DO   On: 08/09/2020 07:41   DG C-Arm 1-60 Min  Result Date: 08/20/2020 CLINICAL DATA:  TLIF at L4-5 EXAM: LUMBAR SPINE - COMPLETE 4+ VIEW; DG C-ARM 1-60 MIN COMPARISON:  04/25/2020 FINDINGS: Lumbar fluoroscopic images show L4 and L5 fusion with pedicle screws, rods, and intervertebral graft. No evidence of fracture or other complicating feature. IMPRESSION: Fluoroscopy for L4-5 fusion.  No unexpected finding. Electronically Signed   By: Monte Fantasia M.D.   On: 08/20/2020 11:17   XR Lumbar Spine  2-3 Views  Result Date: 09/03/2020 AP and lateral radiographs of the lumbar show fixation of the L4-5 level with pedicle screws and rods  And cage in  position and alignment.   Discharge Instructions    Ambulatory referral to Home Health   Complete by: As directed    Please evaluate Nathanel Tallman for admission to [facility].  Disciplines requested: Physical Therapy  Services to provide: Strengthening Exercises  Physician to follow patient's care (the person listed here will be responsible for signing ongoing orders): Referring Provider  Requested Start of Care Date: Tomorrow  I certify that this patient is under my care and that I, or a Nurse Practitioner or Physician's Assistant working with me, had a face-to-face encounter that meets the physician face-to-face requirements with patient on 08/21/2020. The encounter with the patient was in whole, or in part for the following medical condition(s) which is the primary reason for home health care (List medical condition). Post lumbar fusion rehabilitation.  Special Instructions:  Eval and treat, brace when oob, avoid bending and stooping, walking, gait program LE strengthening.   Does the patient have Medicare or Medicaid?: Yes   The encounter with the patient was in whole, or in part, for the following medical condition, which is the primary reason for home health care: Post lumbar fusion rehabilitation, gait training, ADL training.   Reason for Medically Necessary Home Health Services:  Therapy- Investment banker, operational, Patent examiner Therapy- Instruction on use of Assistive Device for Ambulation on all Surfaces Therapy- Instruction on Safe use of Assistive Devices for ADLs Therapy- Home Adaptation to Facilitate Safety Therapy- Therapeutic Exercises to Increase Strength and Endurance     My clinical findings support the need for the above services:  Pain interferes with ambulation/mobility Unable to leave home safely without assistance and/or assistive device     I certify that, based on my findings, the following services are medically necessary home health services:  Physical therapy   Further, I certify that my clinical findings support that this patient is homebound due to:  Pain interferes with ambulation/mobility Unable to leave home safely without assistance     Call MD / Call 911   Complete by: As directed    If you experience chest pain or shortness of breath, CALL 911 and be transported to the hospital emergency room.  If you develope a fever above 101 F, pus (white drainage) or increased drainage or redness at the wound, or calf pain, call your surgeon's office.   Constipation Prevention   Complete by: As directed    Drink plenty of fluids.  Prune juice may be helpful.  You may use a stool softener, such as Colace (over the counter) 100 mg twice a day.  Use MiraLax (over the counter) for constipation as needed.   Diet - low sodium heart healthy   Complete by: As directed    Discharge instructions   Complete by: As directed    Call if there is increasing drainage, fever greater than 101.5, severe head aches, and worsening nausea or light sensitivity. If shortness of breath, bloody cough or chest tightness or pain go to an emergency room. No lifting greater than 10 lbs. Avoid bending, stooping and twisting. Use brace when sitting and out of bed even to go to bathroom. Walk in house for first 2 weeks then may start to get out slowly increasing distances up to one mile by 4-6 weeks post op. After 5 days may shower and  change dressing following bathing with shower.When bathing remove the brace shower and replace brace before getting out of the shower. If drainage, keep dry dressing and do not bathe the incision, use an moisture impervious dressing. Please call and return for scheduled follow up appointment 2 weeks from the time of surgery.   Driving restrictions   Complete by: As directed    No driving for 2 weeks   Increase activity slowly as tolerated   Complete by: As directed    Lifting restrictions   Complete by: As directed    No lifting  for 8 weeks       Follow-up Information    Kerrin Champagne, MD Follow up in 2 week(s).   Specialty: Orthopedic Surgery Why: incision check Contact information: [address] [place] [address] [phone]        Donita Brooks, MD .   Specialty: [facility] Contact information: [address] [place] [address] [phone]               Discharge Plan:  discharge to home  Disposition:     Signed: Zonia Kief 09/07/2020, 10:50 AM

## 2020-09-11 ENCOUNTER — Encounter: Payer: Self-pay | Admitting: Specialist

## 2020-10-01 ENCOUNTER — Ambulatory Visit (INDEPENDENT_AMBULATORY_CARE_PROVIDER_SITE_OTHER): Payer: BC Managed Care – PPO | Admitting: Specialist

## 2020-10-01 ENCOUNTER — Encounter: Payer: Self-pay | Admitting: Specialist

## 2020-10-01 ENCOUNTER — Other Ambulatory Visit: Payer: Self-pay

## 2020-10-01 ENCOUNTER — Ambulatory Visit (INDEPENDENT_AMBULATORY_CARE_PROVIDER_SITE_OTHER): Payer: BC Managed Care – PPO

## 2020-10-01 VITALS — BP 160/91 | HR 51 | Ht 75.0 in | Wt 203.0 lb

## 2020-10-01 DIAGNOSIS — Z981 Arthrodesis status: Secondary | ICD-10-CM | POA: Diagnosis not present

## 2020-10-01 NOTE — Progress Notes (Signed)
   Post-Op Visit Note   Patient: Patrick Wall           Date of Birth: Jan 22, 1951           MRN: [id number] Visit Date: 10/01/2020 PCP: Donita Brooks, MD   Assessment & Plan:5.5 weeks post op L4-5  Chief Complaint:  Chief Complaint  Patient presents with  . Lower Back - Routine Post Op   Visit Diagnoses:  1. S/P lumbar spinal fusion   Incision is healed, no swelling present. Motor is normal. SLR negative.  Plan: Avoid frequent bending and stooping  No lifting greater than 20 lbs. May use ice or moist heat for pain. Weight loss is of benefit. Best medication for lumbar disc disease is arthritis medications like tylenol and motrin Exercise is important to improve your indurance and does allow people to function better inspite of back pain. Avoid torsional stresses and lifting greater than 20 lbs. Limit cycling to 1 hour.   Follow-Up Instructions: No follow-ups on file.   Orders:  Orders Placed This Encounter  Procedures  . XR Lumbar Spine 2-3 Views   No orders of the defined types were placed in this encounter.   Imaging: No results found.  PMFS History: Patient Active Problem List   Diagnosis Date Noted  . Fusion of spine of thoracolumbar region 08/20/2020  . Spondylolisthesis, lumbar region   . Lumbar spinal stenosis   . Bilateral knee pain 06/03/2016  . Abnormality of gait 06/03/2016  . Left foot pain 03/18/2016  . Labral tear of shoulder   . Hyperlipidemia   . Hypertension   . Hearing loss   . 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 GERD (gastroesophageal reflux disease)    tums  . Hearing loss   . History of kidney stones   . Hyperlipidemia   . Labral tear of shoulder   . Lumbar spinal stenosis     Family History  Problem Relation Age of Onset  . Cancer Mother        skin  . Heart disease Mother   . Vision loss Mother   . Early death Father   . Arthritis Maternal Grandmother   . Cancer  Maternal Grandmother   . Hyperlipidemia Maternal Grandmother   . Hypertension Maternal Grandmother   . Cancer Maternal Grandfather   . Heart disease Paternal Grandfather   . Stroke Paternal Grandfather     Past Surgical History:  Procedure Laterality Date  . COLONOSCOPY    . EYE SURGERY  2000   lasic  . MOUTH SURGERY     teeth removed, dental implants placed  . TONSILLECTOMY    . VASECTOMY  1983   Social History   Occupational History  . Not on file  Tobacco Use  . Smoking status: Never Smoker  . Smokeless tobacco: Never Used  Vaping Use  . Vaping Use: Never used  Substance and Sexual Activity  . Alcohol use: Yes    Alcohol/week: 9.0 standard drinks    Types: 5 Glasses of wine, 4 Cans of beer per week  . Drug use: No  . Sexual activity: Yes    Birth control/protection: Surgical    Comment: vasectomy

## 2020-10-01 NOTE — Patient Instructions (Signed)
Plan: Avoid frequent bending and stooping  No lifting greater than 20 lbs. May use ice or moist heat for pain. Weight loss is of benefit. Best medication for lumbar disc disease is arthritis medications like tylenol and motrin Exercise is important to improve your indurance and does allow people to function better inspite of back pain. Avoid torsional stresses and lifting greater than 20 lbs. Limit cycling to 1 hour.

## 2020-11-12 ENCOUNTER — Ambulatory Visit (INDEPENDENT_AMBULATORY_CARE_PROVIDER_SITE_OTHER): Payer: BC Managed Care – PPO | Admitting: Specialist

## 2020-11-12 ENCOUNTER — Ambulatory Visit: Payer: Self-pay

## 2020-11-12 ENCOUNTER — Encounter: Payer: Self-pay | Admitting: Specialist

## 2020-11-12 ENCOUNTER — Other Ambulatory Visit: Payer: Self-pay

## 2020-11-12 VITALS — BP 155/90 | HR 47 | Ht 75.0 in | Wt 203.0 lb

## 2020-11-12 DIAGNOSIS — Z981 Arthrodesis status: Secondary | ICD-10-CM

## 2020-11-12 NOTE — Patient Instructions (Signed)
Avoid frequent bending and stooping  No lifting greater than 10-15 lbs. May use ice or moist heat for pain. Weight loss is of benefit. Best medication for lumbar disc disease is arthritis medications like motrin. Exercise is important to improve your indurance and does allow people to function better inspite of back pain. Walk as tolerated level ground. May swim as much as you care too. Yoga and stretching

## 2020-11-12 NOTE — Progress Notes (Signed)
   Post-Op Visit Note   Patient: Patrick Wall           Date of Birth: [birth year]           MRN: [id number] Visit Date: 11/12/2020 PCP: Donita Brooks, MD   Assessment & Plan:  Chief Complaint:  Chief Complaint  Patient presents with  . Lower Back - Follow-up   Visit Diagnoses:  1. S/P lumbar spinal fusion     Plan: Avoid frequent bending and stooping  No lifting greater than 10-15 lbs. May use ice or moist heat for pain. Weight loss is of benefit. Best medication for lumbar disc disease is arthritis medications like motrin. Exercise is important to improve your indurance and does allow people to function better inspite of back pain. Walk as tolerated level ground. May swim as much as you care too. Yoga and stretching  Follow-Up Instructions: No follow-ups on file.   Orders:  Orders Placed This Encounter  Procedures  . XR Lumbar Spine 2-3 Views   No orders of the defined types were placed in this encounter.   Imaging: No results found.  PMFS History: Patient Active Problem List   Diagnosis Date Noted  . Fusion of spine of thoracolumbar region 08/20/2020  . Spondylolisthesis, lumbar region   . Lumbar spinal stenosis   . Bilateral knee pain 06/03/2016  . Abnormality of gait 06/03/2016  . Left foot pain 03/18/2016  . Labral tear of shoulder   . Hyperlipidemia   . Hypertension   . Hearing loss   . 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 GERD (gastroesophageal reflux disease)    tums  . Hearing loss   . History of kidney stones   . Hyperlipidemia   . Labral tear of shoulder   . Lumbar spinal stenosis     Family History  Problem Relation Age of Onset  . Cancer Mother        skin  . Heart disease Mother   . Vision loss Mother   . Early death Father   . Arthritis Maternal Grandmother   . Cancer Maternal Grandmother   . Hyperlipidemia Maternal Grandmother   . Hypertension Maternal Grandmother   . Cancer  Maternal Grandfather   . Heart disease Paternal Grandfather   . Stroke Paternal Grandfather     Past Surgical History:  Procedure Laterality Date  . COLONOSCOPY    . EYE SURGERY  2000   lasic  . MOUTH SURGERY     teeth removed, dental implants placed  . TONSILLECTOMY    . VASECTOMY  1983   Social History   Occupational History  . Not on file  Tobacco Use  . Smoking status: Never Smoker  . Smokeless tobacco: Never Used  Vaping Use  . Vaping Use: Never used  Substance and Sexual Activity  . Alcohol use: Yes    Alcohol/week: 9.0 standard drinks    Types: 5 Glasses of wine, 4 Cans of beer per week  . Drug use: No  . Sexual activity: Yes    Birth control/protection: Surgical    Comment: vasectomy

## 2020-11-13 NOTE — Patient Instructions (Signed)
Plan: Order an updated MRI as the last MRI was done 09/23/2019 and is old Avoid prolong standing and walking. Order for a new walker with wheels. Surgery scheduling secretary Tivis Ringer, will call you in the next week to schedule for surgery.  Surgery recommended is a one level lumbar fusion L4-5 this would be done with rods, screws and cages with local bone graft and allograft (donor bone graft) and vivigen. Take hydrocodone for for pain. Risk of surgery includes risk of infection 1 in 200 patients, bleeding 1/2% chance you would need a transfusion.   Risk to the nerves is one in 10,000. You will need to use a brace for 3 months and wean from the brace on the 4th month. Expect improved walking and standing tolerance. Expect relief of leg pain but numbness may persist depending on the length and degree of pressure that has been present.

## 2020-11-28 NOTE — Progress Notes (Signed)
Cardiology Office Note   Date:  12/07/2020   ID:  Patrick Wall, DOB [birth year], MRN [id number]  PCP:  Donita Brooks, MD  Cardiologist:   Charlton Haws, MD   No chief complaint on file.     History of Present Illness: Patrick Wall is a 70 y.o. male first seen March 2021  regarding CAD. Referred by Dr Tanya Nones Reviewed His office note from June 29, 2018. Paternal aunt died at 77 of MI. Father died suddenly age 8's after snorkeling Patient Himself is active competing in triathlons.  No cardiac symptoms Primary noted a soft systolic murmur. Currently Taking statin. Had CXR 01/29/18 for cough NAD. ECG in 2014 was normal bradycardia rate 45  LDL 105 on labs checked  06/22/18 Lipitor dose increased to 40 mg daily  He does triathlons when he runs first mile which is hard then feels better Had cath in 2007 for abnormal holter ? 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   Married works in lab. 3 children 2 in [place] and one in [place]  F/U studies reviewed  Calcium score 09/02/18 424 Echo mild MR normal EF PFO Myovue 09/17/18 normal no ischemia EF 59%  Seeing Dr Prince Rome for back pain  MRI suggested spondylosis with central canal stenosis L4-5  Had L45 fusion with Dr Otelia Sergeant on 08/20/20   Active but limited by back Did his [facility] last March 2021  Has no palpitations, chest pain or dyspnea Going to [place] in June for wife To compete She has a new job and has to be in [place] during week    Past Medical History:  Diagnosis Date  . GERD (gastroesophageal reflux disease)    tums  . Hearing loss   . History of kidney stones   . Hyperlipidemia   . Labral tear of shoulder   . Lumbar spinal stenosis     Past Surgical History:  Procedure Laterality Date  . COLONOSCOPY    . EYE SURGERY  2000   lasic  . MOUTH SURGERY     teeth removed, dental implants placed  . TONSILLECTOMY    . VASECTOMY  1983      Current Outpatient Medications  Medication Sig Dispense Refill  . aspirin EC 81 MG tablet Take 81 mg by mouth daily.    Marland Kitchen atorvastatin (LIPITOR) 40 MG tablet Take 1 tablet (40 mg total) by mouth daily. 90 tablet 3  . Cholecalciferol (VITAMIN D-3) 125 MCG (5000 UT) TABS Take 5,000 Units by mouth 3 (three) times a week.    . Multiple Vitamin (MULTIVITAMIN) capsule Take 1 capsule by mouth daily.     No current facility-administered medications for this visit.    Allergies:   Sulfa antibiotics    Social History:  The patient  reports that he has never smoked. He has never used smokeless tobacco. He reports current alcohol use of about 9.0 standard drinks of alcohol per week. He reports that he does not use drugs.   Family History:  The patient's family history includes Arthritis in his maternal grandmother; Cancer in his maternal grandfather, maternal grandmother, and mother; Early death in his father; Heart disease in his mother and paternal grandfather; Hyperlipidemia in his maternal grandmother; Hypertension in his maternal grandmother; Stroke in his paternal grandfather; Vision loss in his mother.    ROS:  Please see the history of present illness.   Otherwise, review of systems are  positive for none.   All other systems are reviewed and negative.    PHYSICAL EXAM: VS:  Ht 6\' 3"  (1.905 m)   BMI 25.37 kg/m  , BMI Body mass index is 25.37 kg/m. Affect appropriate Healthy:  appears stated age HEENT: normal Neck supple with no adenopathy JVP normal bilateral bruits no thyromegaly Lungs clear with no wheezing and good diaphragmatic motion Heart:  S1/S2 apical MR  murmur radiates to carotids , no rub, gallop or click PMI normal Abdomen: benighn, BS positve, no tenderness, no AAA no bruit.  No HSM or HJR Distal pulses intact with no bruits No edema Neuro non-focal Skin warm and dry No muscular weakness    EKG:  2014 SB rate 45 normal 08/27/18 SR rate 51 normal voltage for  LVH SR rate 45 normal  12/07/2020 SR Rate 49 normal voltage for LVH   Recent Labs: 08/17/2020: ALT 28 08/21/2020: BUN 13; Creatinine, Ser 1.09; Hemoglobin 11.9; Platelets 137; Potassium 3.9; Sodium 136    Lipid Panel    Component Value Date/Time   CHOL 180 04/10/2020 1044   TRIG 58 04/10/2020 1044   HDL 95 04/10/2020 1044   CHOLHDL 1.9 04/10/2020 1044   VLDL 10 10/15/2016 0832   LDLCALC 72 04/10/2020 1044      Wt Readings from Last 3 Encounters:  11/12/20 92.1 kg  10/01/20 92.1 kg  09/03/20 92.1 kg      Other studies Reviewed: Additional studies/ records that were reviewed today include: notes from primary labs CXR and ECG 2014. Myovue 09/17/18 , echo 09/02/18 calcium score 09/02/18    ASSESSMENT AND PLAN:  1.  CAD Risk:  Calcium score 424 09/13/18 this is 78 th percentile Myovue normal 09/17/18 continue risk factor modification consider CT with FFR next year  2. HLD:  Statin  Higher dose LDL at goal 72 04/10/20  3. Bradycardia:  Functional due to training effect normal ECG 4. Murmur:  Mild MR on TTE done 09/02/18  It is fairly loud but asymptomatic update echo 5. PFO : not clinically significant observe  6. Back pain:  08/20/20 post fusion L45 f/u Dr 08/22/20  7. Bruits:  He has bilateral bruits will order duplex      Current medicines are reviewed at length with the patient today.  The patient does not have concerns regarding medicines.  The following changes have been made:  no change  Labs/ tests ordered today include:  Echo for PFO with bubble and MR/murmur  Carotid duplex for bruit   No orders of the defined types were placed in this encounter.    Disposition:   FU with cardiology in 6 months  if echo and carotid  stable     Signed, Otelia Sergeant, MD  12/07/2020 9:42 AM    [facility] [address] [place], [place], [place]  [place] Phone: [phone]; Fax: [phone]

## 2020-12-07 ENCOUNTER — Ambulatory Visit: Payer: BC Managed Care – PPO | Admitting: Cardiovascular Disease

## 2020-12-07 ENCOUNTER — Encounter: Payer: Self-pay | Admitting: Cardiovascular Disease

## 2020-12-07 ENCOUNTER — Other Ambulatory Visit: Payer: Self-pay

## 2020-12-07 VITALS — BP 110/86 | HR 49 | Ht 75.0 in | Wt 196.0 lb

## 2020-12-07 DIAGNOSIS — E782 Mixed hyperlipidemia: Secondary | ICD-10-CM | POA: Diagnosis not present

## 2020-12-07 DIAGNOSIS — I2510 Atherosclerotic heart disease of native coronary artery without angina pectoris: Secondary | ICD-10-CM

## 2020-12-07 DIAGNOSIS — R0989 Other specified symptoms and signs involving the circulatory and respiratory systems: Secondary | ICD-10-CM | POA: Diagnosis not present

## 2020-12-07 DIAGNOSIS — R011 Cardiac murmur, unspecified: Secondary | ICD-10-CM | POA: Diagnosis not present

## 2020-12-07 NOTE — Patient Instructions (Signed)
Medication Instructions:  *If you need a refill on your cardiac medications before your next appointment, please call your pharmacy*  Lab Work: If you have labs (blood work) drawn today and your tests are completely normal, you will receive your results only by: Marland Kitchen MyChart Message (if you have MyChart) OR . A paper copy in the mail If you have any lab test that is abnormal or we need to change your treatment, we will call you to review the results.  Testing/Procedures: Your physician has requested that you have an echocardiogram. Echocardiography is a painless test that uses sound waves to create images of your heart. It provides your doctor with information about the size and shape of your heart and how well your heart's chambers and valves are working. This procedure takes approximately one hour. There are no restrictions for this procedure.  Your physician has requested that you have a carotid duplex. This test is an ultrasound of the carotid arteries in your neck. It looks at blood flow through these arteries that supply the brain with blood. Allow one hour for this exam. There are no restrictions or special instructions.  Follow-Up: At [facility], you and your health needs are our priority.  As part of our continuing mission to provide you with exceptional heart care, we have created designated Provider Care Teams.  These Care Teams include your primary Cardiologist (physician) and Advanced Practice Providers (APPs -  Physician Assistants and Nurse Practitioners) who all work together to provide you with the care you need, when you need it.  We recommend signing up for the patient portal called "MyChart".  Sign up information is provided on this After Visit Summary.  MyChart is used to connect with patients for Virtual Visits (Telemedicine).  Patients are able to view lab/test results, encounter notes, upcoming appointments, etc.  Non-urgent messages can be sent to your provider as well.    To learn more about what you can do with MyChart, go to [email or web address].    Your next appointment:   6 month(s)  The format for your next appointment:   In Person  Provider:   You may see Dr. Eden Emms or one of the following Advanced Practice Providers on your designated Care Team:    Georgie Chard, NP

## 2020-12-10 DIAGNOSIS — H35411 Lattice degeneration of retina, right eye: Secondary | ICD-10-CM | POA: Diagnosis not present

## 2020-12-10 DIAGNOSIS — H25041 Posterior subcapsular polar age-related cataract, right eye: Secondary | ICD-10-CM | POA: Diagnosis not present

## 2020-12-10 DIAGNOSIS — H25013 Cortical age-related cataract, bilateral: Secondary | ICD-10-CM | POA: Diagnosis not present

## 2020-12-10 DIAGNOSIS — H35373 Puckering of macula, bilateral: Secondary | ICD-10-CM | POA: Diagnosis not present

## 2021-01-08 ENCOUNTER — Ambulatory Visit (HOSPITAL_COMMUNITY)
Admission: RE | Admit: 2021-01-08 | Discharge: 2021-01-08 | Disposition: A | Discharge status: Home / Self Care | Payer: BC Managed Care – PPO | Source: Ambulatory Visit | Attending: Internal Medicine | Admitting: Internal Medicine

## 2021-01-08 ENCOUNTER — Ambulatory Visit (HOSPITAL_BASED_OUTPATIENT_CLINIC_OR_DEPARTMENT_OTHER): Payer: BC Managed Care – PPO

## 2021-01-08 ENCOUNTER — Other Ambulatory Visit: Payer: Self-pay

## 2021-01-08 DIAGNOSIS — R0989 Other specified symptoms and signs involving the circulatory and respiratory systems: Secondary | ICD-10-CM | POA: Diagnosis not present

## 2021-01-08 DIAGNOSIS — R011 Cardiac murmur, unspecified: Secondary | ICD-10-CM

## 2021-01-08 LAB — ECHOCARDIOGRAM COMPLETE BUBBLE STUDY
AR max vel: 1.72 cm2
AV Area VTI: 1.85 cm2
AV Area mean vel: 1.81 cm2
AV Mean grad: 18.3 mmHg
AV Peak grad: 34.1 mmHg
Ao pk vel: 2.92 m/s
Area-P 1/2: 3.37 cm2
S' Lateral: 3.3 cm

## 2021-01-08 MED ORDER — SODIUM CHLORIDE 0.9% FLUSH
10.0000 mL | INTRAVENOUS | Status: AC | PRN
  Administered 2021-01-08: 20 mL via INTRAVENOUS

## 2021-01-09 ENCOUNTER — Telehealth: Payer: Self-pay

## 2021-01-09 DIAGNOSIS — I350 Nonrheumatic aortic (valve) stenosis: Secondary | ICD-10-CM

## 2021-01-09 NOTE — Telephone Encounter (Signed)
-----   Message from Wendall Stade, MD sent at 01/09/2021  8:00 AM EDT ----- EF normal small PFO known has developed aortic stenosis moderate f/u echo in 6 months

## 2021-01-09 NOTE — Telephone Encounter (Signed)
Reviewed results with patient who verbalized understanding.   Scheduled the patient for echo 07/15/21. He was grateful for call and agrees with plan.

## 2021-01-16 ENCOUNTER — Ambulatory Visit: Payer: BC Managed Care – PPO | Admitting: Specialist

## 2021-01-16 ENCOUNTER — Other Ambulatory Visit: Payer: Self-pay

## 2021-01-16 ENCOUNTER — Ambulatory Visit (INDEPENDENT_AMBULATORY_CARE_PROVIDER_SITE_OTHER): Payer: BC Managed Care – PPO

## 2021-01-16 ENCOUNTER — Encounter: Payer: Self-pay | Admitting: Specialist

## 2021-01-16 VITALS — BP 117/75 | HR 55 | Ht 75.0 in | Wt 196.0 lb

## 2021-01-16 DIAGNOSIS — M4316 Spondylolisthesis, lumbar region: Secondary | ICD-10-CM

## 2021-01-16 DIAGNOSIS — Z981 Arthrodesis status: Secondary | ICD-10-CM | POA: Diagnosis not present

## 2021-01-16 DIAGNOSIS — M4807 Spinal stenosis, lumbosacral region: Secondary | ICD-10-CM

## 2021-01-16 NOTE — Patient Instructions (Signed)
Plan: Avoid frequent bending and stooping  No lifting greater than 25-30 lbs. May use ice or moist heat for pain. Weight loss is of benefit. Best medication for lumbar disc disease is arthritis medications like motrin, celebrex and naprosyn. Exercise is important to improve your indurance and does allow people to function better inspite of back pain.

## 2021-01-16 NOTE — Progress Notes (Signed)
Office Visit Note   Patient: Patrick Wall           Date of Birth: 03/03/1951           MRN: [id number] Visit Date: 01/16/2021              Requested by: Donita Brooks, MD [address] [facility] [address] [place],  [place] [address] PCP: Donita Brooks, MD   Assessment & Plan: Visit Diagnoses:  1. Spondylolisthesis of lumbar region   2. S/P lumbar spinal fusion   3. Spinal stenosis of lumbosacral region     Plan: Avoid frequent bending and stooping  No lifting greater than 25-30 lbs. May use ice or moist heat for pain. Weight loss is of benefit. Best medication for lumbar disc disease is arthritis medications like motrin, celebrex and naprosyn. Exercise is important to improve your indurance and does allow people to function better inspite of back pain.    Follow-Up Instructions: No follow-ups on file.   Orders:  Orders Placed This Encounter  Procedures  . XR Lumbar Spine Complete W/Bend   No orders of the defined types were placed in this encounter.     Procedures: No procedures performed   Clinical Data: No additional findings.   Subjective: Chief Complaint  Patient presents with  . Lower Back - Follow-up    70 year old male nearly 5 months following decompression and fusion L4-5 for spinal stenosis and spondylolisthesis. He is walking daily and wants to get started in a pool exercise program. No bowel or bladder  Difficulties. He is planning a trip to [place] for attending wife's biathalon in [place], but first they are planning to visit [place] and take in [place]. He is concerned about the fusion and wishes to be sure the fusion is healing  Well.He is not taking any narcotics or medications for pain or leg discomfort.    Review of Systems  Constitutional: Negative.   HENT: Negative.   Eyes: Negative.   Respiratory: Negative.   Cardiovascular: Negative.   Gastrointestinal: Negative.   Endocrine: Negative.   Genitourinary: Negative.   Musculoskeletal:  Negative.   Skin: Negative.   Allergic/Immunologic: Negative.   Neurological: Negative.   Hematological: Negative.   Psychiatric/Behavioral: Negative.      Objective: Vital Signs: BP 117/75   Pulse (!) 55   Ht 6\' 3"  (1.905 m)   Wt 196 lb (88.9 kg)   BMI 24.50 kg/m   Physical Exam Constitutional:      Appearance: He is well-developed.  HENT:     Head: Normocephalic and atraumatic.  Eyes:     Pupils: Pupils are equal, round, and reactive to light.  Pulmonary:     Effort: Pulmonary effort is normal.     Breath sounds: Normal breath sounds.  Abdominal:     General: Bowel sounds are normal.     Palpations: Abdomen is soft.  Musculoskeletal:        General: Normal range of motion.     Cervical back: Normal range of motion and neck supple.     Lumbar back: Negative right straight leg raise test and negative left straight leg raise test.  Skin:    General: Skin is warm and dry.  Neurological:     Mental Status: He is alert and oriented to person, place, and time.  Psychiatric:        Behavior: Behavior normal.        Thought Content: Thought content normal.  Judgment: Judgment normal.     Back Exam   Tenderness  The patient is experiencing tenderness in the lumbar.  Muscle Strength  Right Quadriceps:  5/5  Left Quadriceps:  5/5  Right Hamstrings:  5/5  Left Hamstrings:  5/5   Tests  Straight leg raise right: negative Straight leg raise left: negative  Reflexes  Patellar: 0/4 Achilles: 0/4  Other  Toe walk: normal Heel walk: normal Sensation: normal Gait: normal  Erythema: no back redness Scars: present  Comments:  Motor is normal bilaterally.       Specialty Comments:  No specialty comments available.  Imaging: No results found.   PMFS History: Patient Active Problem List   Diagnosis Date Noted  . Fusion of spine of thoracolumbar region 08/20/2020  . Spondylolisthesis, lumbar region   . Lumbar spinal stenosis   . Bilateral knee  pain 06/03/2016  . Abnormality of gait 06/03/2016  . Left foot pain 03/18/2016  . Labral tear of shoulder   . Hyperlipidemia   . Hypertension   . Hearing loss   . 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 GERD (gastroesophageal reflux disease)    tums  . Hearing loss   . History of kidney stones   . Hyperlipidemia   . Labral tear of shoulder   . Lumbar spinal stenosis     Family History  Problem Relation Age of Onset  . Cancer Mother        skin  . Heart disease Mother   . Vision loss Mother   . Early death Father   . Arthritis Maternal Grandmother   . Cancer Maternal Grandmother   . Hyperlipidemia Maternal Grandmother   . Hypertension Maternal Grandmother   . Cancer Maternal Grandfather   . Heart disease Paternal Grandfather   . Stroke Paternal Grandfather     Past Surgical History:  Procedure Laterality Date  . COLONOSCOPY    . EYE SURGERY  2000   lasic  . MOUTH SURGERY     teeth removed, dental implants placed  . TONSILLECTOMY    . VASECTOMY  1983   Social History   Occupational History  . Not on file  Tobacco Use  . Smoking status: Never Smoker  . Smokeless tobacco: Never Used  Vaping Use  . Vaping Use: Never used  Substance and Sexual Activity  . Alcohol use: Yes    Alcohol/week: 9.0 standard drinks    Types: 5 Glasses of wine, 4 Cans of beer per week  . Drug use: No  . Sexual activity: Yes    Birth control/protection: Surgical    Comment: vasectomy

## 2021-02-14 ENCOUNTER — Encounter: Payer: Self-pay | Admitting: Family Medicine

## 2021-02-14 NOTE — Progress Notes (Unsigned)
Subjective:   Patrick Wall is a 70 y.o. male who presents for Medicare Annual/Subsequent preventive examination.  Review of Systems    N/A        Objective:    There were no vitals filed for this visit. There is no height or weight on file to calculate BMI.  Advanced Directives 08/17/2020 09/23/2016 08/12/2016 06/03/2016 03/25/2016 02/20/2016 10/16/2015  Does Patient Have a Medical Advance Directive? No No No No No No No  Would patient like information on creating a medical advance directive? No - Patient declined - - No - patient declined information No - patient declined information No - patient declined information No - patient declined information    Current Medications (verified) Outpatient Encounter Medications as of 02/15/2021  Medication Sig   aspirin EC 81 MG tablet Take 81 mg by mouth daily.   atorvastatin (LIPITOR) 40 MG tablet Take 1 tablet (40 mg total) by mouth daily.   Cholecalciferol (VITAMIN D-3) 125 MCG (5000 UT) TABS Take 5,000 Units by mouth 3 (three) times a week.   Multiple Vitamin (MULTIVITAMIN) capsule Take 1 capsule by mouth daily.   Facility-Administered Encounter Medications as of 02/15/2021  Medication   sodium chloride flush (NS) 0.9 % injection 10 mL    Allergies (verified) Sulfa antibiotics   History: Past Medical History:  Diagnosis Date   GERD (gastroesophageal reflux disease)    tums   Hearing loss    History of kidney stones    Hyperlipidemia    Labral tear of shoulder    Lumbar spinal stenosis    Past Surgical History:  Procedure Laterality Date   COLONOSCOPY     EYE SURGERY  2000   lasic   MOUTH SURGERY     teeth removed, dental implants placed   TONSILLECTOMY     VASECTOMY  1983   Family History  Problem Relation Age of Onset   Cancer Mother        skin   Heart disease Mother    Vision loss Mother    Early death Father    Arthritis Maternal Grandmother    Cancer Maternal Grandmother    Hyperlipidemia Maternal  Grandmother    Hypertension Maternal Grandmother    Cancer Maternal Grandfather    Heart disease Paternal Grandfather    Stroke Paternal Grandfather    Social History   Socioeconomic History   Marital status: Married    Spouse name: Not on file   Number of children: Not on file   Years of education: Not on file   Highest education level: Not on file  Occupational History   Not on file  Tobacco Use   Smoking status: Never   Smokeless tobacco: Never  Vaping Use   Vaping Use: Never used  Substance and Sexual Activity   Alcohol use: Yes    Alcohol/week: 9.0 standard drinks    Types: 5 Glasses of wine, 4 Cans of beer per week   Drug use: No   Sexual activity: Yes    Birth control/protection: Surgical    Comment: vasectomy  Other Topics Concern   Not on file  Social History Narrative   Not on file   Social Determinants of Health   Financial Resource Strain: Not on file  Food Insecurity: Not on file  Transportation Needs: Not on file  Physical Activity: Not on file  Stress: Not on file  Social Connections: Not on file    Tobacco Counseling Counseling given: Not Answered   Clinical  Intake:                 Diabetic?No          Activities of Daily Living In your present state of health, do you have any difficulty performing the following activities: 08/17/2020  Hearing? Y  Comment hearing loss, does not wear hearing aids  Vision? N  Difficulty concentrating or making decisions? N  Walking or climbing stairs? N  Dressing or bathing? N  Doing errands, shopping? N  Some recent data might be hidden    Patient Care Team: Donita Brooks, MD as PCP - General (Family Medicine)  Indicate any recent Medical Services you may have received from other than [facility] providers in the past year (date may be approximate).     Assessment:   This is a routine wellness examination for Patrick Wall.  Hearing/Vision screen No results found.  Dietary issues and  exercise activities discussed:     Goals Addressed   None    Depression Screen PHQ 2/9 Scores 04/30/2020 05/24/2019 07/09/2017 07/09/2017 11/13/2016 10/28/2016 09/23/2016  PHQ - 2 Score 0 0 0 0 0 0 0  PHQ- 9 Score - - - - 0 - -  Exception Documentation - - - - - Other- indicate reason in comment box Other- indicate reason in comment box    Fall Risk Fall Risk  04/30/2020 03/14/2020 05/24/2019 07/09/2017 07/09/2017  Falls in the past year? 0 0 0 Yes No  Number falls in past yr: 0 0 0 1 -  Injury with Fall? 0 0 0 Yes -  Risk for fall due to : - No Fall Risks - - -  Follow up - Falls evaluation completed - Falls evaluation completed -    FALL RISK PREVENTION PERTAINING TO THE HOME:  Any stairs in or around the home? {YES/NO:21197} If so, are there any without handrails? No  Home free of loose throw rugs in walkways, pet beds, electrical cords, etc? Yes  Adequate lighting in your home to reduce risk of falls? Yes   ASSISTIVE DEVICES UTILIZED TO PREVENT FALLS:  Life alert? {YES/NO:21197} Use of a cane, walker or w/c? {YES/NO:21197} Grab bars in the bathroom? {YES/NO:21197} Shower chair or bench in shower? {YES/NO:21197} Elevated toilet seat or a handicapped toilet? {YES/NO:21197}  TIMED UP AND GO:  Was the test performed? Yes .  Length of time to ambulate 10 feet:  sec.   {Appearance of [id number]  Cognitive Function:        Immunizations Immunization History  Administered Date(s) Administered   Influenza Split 05/21/2015   Influenza-Unspecified 06/02/2016, 05/18/2017   PFIZER(Purple Top)SARS-COV-2 Vaccination 09/03/2019, 09/23/2019   Pneumococcal Conjugate-13 07/09/2017   Pneumococcal Polysaccharide-23 09/19/2014, 04/30/2020   Tdap 04/01/2014   Zoster, Live 03/07/2014    TDAP status: Up to date  Flu Vaccine status: Up to date  Pneumococcal vaccine status: Up to date  Covid-19 vaccine status: Completed vaccines  Qualifies for Shingles Vaccine? Yes   Zostavax  completed Yes   Shingrix Completed?: No.    Education has been provided regarding the importance of this vaccine. Patient has been advised to call insurance company to determine out of pocket expense if they have not yet received this vaccine. Advised may also receive vaccine at local pharmacy or Health Dept. Verbalized acceptance and understanding.  Screening Tests Health Maintenance  Topic Date Due   Zoster Vaccines- Shingrix (1 of 2) Never done   INFLUENZA VACCINE  04/01/2021   COLONOSCOPY (Pts 45-32yrs Insurance coverage  will need to be confirmed)  11/28/2022   TETANUS/TDAP  04/01/2024   COVID-19 Vaccine  Completed   Hepatitis C Screening  Completed   PNA vac Low Risk Adult  Completed   HPV VACCINES  Aged Out    Health Maintenance  Health Maintenance Due  Topic Date Due   Zoster Vaccines- Shingrix (1 of 2) Never done    Colorectal cancer screening: Type of screening: Colonoscopy. Completed 11/27/2012. Repeat every 10 years  Lung Cancer Screening: (Low Dose CT Chest recommended if Age 35-80 years, 30 pack-year currently smoking OR have quit w/in 15years.) does not qualify.   Lung Cancer Screening Referral: N/A   Additional Screening:  Hepatitis C Screening: does qualify; Completed 07/06/2017  Vision Screening: Recommended annual ophthalmology exams for early detection of glaucoma and other disorders of the eye. Is the patient up to date with their annual eye exam?  {YES/NO:21197} Who is the provider or what is the name of the office in which the patient attends annual eye exams? *** If pt is not established with a provider, would they like to be referred to a provider to establish care? {YES/NO:21197}.   Dental Screening: Recommended annual dental exams for proper oral hygiene  Community Resource Referral / Chronic Care Management: CRR required this visit?  No   CCM required this visit?  No      Plan:     I have personally reviewed and noted the following in the  patient's chart:   Medical and social history Use of alcohol, tobacco or illicit drugs  Current medications and supplements including opioid prescriptions. Patient is not currently taking opioid prescriptions. Functional ability and status Nutritional status Physical activity Advanced directives List of other physicians Hospitalizations, surgeries, and ER visits in previous 12 months Vitals Screenings to include cognitive, depression, and falls Referrals and appointments  In addition, I have reviewed and discussed with patient certain preventive protocols, quality metrics, and best practice recommendations. A written personalized care plan for preventive services as well as general preventive health recommendations were provided to patient.     Theodora Blow, LPN   04/10/1750   Nurse Notes: None

## 2021-02-15 ENCOUNTER — Ambulatory Visit: Payer: BC Managed Care – PPO

## 2021-02-15 ENCOUNTER — Ambulatory Visit (INDEPENDENT_AMBULATORY_CARE_PROVIDER_SITE_OTHER): Payer: BC Managed Care – PPO

## 2021-02-15 ENCOUNTER — Other Ambulatory Visit: Payer: Self-pay

## 2021-02-15 DIAGNOSIS — Z0000 Encounter for general adult medical examination without abnormal findings: Secondary | ICD-10-CM | POA: Diagnosis not present

## 2021-02-15 NOTE — Patient Instructions (Addendum)
[name] , Thank you for taking time to come for your Medicare Wellness Visit. I appreciate your ongoing commitment to your health goals. Please review the following plan we discussed and let me know if I can assist you in the future.   Screening recommendations/referrals: Colonoscopy: Up to date, next due 11/28/2022 Recommended yearly ophthalmology/optometry visit for glaucoma screening and checkup Recommended yearly dental visit for hygiene and checkup  Vaccinations: Influenza vaccine: Up to date, next due fall 2022  Pneumococcal vaccine: Completed series Tdap vaccine: Up to date, next due 04/01/2024 Shingles vaccine: You received the older shingles vaccine called Zostavax, there is now a newer shingles vaccine available called Shingrix. You can get this vaccine if you like, if you would like to we recommend that you do so at your local pharmacy    Advanced directives: Advance directive discussed with you today. Even though you declined this today please call our office should you change your mind and we can give you the proper paperwork for you to fill out.   Conditions/risks identified: None   Next appointment: None   Preventive Care 65 Years and Older, Male Preventive care refers to lifestyle choices and visits with your health care provider that can promote health and wellness. What does preventive care include? A yearly physical exam. This is also called an annual well check. Dental exams once or twice a year. Routine eye exams. Ask your health care provider how often you should have your eyes checked. Personal lifestyle choices, including: Daily care of your teeth and gums. Regular physical activity. Eating a healthy diet. Avoiding tobacco and drug use. Limiting alcohol use. Practicing safe sex. Taking low doses of aspirin every day. Taking vitamin and mineral supplements as recommended by your health care provider. What happens during an annual well check? The services  and screenings done by your health care provider during your annual well check will depend on your age, overall health, lifestyle risk factors, and family history of disease. Counseling  Your health care provider may ask you questions about your: Alcohol use. Tobacco use. Drug use. Emotional well-being. Home and relationship well-being. Sexual activity. Eating habits. History of falls. Memory and ability to understand (cognition). Work and work Astronomer. Screening  You may have the following tests or measurements: Height, weight, and BMI. Blood pressure. Lipid and cholesterol levels. These may be checked every 5 years, or more frequently if you are over 51 years old. Skin check. Lung cancer screening. You may have this screening every year starting at age 32 if you have a 30-pack-year history of smoking and currently smoke or have quit within the past 15 years. Fecal occult blood test (FOBT) of the stool. You may have this test every year starting at age 54. Flexible sigmoidoscopy or colonoscopy. You may have a sigmoidoscopy every 5 years or a colonoscopy every 10 years starting at age 85. Prostate cancer screening. Recommendations will vary depending on your family history and other risks. Hepatitis C blood test. Hepatitis B blood test. Sexually transmitted disease (STD) testing. Diabetes screening. This is done by checking your blood sugar (glucose) after you have not eaten for a while (fasting). You may have this done every 1-3 years. Abdominal aortic aneurysm (AAA) screening. You may need this if you are a current or former smoker. Osteoporosis. You may be screened starting at age 74 if you are at high risk. Talk with your health care provider about your test results, treatment options, and if necessary, the need for  more tests. Vaccines  Your health care provider may recommend certain vaccines, such as: Influenza vaccine. This is recommended every year. Tetanus, diphtheria,  and acellular pertussis (Tdap, Td) vaccine. You may need a Td booster every 10 years. Zoster vaccine. You may need this after age 86. Pneumococcal 13-valent conjugate (PCV13) vaccine. One dose is recommended after age 22. Pneumococcal polysaccharide (PPSV23) vaccine. One dose is recommended after age 44. Talk to your health care provider about which screenings and vaccines you need and how often you need them. This information is not intended to replace advice given to you by your health care provider. Make sure you discuss any questions you have with your health care provider. Document Released: 09/14/2015 Document Revised: 05/07/2016 Document Reviewed: 06/19/2015 Elsevier Interactive Patient Education  2017 [place] Prevention in the Home Falls can cause injuries. They can happen to people of all ages. There are many things you can do to make your home safe and to help prevent falls. What can I do on the outside of my home? Regularly fix the edges of walkways and driveways and fix any cracks. Remove anything that might make you trip as you walk through a door, such as a raised step or threshold. Trim any bushes or trees on the path to your home. Use bright outdoor lighting. Clear any walking paths of anything that might make someone trip, such as rocks or tools. Regularly check to see if handrails are loose or broken. Make sure that both sides of any steps have handrails. Any raised decks and porches should have guardrails on the edges. Have any leaves, snow, or ice cleared regularly. Use sand or salt on walking paths during winter. Clean up any spills in your garage right away. This includes oil or grease spills. What can I do in the bathroom? Use night lights. Install grab bars by the toilet and in the tub and shower. Do not use towel bars as grab bars. Use non-skid mats or decals in the tub or shower. If you need to sit down in the shower, use a plastic, non-slip  stool. Keep the floor dry. Clean up any water that spills on the floor as soon as it happens. Remove soap buildup in the tub or shower regularly. Attach bath mats securely with double-sided non-slip rug tape. Do not have throw rugs and other things on the floor that can make you trip. What can I do in the bedroom? Use night lights. Make sure that you have a light by your bed that is easy to reach. Do not use any sheets or blankets that are too big for your bed. They should not hang down onto the floor. Have a firm chair that has side arms. You can use this for support while you get dressed. Do not have throw rugs and other things on the floor that can make you trip. What can I do in the kitchen? Clean up any spills right away. Avoid walking on wet floors. Keep items that you use a lot in easy-to-reach places. If you need to reach something above you, use a strong step stool that has a grab bar. Keep electrical cords out of the way. Do not use floor polish or wax that makes floors slippery. If you must use wax, use non-skid floor wax. Do not have throw rugs and other things on the floor that can make you trip. What can I do with my stairs? Do not leave any items on the stairs. Make  sure that there are handrails on both sides of the stairs and use them. Fix handrails that are broken or loose. Make sure that handrails are as long as the stairways. Check any carpeting to make sure that it is firmly attached to the stairs. Fix any carpet that is loose or worn. Avoid having throw rugs at the top or bottom of the stairs. If you do have throw rugs, attach them to the floor with carpet tape. Make sure that you have a light switch at the top of the stairs and the bottom of the stairs. If you do not have them, ask someone to add them for you. What else can I do to help prevent falls? Wear shoes that: Do not have high heels. Have rubber bottoms. Are comfortable and fit you well. Are closed at the  toe. Do not wear sandals. If you use a stepladder: Make sure that it is fully opened. Do not climb a closed stepladder. Make sure that both sides of the stepladder are locked into place. Ask someone to hold it for you, if possible. Clearly mark and make sure that you can see: Any grab bars or handrails. First and last steps. Where the edge of each step is. Use tools that help you move around (mobility aids) if they are needed. These include: Canes. Walkers. Scooters. Crutches. Turn on the lights when you go into a dark area. Replace any light bulbs as soon as they burn out. Set up your furniture so you have a clear path. Avoid moving your furniture around. If any of your floors are uneven, fix them. If there are any pets around you, be aware of where they are. Review your medicines with your doctor. Some medicines can make you feel dizzy. This can increase your chance of falling. Ask your doctor what other things that you can do to help prevent falls. This information is not intended to replace advice given to you by your health care provider. Make sure you discuss any questions you have with your health care provider. Document Released: 06/14/2009 Document Revised: 01/24/2016 Document Reviewed: 09/22/2014 Elsevier Interactive Patient Education  2017 [employer].

## 2021-02-15 NOTE — Progress Notes (Signed)
Subjective:   Patrick Wall is a 70 y.o. male who presents for Medicare Annual/Subsequent preventive examination.  I connected with Patrick Wall today by telephone and verified that I am speaking with the correct person using two identifiers. Location patient: home Location provider: work Persons participating in the virtual visit: patient, provider.   I discussed the limitations, risks, security and privacy concerns of performing an evaluation and management service by telephone and the availability of in person appointments. I also discussed with the patient that there may be a patient responsible charge related to this service. The patient expressed understanding and verbally consented to this telephonic visit.    Interactive audio and video telecommunications were attempted between this provider and patient, however failed, due to patient having technical difficulties OR patient did not have access to video capability.  We continued and completed visit with audio only.     Review of Systems    N/A  Cardiac Risk Factors include: advanced age (>40men, >2 women);male gender;dyslipidemia     Objective:    Today's Vitals   There is no height or weight on file to calculate BMI.  Advanced Directives 02/15/2021 08/17/2020 09/23/2016 08/12/2016 06/03/2016 03/25/2016 02/20/2016  Does Patient Have a Medical Advance Directive? No No No No No No No  Would patient like information on creating a medical advance directive? No - Patient declined No - Patient declined - - No - patient declined information No - patient declined information No - patient declined information    Current Medications (verified) Outpatient Encounter Medications as of 02/15/2021  Medication Sig   aspirin EC 81 MG tablet Take 81 mg by mouth daily.   atorvastatin (LIPITOR) 40 MG tablet Take 1 tablet (40 mg total) by mouth daily.   Cholecalciferol (VITAMIN D-3) 125 MCG (5000 UT) TABS Take 5,000 Units by mouth 3 (three) times  a week.   Multiple Vitamin (MULTIVITAMIN) capsule Take 1 capsule by mouth daily.   Facility-Administered Encounter Medications as of 02/15/2021  Medication   sodium chloride flush (NS) 0.9 % injection 10 mL    Allergies (verified) Sulfa antibiotics   History: Past Medical History:  Diagnosis Date   GERD (gastroesophageal reflux disease)    tums   Hearing loss    History of kidney stones    Hyperlipidemia    Labral tear of shoulder    Lumbar spinal stenosis    Past Surgical History:  Procedure Laterality Date   BACK SURGERY  08/19/2020   COLONOSCOPY     EYE SURGERY  2000   lasic   MOUTH SURGERY     teeth removed, dental implants placed   TONSILLECTOMY     VASECTOMY  1983   Family History  Problem Relation Age of Onset   Cancer Mother        skin   Heart disease Mother    Vision loss Mother    Early death Father    Arthritis Maternal Grandmother    Cancer Maternal Grandmother    Hyperlipidemia Maternal Grandmother    Hypertension Maternal Grandmother    Cancer Maternal Grandfather    Heart disease Paternal Grandfather    Stroke Paternal Grandfather    Social History   Socioeconomic History   Marital status: Married    Spouse name: Not on file   Number of children: Not on file   Years of education: Not on file   Highest education level: Not on file  Occupational History   Not on file  Tobacco  Use   Smoking status: Never   Smokeless tobacco: Never  Vaping Use   Vaping Use: Never used  Substance and Sexual Activity   Alcohol use: Yes    Alcohol/week: 9.0 standard drinks    Types: 5 Glasses of wine, 4 Cans of beer per week   Drug use: No   Sexual activity: Yes    Birth control/protection: Surgical    Comment: vasectomy  Other Topics Concern   Not on file  Social History Narrative   Not on file   Social Determinants of Health   Financial Resource Strain: Low Risk    Difficulty of Paying Living Expenses: Not hard at all  Food Insecurity: No Food  Insecurity   Worried About Programme researcher, broadcasting/film/video in the Last Year: Never true   Ran Out of Food in the Last Year: Never true  Transportation Needs: No Transportation Needs   Lack of Transportation (Medical): No   Lack of Transportation (Non-Medical): No  Physical Activity: Sufficiently Active   Days of Exercise per Week: 7 days   Minutes of Exercise per Session: 60 min  Stress: No Stress Concern Present   Feeling of Stress : Not at all  Social Connections: Moderately Isolated   Frequency of Communication with Friends and Family: Once a week   Frequency of Social Gatherings with Friends and Family: Once a week   Attends Religious Services: Never   Database administrator or Organizations: Yes   Attends Engineer, structural: More than 4 times per year   Marital Status: Married    Tobacco Counseling Counseling given: Not Answered   Clinical Intake:  Pre-visit preparation completed: Yes  Pain : No/denies pain     Nutritional Risks: None Diabetes: No  How often do you need to have someone help you when you read instructions, pamphlets, or other written materials from your doctor or pharmacy?: 1 - Never  Diabetic?No   Interpreter Needed?: No  Information entered by :: SCrews,LPN   Activities of Daily Living In your present state of health, do you have any difficulty performing the following activities: 02/15/2021 08/17/2020  Hearing? N Y  Comment - hearing loss, does not wear hearing aids  Vision? N N  Difficulty concentrating or making decisions? N N  Walking or climbing stairs? N N  Dressing or bathing? N N  Doing errands, shopping? N N  Preparing Food and eating ? N -  Using the Toilet? N -  In the past six months, have you accidently leaked urine? N -  Do you have problems with loss of bowel control? N -  Managing your Medications? N -  Managing your Finances? N -  Housekeeping or managing your Housekeeping? N -  Some recent data might be hidden     Patient Care Team: Donita Brooks, MD as PCP - General (Family Medicine)  Indicate any recent Medical Services you may have received from other than [facility] providers in the past year (date may be approximate).     Assessment:   This is a routine wellness examination for Patrick Wall.  Hearing/Vision screen Vision Screening - Comments:: Patient states gets eye exams once per year. Has a hx of retinal tearing. Wears glasses for reading and driving   Dietary issues and exercise activities discussed: Current Exercise Habits: Home exercise routine, Type of exercise: Other - see comments (swimming, running, bicycling), Time (Minutes): 60, Frequency (Times/Week): 7, Weekly Exercise (Minutes/Week): 420, Intensity: Intense, Exercise limited by: None identified  Goals Addressed             This Visit's Progress    Exercise 150 min/wk Moderate Activity       I plan to run 2 miles per week for 3 days, then increase the amount of days per week by the fall.       Patient Stated       I would like to do a running race in September          Depression Screen PHQ 2/9 Scores 02/15/2021 04/30/2020 05/24/2019 07/09/2017 07/09/2017 11/13/2016 10/28/2016  PHQ - 2 Score 0 0 0 0 0 0 0  PHQ- 9 Score - - - - - 0 -  Exception Documentation - - - - - - Other- indicate reason in comment box    Fall Risk Fall Risk  02/15/2021 04/30/2020 03/14/2020 05/24/2019 07/09/2017  Falls in the past year? 0 0 0 0 Yes  Number falls in past yr: 0 0 0 0 1  Injury with Fall? 0 0 0 0 Yes  Risk for fall due to : No Fall Risks - No Fall Risks - -  Follow up Falls evaluation completed;Falls prevention discussed - Falls evaluation completed - Falls evaluation completed    FALL RISK PREVENTION PERTAINING TO THE HOME:  Any stairs in or around the home? Yes  If so, are there any without handrails? No  Home free of loose throw rugs in walkways, pet beds, electrical cords, etc? Yes  Adequate lighting in your home to reduce risk  of falls? Yes   ASSISTIVE DEVICES UTILIZED TO PREVENT FALLS:  Life alert? No  Use of a cane, walker or w/c? No  Grab bars in the bathroom? No  Shower chair or bench in shower? Yes  Elevated toilet seat or a handicapped toilet? No     Cognitive Function:    Normal cognitive status assessed by direct observation by this Nurse Health Advisor. No abnormalities found.      Immunizations Immunization History  Administered Date(s) Administered   Fluad Quad(high Dose 65+) 07/03/2020   Influenza Split 05/21/2015   Influenza-Unspecified 06/02/2016, 05/18/2017, 05/24/2018, 05/27/2019   PFIZER Comirnaty(Gray Top)Covid-19 Tri-Sucrose Vaccine 01/15/2021   PFIZER(Purple Top)SARS-COV-2 Vaccination 09/03/2019, 09/23/2019, 05/27/2020   Pneumococcal Conjugate-13 07/09/2017   Pneumococcal Polysaccharide-23 09/19/2014, 04/30/2020   Tdap 04/01/2014   Zoster, Live 03/07/2014    TDAP status: Up to date  Flu Vaccine status: Up to date  Pneumococcal vaccine status: Up to date  Covid-19 vaccine status: Completed vaccines  Qualifies for Shingles Vaccine? Yes   Zostavax completed Yes   Shingrix Completed?: No.    Education has been provided regarding the importance of this vaccine. Patient has been advised to call insurance company to determine out of pocket expense if they have not yet received this vaccine. Advised may also receive vaccine at local pharmacy or Health Dept. Verbalized acceptance and understanding.  Screening Tests Health Maintenance  Topic Date Due   Zoster Vaccines- Shingrix (1 of 2) Never done   INFLUENZA VACCINE  04/01/2021   COLONOSCOPY (Pts 45-42yrs Insurance coverage will need to be confirmed)  11/28/2022   TETANUS/TDAP  04/01/2024   COVID-19 Vaccine  Completed   Hepatitis C Screening  Completed   PNA vac Low Risk Adult  Completed   HPV VACCINES  Aged Out    Health Maintenance  Health Maintenance Due  Topic Date Due   Zoster Vaccines- Shingrix (1 of 2) Never  done    Colorectal cancer  screening: Type of screening: Colonoscopy. Completed 11/27/2012. Repeat every 10 years  Lung Cancer Screening: (Low Dose CT Chest recommended if Age 64-80 years, 30 pack-year currently smoking OR have quit w/in 15years.) does not qualify.   Lung Cancer Screening Referral: N/A   Additional Screening:  Hepatitis C Screening: does qualify; Completed 07/06/2017  Vision Screening: Recommended annual ophthalmology exams for early detection of glaucoma and other disorders of the eye. Is the patient up to date with their annual eye exam?  Yes  Who is the provider or what is the name of the office in which the patient attends annual eye exams? Dr. Annice Pihoop If pt is not established with a provider, would they like to be referred to a provider to establish care? No .   Dental Screening: Recommended annual dental exams for proper oral hygiene  Community Resource Referral / Chronic Care Management: CRR required this visit?  No   CCM required this visit?  No      Plan:     I have personally reviewed and noted the following in the patient's chart:   Medical and social history Use of alcohol, tobacco or illicit drugs  Current medications and supplements including opioid prescriptions. Patient is not currently taking opioid prescriptions. Functional ability and status Nutritional status Physical activity Advanced directives List of other physicians Hospitalizations, surgeries, and ER visits in previous 12 months Vitals Screenings to include cognitive, depression, and falls Referrals and appointments  In addition, I have reviewed and discussed with patient certain preventive protocols, quality metrics, and best practice recommendations. A written personalized care plan for preventive services as well as general preventive health recommendations were provided to patient.     Theodora BlowShannon Fae Blossom, LPN   6/29/52846/17/2022   Nurse Notes: None

## 2021-03-18 NOTE — Progress Notes (Signed)
Patient scheduled cpe using MyChart. Appointment type needs more time. Spoke with patient to adjust.

## 2021-04-08 ENCOUNTER — Telehealth: Payer: Self-pay | Admitting: *Deleted

## 2021-04-08 MED ORDER — NIRMATRELVIR/RITONAVIR (PAXLOVID)TABLET: 3.0000 | ORAL_TABLET | Freq: Two times a day (BID) | ORAL | 0 refills | Status: AC

## 2021-04-08 NOTE — Telephone Encounter (Signed)
Patient tested positive for COVID on 04/08/2021.   Sx began 04/05/2021 and include cough, fatigue, malaise, HA, chills.  Advised to continue symptom management with OTC medications: Tylenol/ Ibuprofen for fever/ body aches, Mucinex/ Delsym for cough/ chest congestion, Afrin/Sudafed/nasal saline for sinus pressure/ nasal congestion  GFR >60. Order for Paxlovid sent to pharmacy. Advised possible SE include nausea, diarrhea, loss of taste and hypertension.  If chest pain, shortness of breath, fever >104 that is unresponsive to antipyretics noted, advised to go to ER for evaluation.   Advised that the CDC recommends the following criteria prior to ending isolation in vaccinated persons at least 5 days since symptoms onset, and then additional 5 days of masking,  AND 3 days fever free without antipyretics (Tylenol or Ibuprofen) AND improvement in respiratory symptoms.

## 2021-04-20 ENCOUNTER — Other Ambulatory Visit: Payer: Self-pay | Admitting: Family Medicine

## 2021-04-25 ENCOUNTER — Telehealth: Payer: Self-pay | Admitting: *Deleted

## 2021-04-25 NOTE — Telephone Encounter (Signed)
Received call from patient.   Reports that he was treated for COVID with Paxlovid. Reports that while he was on antibody therapy, he was feeling much better and was testing negative.   States that he has begun having Sx again, and is currently testing positive for COVID.   Inquired as to next steps.   Call placed to patient. LMTRC.

## 2021-04-25 NOTE — Telephone Encounter (Signed)
Received return call from patient.   Reports that Sx are mild and currently include cough and fatigue.   Advised that Paxlovid works to stop viral replication. It is currently given for five days. Perhaps it works for the five-day period, but some people still have virus in their body after five days. When Paxlovid is stopped, the virus starts replicating again.  Advised that he may test positive with mild sx for another 5-10 days. Advised if Sx worsen or fail to improve, contact office for appointment.

## 2021-04-30 ENCOUNTER — Other Ambulatory Visit: Payer: Medicare Other

## 2021-04-30 ENCOUNTER — Other Ambulatory Visit: Payer: Self-pay

## 2021-04-30 DIAGNOSIS — Z136 Encounter for screening for cardiovascular disorders: Secondary | ICD-10-CM

## 2021-04-30 DIAGNOSIS — Z125 Encounter for screening for malignant neoplasm of prostate: Secondary | ICD-10-CM

## 2021-04-30 DIAGNOSIS — Z13220 Encounter for screening for lipoid disorders: Secondary | ICD-10-CM | POA: Diagnosis not present

## 2021-04-30 DIAGNOSIS — Z0000 Encounter for general adult medical examination without abnormal findings: Secondary | ICD-10-CM

## 2021-04-30 DIAGNOSIS — I10 Essential (primary) hypertension: Secondary | ICD-10-CM

## 2021-04-30 DIAGNOSIS — E785 Hyperlipidemia, unspecified: Secondary | ICD-10-CM

## 2021-05-02 ENCOUNTER — Encounter: Payer: Self-pay | Admitting: Family Medicine

## 2021-05-02 ENCOUNTER — Ambulatory Visit (INDEPENDENT_AMBULATORY_CARE_PROVIDER_SITE_OTHER): Payer: BC Managed Care – PPO | Admitting: Family Medicine

## 2021-05-02 ENCOUNTER — Ambulatory Visit: Payer: Medicare Other | Admitting: Family Medicine

## 2021-05-02 ENCOUNTER — Ambulatory Visit (INDEPENDENT_AMBULATORY_CARE_PROVIDER_SITE_OTHER): Payer: Medicare Other | Admitting: Family Medicine

## 2021-05-02 ENCOUNTER — Other Ambulatory Visit: Payer: Self-pay

## 2021-05-02 VITALS — BP 120/72 | HR 56 | Temp 98.7°F | Resp 14 | Ht 75.0 in | Wt 199.0 lb

## 2021-05-02 DIAGNOSIS — Z23 Encounter for immunization: Secondary | ICD-10-CM

## 2021-05-02 DIAGNOSIS — M48061 Spinal stenosis, lumbar region without neurogenic claudication: Secondary | ICD-10-CM | POA: Diagnosis not present

## 2021-05-02 DIAGNOSIS — Z0001 Encounter for general adult medical examination with abnormal findings: Secondary | ICD-10-CM

## 2021-05-02 DIAGNOSIS — Q2112 Patent foramen ovale: Secondary | ICD-10-CM

## 2021-05-02 DIAGNOSIS — Q211 Atrial septal defect: Secondary | ICD-10-CM | POA: Diagnosis not present

## 2021-05-02 DIAGNOSIS — Z125 Encounter for screening for malignant neoplasm of prostate: Secondary | ICD-10-CM | POA: Diagnosis not present

## 2021-05-02 DIAGNOSIS — Z0000 Encounter for general adult medical examination without abnormal findings: Secondary | ICD-10-CM

## 2021-05-02 LAB — COMPLETE METABOLIC PANEL WITH GFR
AG Ratio: 2.1 (calc) (ref 1.0–2.5)
ALT: 20 U/L (ref 9–46)
AST: 28 U/L (ref 10–35)
Albumin: 4.4 g/dL (ref 3.6–5.1)
Alkaline phosphatase (APISO): 39 U/L (ref 35–144)
BUN: 20 mg/dL (ref 7–25)
CO2: 29 mmol/L (ref 20–32)
Calcium: 9.4 mg/dL (ref 8.6–10.3)
Chloride: 105 mmol/L (ref 98–110)
Creat: 1.03 mg/dL (ref 0.70–1.35)
Globulin: 2.1 g/dL (calc) (ref 1.9–3.7)
Glucose, Bld: 99 mg/dL (ref 65–99)
Potassium: 4.6 mmol/L (ref 3.5–5.3)
Sodium: 139 mmol/L (ref 135–146)
Total Bilirubin: 1 mg/dL (ref 0.2–1.2)
Total Protein: 6.5 g/dL (ref 6.1–8.1)
eGFR: 79 mL/min/{1.73_m2} (ref >=60)

## 2021-05-02 LAB — CBC WITH DIFFERENTIAL/PLATELET
Absolute Monocytes: 330 cells/uL (ref 200–950)
Basophils Absolute: 10 cells/uL (ref 0–200)
Basophils Relative: 0.3 %
Eosinophils Absolute: 129 cells/uL (ref 15–500)
Eosinophils Relative: 3.9 %
HCT: 41.6 % (ref 38.5–50.0)
Hemoglobin: 14.3 g/dL (ref 13.2–17.1)
Lymphs Abs: 1554 cells/uL (ref 850–3900)
MCH: 32.1 pg (ref 27.0–33.0)
MCHC: 34.4 g/dL (ref 32.0–36.0)
MCV: 93.3 fL (ref 80.0–100.0)
MPV: 10.9 fL (ref 7.5–12.5)
Monocytes Relative: 10 %
Neutro Abs: 1277 cells/uL — ABNORMAL LOW (ref 1500–7800)
Neutrophils Relative %: 38.7 %
Platelets: 171 10*3/uL (ref 140–400)
RBC: 4.46 10*6/uL (ref 4.20–5.80)
RDW: 13.1 % (ref 11.0–15.0)
Total Lymphocyte: 47.1 %
WBC: 3.3 10*3/uL — ABNORMAL LOW (ref 3.8–10.8)

## 2021-05-02 LAB — TEST AUTHORIZATION

## 2021-05-02 LAB — LIPID PANEL
Cholesterol: 179 mg/dL (ref <200)
HDL: 86 mg/dL (ref >=40)
LDL Cholesterol (Calc): 81 mg/dL (calc)
Non-HDL Cholesterol (Calc): 93 mg/dL (calc) (ref <130)
Total CHOL/HDL Ratio: 2.1 (calc) (ref <5.0)
Triglycerides: 42 mg/dL (ref <150)

## 2021-05-02 LAB — PSA: PSA: 0.66 ng/mL (ref <=4.00)

## 2021-05-02 NOTE — Progress Notes (Signed)
Subjective:    Patient ID: Patrick Wall, male    DOB: [birth year], 70 y.o.   MRN: [id number]  HPI  Patient is a very pleasant 70 year old Caucasian male here today for complete physical exam.  His last colonoscopy was in 2014.  Previously he thought he had a history of polyps however we reviewed his most recent colonoscopy report and the patient had no polyps or abnormalities found.  I am not certain why they recommended a repeat colonoscopy in 5 years.  He has no family history of colon cancer and he denies any history of polyps.  Therefore it seems that he would be appropriate for a 10-year screening interval.  We discussed performing the colonoscopy now versus waiting until 2024.  We have elected to wait until 2024.  He is due for a booster on his COVID-vaccine in November.  He also has a past medical history of a PFO.  He does have a murmur heard best at the apex that radiates into both carotid arteries.  This sounds louder to me this year than last year.  He has a follow-up with his cardiologist in November.  He has not noticed a change in his exercise tolerance.  He denies any orthopnea or paroxysmal nocturnal dyspnea.  In fact he is planning on running a triathlon this weekend.  Therefore there does not seem to be any significant decline in his exercise tolerance. Immunization History  Administered Date(s) Administered   Fluad Quad(high Dose 65+) 07/03/2020   Influenza Split 05/21/2015   Influenza-Unspecified 06/02/2016, 05/18/2017, 05/24/2018, 05/27/2019   PFIZER Comirnaty(Gray Top)Covid-19 Tri-Sucrose Vaccine 01/15/2021   PFIZER(Purple Top)SARS-COV-2 Vaccination 09/03/2019, 09/23/2019, 05/27/2020   Pneumococcal Conjugate-13 07/09/2017   Pneumococcal Polysaccharide-23 09/19/2014, 04/30/2020   Tdap 04/01/2014   Zoster, Live 03/07/2014    Lab on 04/30/2021  Component Date Value Ref Range Status   WBC 04/30/2021 3.3 (A) 3.8 - 10.8 Thousand/uL Final   RBC 04/30/2021 4.46  4.20 - 5.80  Million/uL Final   Hemoglobin 04/30/2021 14.3  13.2 - 17.1 g/dL Final   HCT 04/30/2021 41.6  38.5 - 50.0 % Final   MCV 04/30/2021 93.3  80.0 - 100.0 fL Final   MCH 04/30/2021 32.1  27.0 - 33.0 pg Final   MCHC 04/30/2021 34.4  32.0 - 36.0 g/dL Final   RDW 04/30/2021 13.1  11.0 - 15.0 % Final   Platelets 04/30/2021 171  140 - 400 Thousand/uL Final   MPV 04/30/2021 10.9  7.5 - 12.5 fL Final   Neutro Abs 04/30/2021 1,277 (A) 1,500 - 7,800 cells/uL Final   Lymphs Abs 04/30/2021 1,554  850 - 3,900 cells/uL Final   Absolute Monocytes 04/30/2021 330  200 - 950 cells/uL Final   Eosinophils Absolute 04/30/2021 129  15 - 500 cells/uL Final   Basophils Absolute 04/30/2021 10  0 - 200 cells/uL Final   Neutrophils Relative % 04/30/2021 38.7  % Final   Total Lymphocyte 04/30/2021 47.1  % Final   Monocytes Relative 04/30/2021 10.0  % Final   Eosinophils Relative 04/30/2021 3.9  % Final   Basophils Relative 04/30/2021 0.3  % Final   Glucose, Bld 04/30/2021 99  65 - 99 mg/dL Final   Comment: .            Fasting reference interval .    BUN 04/30/2021 20  7 - 25 mg/dL Final   Creat 04/30/2021 1.03  0.70 - 1.35 mg/dL Final   eGFR 04/30/2021 79  > OR = 60 mL/min/1.67m Final  Comment: The eGFR is based on the CKD-EPI 2021 equation. To calculate  the new eGFR from a previous Creatinine or Cystatin C result, go to https://www.kidney.org/professionals/ kdoqi/gfr%5Fcalculator    BUN/Creatinine Ratio 15/72/6203 NOT APPLICABLE  6 - 22 (calc) Final   Sodium 04/30/2021 139  135 - 146 mmol/L Final   Potassium 04/30/2021 4.6  3.5 - 5.3 mmol/L Final   Chloride 04/30/2021 105  98 - 110 mmol/L Final   CO2 04/30/2021 29  20 - 32 mmol/L Final   Calcium 04/30/2021 9.4  8.6 - 10.3 mg/dL Final   Total Protein 04/30/2021 6.5  6.1 - 8.1 g/dL Final   Albumin 04/30/2021 4.4  3.6 - 5.1 g/dL Final   Globulin 04/30/2021 2.1  1.9 - 3.7 g/dL (calc) Final   AG Ratio 04/30/2021 2.1  1.0 - 2.5 (calc) Final   Total Bilirubin  04/30/2021 1.0  0.2 - 1.2 mg/dL Final   Alkaline phosphatase (APISO) 04/30/2021 39  35 - 144 U/L Final   AST 04/30/2021 28  10 - 35 U/L Final   ALT 04/30/2021 20  9 - 46 U/L Final   Cholesterol 04/30/2021 179  <200 mg/dL Final   HDL 04/30/2021 86  > OR = 40 mg/dL Final   Triglycerides 04/30/2021 42  <150 mg/dL Final   LDL Cholesterol (Calc) 04/30/2021 81  mg/dL (calc) Final   Comment: Reference range: <100 . Desirable range <100 mg/dL for primary prevention;   <70 mg/dL for patients with CHD or diabetic patients  with > or = 2 CHD risk factors. Marland Kitchen LDL-C is now calculated using the Martin-Hopkins  calculation, which is a validated novel method providing  better accuracy than the Friedewald equation in the  estimation of LDL-C.  Cresenciano Genre et al. Annamaria Helling. 5597;416(38): 2061-2068  (http://education.[facility].com/faq/FAQ164)    Total CHOL/HDL Ratio 04/30/2021 2.1  <5.0 (calc) Final   Non-HDL Cholesterol (Calc) 04/30/2021 93  <130 mg/dL (calc) Final   Comment: For patients with diabetes plus 1 major ASCVD risk  factor, treating to a non-HDL-C goal of <100 mg/dL  (LDL-C of <70 mg/dL) is considered a therapeutic  option.     Past Medical History:  Diagnosis Date   GERD (gastroesophageal reflux disease)    tums   Hearing loss    History of kidney stones    Hyperlipidemia    Labral tear of shoulder    Lumbar spinal stenosis    Past Surgical History:  Procedure Laterality Date   BACK SURGERY  08/19/2020   COLONOSCOPY     EYE SURGERY  2000   lasic   MOUTH SURGERY     teeth removed, dental implants placed   TONSILLECTOMY     VASECTOMY  1983   Current Outpatient Medications on File Prior to Visit  Medication Sig Dispense Refill   aspirin EC 81 MG tablet Take 81 mg by mouth daily.     atorvastatin (LIPITOR) 40 MG tablet TAKE 1 TABLET BY MOUTH  DAILY 90 tablet 3   Cholecalciferol (VITAMIN D-3) 125 MCG (5000 UT) TABS Take 5,000 Units by mouth 3 (three) times a week.      Multiple Vitamin (MULTIVITAMIN) capsule Take 1 capsule by mouth daily.     Current Facility-Administered Medications on File Prior to Visit  Medication Dose Route Frequency Provider Last Rate Last Admin   sodium chloride flush (NS) 0.9 % injection 10 mL  10 mL Intravenous PRN Josue Hector, MD   20 mL at 01/08/21 1535   Allergies  Allergen Reactions   Sulfa Antibiotics     Unknown reaction   Social History   Socioeconomic History   Marital status: Married    Spouse name: Not on file   Number of children: Not on file   Years of education: Not on file   Highest education level: Not on file  Occupational History   Not on file  Tobacco Use   Smoking status: Never   Smokeless tobacco: Never  Vaping Use   Vaping Use: Never used  Substance and Sexual Activity   Alcohol use: Yes    Alcohol/week: 9.0 standard drinks    Types: 5 Glasses of wine, 4 Cans of beer per week   Drug use: No   Sexual activity: Yes    Birth control/protection: Surgical    Comment: vasectomy  Other Topics Concern   Not on file  Social History Narrative   Not on file   Social Determinants of Health   Financial Resource Strain: Low Risk    Difficulty of Paying Living Expenses: Not hard at all  Food Insecurity: No Food Insecurity   Worried About Charity fundraiser in the Last Year: Never true   Ran Out of Food in the Last Year: Never true  Transportation Needs: No Transportation Needs   Lack of Transportation (Medical): No   Lack of Transportation (Non-Medical): No  Physical Activity: Sufficiently Active   Days of Exercise per Week: 7 days   Minutes of Exercise per Session: 60 min  Stress: No Stress Concern Present   Feeling of Stress : Not at all  Social Connections: Moderately Isolated   Frequency of Communication with Friends and Family: Once a week   Frequency of Social Gatherings with Friends and Family: Once a week   Attends Religious Services: Never   Marine scientist or  Organizations: Yes   Attends Music therapist: More than 4 times per year   Marital Status: Married  Human resources officer Violence: Not At Risk   Fear of Current or Ex-Partner: No   Emotionally Abused: No   Physically Abused: No   Sexually Abused: No   Family History  Problem Relation Age of Onset   Cancer Mother        skin   Heart disease Mother    Vision loss Mother    Early death Father    Arthritis Maternal Grandmother    Cancer Maternal Grandmother    Hyperlipidemia Maternal Grandmother    Hypertension Maternal Grandmother    Cancer Maternal Grandfather    Heart disease Paternal Grandfather    Stroke Paternal Grandfather       Review of Systems  All other systems reviewed and are negative.     Objective:   Physical Exam Vitals reviewed.  Constitutional:      General: He is not in acute distress.    Appearance: He is well-developed. He is not diaphoretic.  HENT:     Head: Normocephalic and atraumatic.     Right Ear: External ear normal.     Left Ear: External ear normal.     Nose: Nose normal.     Mouth/Throat:     Pharynx: No oropharyngeal exudate.  Eyes:     General: No scleral icterus.       Right eye: No discharge.        Left eye: No discharge.     Conjunctiva/sclera: Conjunctivae normal.     Pupils: Pupils are equal, round, and reactive to light.  Neck:     Thyroid: No thyromegaly.     Vascular: Carotid bruit present. No JVD.     Trachea: No tracheal deviation.  Cardiovascular:     Rate and Rhythm: Normal rate and regular rhythm.     Heart sounds: Murmur heard.    No friction rub. No gallop.  Pulmonary:     Effort: Pulmonary effort is normal. No respiratory distress.     Breath sounds: Normal breath sounds. No wheezing or rales.  Chest:     Chest wall: No tenderness.  Abdominal:     General: Bowel sounds are normal. There is no distension.     Palpations: Abdomen is soft. There is no mass.     Tenderness: There is no abdominal  tenderness. There is no guarding or rebound.  Musculoskeletal:     Cervical back: Normal range of motion and neck supple.  Lymphadenopathy:     Cervical: No cervical adenopathy.  Skin:    General: Skin is warm.     Coloration: Skin is not pale.     Findings: No erythema or rash.  Neurological:     Mental Status: He is alert and oriented to person, place, and time.     Cranial Nerves: No cranial nerve deficit.     Motor: No abnormal muscle tone.     Coordination: Coordination normal.     Deep Tendon Reflexes: Reflexes normal.  Psychiatric:        Behavior: Behavior normal.        Thought Content: Thought content normal.        Judgment: Judgment normal.          Assessment & Plan:  Encounter for Medicare annual wellness exam  Physical exam, annual  Screening PSA (prostate specific antigen)  Spinal stenosis of lumbar region without neurogenic claudication  PFO (patent foramen ovale) Patient's cardiac murmur sounds louder today than I have previously documented.  It is transmitted into both carotid arteries.  He has an appointment to see his cardiologist in November and I believe it is perfectly fine to wait until then to repeat the echocardiogram as his review of systems is essentially normal.  He received his flu shot today.  Colonoscopy will be deferred until 2024.  PSA is pending.  The remainder of his lab work is outstanding.  Encouraged him to get a flu shot as well as Shingrix and a booster on his COVID shot. Lab on 04/30/2021  Component Date Value Ref Range Status   WBC 04/30/2021 3.3 (A) 3.8 - 10.8 Thousand/uL Final   RBC 04/30/2021 4.46  4.20 - 5.80 Million/uL Final   Hemoglobin 04/30/2021 14.3  13.2 - 17.1 g/dL Final   HCT 04/30/2021 41.6  38.5 - 50.0 % Final   MCV 04/30/2021 93.3  80.0 - 100.0 fL Final   MCH 04/30/2021 32.1  27.0 - 33.0 pg Final   MCHC 04/30/2021 34.4  32.0 - 36.0 g/dL Final   RDW 04/30/2021 13.1  11.0 - 15.0 % Final   Platelets 04/30/2021 171   140 - 400 Thousand/uL Final   MPV 04/30/2021 10.9  7.5 - 12.5 fL Final   Neutro Abs 04/30/2021 1,277 (A) 1,500 - 7,800 cells/uL Final   Lymphs Abs 04/30/2021 1,554  850 - 3,900 cells/uL Final   Absolute Monocytes 04/30/2021 330  200 - 950 cells/uL Final   Eosinophils Absolute 04/30/2021 129  15 - 500 cells/uL Final   Basophils Absolute 04/30/2021 10  0 - 200 cells/uL Final  Neutrophils Relative % 04/30/2021 38.7  % Final   Total Lymphocyte 04/30/2021 47.1  % Final   Monocytes Relative 04/30/2021 10.0  % Final   Eosinophils Relative 04/30/2021 3.9  % Final   Basophils Relative 04/30/2021 0.3  % Final   Glucose, Bld 04/30/2021 99  65 - 99 mg/dL Final   Comment: .            Fasting reference interval .    BUN 04/30/2021 20  7 - 25 mg/dL Final   Creat 04/30/2021 1.03  0.70 - 1.35 mg/dL Final   eGFR 04/30/2021 79  > OR = 60 mL/min/1.24m Final   Comment: The eGFR is based on the CKD-EPI 2021 equation. To calculate  the new eGFR from a previous Creatinine or Cystatin C result, go to https://www.kidney.org/professionals/ kdoqi/gfr%5Fcalculator    BUN/Creatinine Ratio 044/81/8563NOT APPLICABLE  6 - 22 (calc) Final   Sodium 04/30/2021 139  135 - 146 mmol/L Final   Potassium 04/30/2021 4.6  3.5 - 5.3 mmol/L Final   Chloride 04/30/2021 105  98 - 110 mmol/L Final   CO2 04/30/2021 29  20 - 32 mmol/L Final   Calcium 04/30/2021 9.4  8.6 - 10.3 mg/dL Final   Total Protein 04/30/2021 6.5  6.1 - 8.1 g/dL Final   Albumin 04/30/2021 4.4  3.6 - 5.1 g/dL Final   Globulin 04/30/2021 2.1  1.9 - 3.7 g/dL (calc) Final   AG Ratio 04/30/2021 2.1  1.0 - 2.5 (calc) Final   Total Bilirubin 04/30/2021 1.0  0.2 - 1.2 mg/dL Final   Alkaline phosphatase (APISO) 04/30/2021 39  35 - 144 U/L Final   AST 04/30/2021 28  10 - 35 U/L Final   ALT 04/30/2021 20  9 - 46 U/L Final   Cholesterol 04/30/2021 179  <200 mg/dL Final   HDL 04/30/2021 86  > OR = 40 mg/dL Final   Triglycerides 04/30/2021 42  <150 mg/dL Final    LDL Cholesterol (Calc) 04/30/2021 81  mg/dL (calc) Final   Comment: Reference range: <100 . Desirable range <100 mg/dL for primary prevention;   <70 mg/dL for patients with CHD or diabetic patients  with > or = 2 CHD risk factors. .Marland KitchenLDL-C is now calculated using the Martin-Hopkins  calculation, which is a validated novel method providing  better accuracy than the Friedewald equation in the  estimation of LDL-C.  MCresenciano Genreet al. JAnnamaria Helling 21497;026(37: 2061-2068  (http://education.[facility].com/faq/FAQ164)    Total CHOL/HDL Ratio 04/30/2021 2.1  <5.0 (calc) Final   Non-HDL Cholesterol (Calc) 04/30/2021 93  <130 mg/dL (calc) Final   Comment: For patients with diabetes plus 1 major ASCVD risk  factor, treating to a non-HDL-C goal of <100 mg/dL  (LDL-C of <70 mg/dL) is considered a therapeutic  option.

## 2021-06-11 DIAGNOSIS — H35373 Puckering of macula, bilateral: Secondary | ICD-10-CM | POA: Diagnosis not present

## 2021-06-11 DIAGNOSIS — H5319 Other subjective visual disturbances: Secondary | ICD-10-CM | POA: Diagnosis not present

## 2021-07-15 ENCOUNTER — Ambulatory Visit (HOSPITAL_COMMUNITY): Payer: BC Managed Care – PPO | Attending: Internal Medicine

## 2021-07-15 ENCOUNTER — Other Ambulatory Visit: Payer: Self-pay

## 2021-07-15 DIAGNOSIS — I350 Nonrheumatic aortic (valve) stenosis: Secondary | ICD-10-CM

## 2021-07-15 LAB — ECHOCARDIOGRAM COMPLETE
AR max vel: 1.36 cm2
AV Area VTI: 1.35 cm2
AV Area mean vel: 1.37 cm2
AV Mean grad: 19 mmHg
AV Peak grad: 35.3 mmHg
Ao pk vel: 2.97 m/s
Area-P 1/2: 3.76 cm2
S' Lateral: 2.8 cm

## 2021-07-31 ENCOUNTER — Other Ambulatory Visit: Payer: Self-pay

## 2021-07-31 ENCOUNTER — Ambulatory Visit: Payer: BC Managed Care – PPO | Admitting: Specialist

## 2021-07-31 ENCOUNTER — Encounter: Payer: Self-pay | Admitting: Specialist

## 2021-07-31 ENCOUNTER — Ambulatory Visit: Payer: BC Managed Care – PPO

## 2021-07-31 VITALS — BP 132/89 | HR 51 | Ht 75.0 in | Wt 199.0 lb

## 2021-07-31 DIAGNOSIS — M222X2 Patellofemoral disorders, left knee: Secondary | ICD-10-CM

## 2021-07-31 DIAGNOSIS — Z4889 Encounter for other specified surgical aftercare: Secondary | ICD-10-CM

## 2021-07-31 DIAGNOSIS — M47816 Spondylosis without myelopathy or radiculopathy, lumbar region: Secondary | ICD-10-CM

## 2021-07-31 DIAGNOSIS — M222X1 Patellofemoral disorders, right knee: Secondary | ICD-10-CM | POA: Diagnosis not present

## 2021-07-31 DIAGNOSIS — Z981 Arthrodesis status: Secondary | ICD-10-CM | POA: Diagnosis not present

## 2021-07-31 MED ORDER — MELOXICAM 15 MG PO TABS: 15.0000 mg | ORAL_TABLET | Freq: Every day | ORAL | 2 refills | Status: DC

## 2021-07-31 MED ORDER — DICLOFENAC SODIUM 1 % EX GEL: 4.0000 g | Freq: Four times a day (QID) | CUTANEOUS | 2 refills | Status: DC

## 2021-07-31 NOTE — Patient Instructions (Signed)
Avoid bending, stooping and avoid lifting weights greater than 10 lbs. Avoid prolong standing and walking. Avoid frequent bending and stooping  No lifting greater than 20 lbs May use ice or moist heat for pain. Weight loss is of benefit. CT of the lumbar spine to assess healing of the TLIF and the presence of adjacent level spondylosis and hardware relationship to facet joint.  Meloxicam for arthrosis pain. Local voltaren (diclofenac gel up to 3-4 times per day.  Patellofemoral Pain Syndrome Patellofemoral pain syndrome is a condition in which the tissue (cartilage) on the underside of the kneecap (patella) softens or breaks down. This causes pain in the front of the knee. The condition is also called runner's knee or chondromalacia patella. Patellofemoral pain syndrome is most common in young adults who are active in sports. The knee is the largest joint in the body. The patella covers the front of the knee and is attached to muscles above and below the knee. The underside of the patella is covered with a smooth type of cartilage (synovium). The smooth surface helps the patella glide easily when you move your knee. Patellofemoral pain syndrome causes swelling in the joint linings and bone surfaces in the knee. What are the causes? This condition may be caused by: Overuse of the knee. Poor alignment of your knee joints. Weak leg muscles. A direct hit to your kneecap. What increases the risk? You are more likely to develop this condition if: You do a lot of activities that can wear down your kneecap. These include: Running. Squatting. Climbing stairs. You start a new physical activity or exercise program. You wear shoes that do not fit well. You do not have good leg strength. You are overweight. What are the signs or symptoms? The main symptom of this condition is knee pain. This may feel like a dull, aching pain underneath your patella, in the front of your knee. There may be a popping  or cracking sound when you move your knee. Pain may get worse with: Exercise. Climbing stairs. Running. Jumping. Squatting. Kneeling. Sitting for a long time. Moving or pushing on your patella. How is this diagnosed? This condition may be diagnosed based on: Your symptoms and medical history. You may be asked about your recent physical activities and which ones cause knee pain. A physical exam. This may include: Moving your patella back and forth. Checking your range of knee motion. Having you squat or jump to see if you have pain. Checking the strength of your leg muscles. Imaging tests to confirm the diagnosis. These may include an MRI of your knee. How is this treated? This condition may be treated at home with rest, ice, compression, and elevation (RICE).  Other treatments may include: NSAIDs, such as ibuprofen. Physical therapy to stretch and strengthen your leg muscles. Shoe inserts (orthotics) to take stress off your knee. A knee brace or knee support. Adhesive tapes to the skin. Surgery to remove damaged cartilage or move the patella to a better position. This is rare. Follow these instructions at home: If you have a brace: Wear the brace as told by your health care provider. Remove it only as told by your health care provider. Loosen the brace if your toes tingle, become numb, or turn cold and blue. Keep the brace clean. If the brace is not waterproof: Do not let it get wet. Cover it with a watertight covering when you take a bath or a shower. Managing pain, stiffness, and swelling  If directed, put  ice on the painful area. To do this: If you have a removable brace, remove it as told by your health care provider. Put ice in a plastic bag. Place a towel between your skin and the bag. Leave the ice on for 20 minutes, 2-3 times a day. Remove the ice if your skin turns bright red. This is very important. If you cannot feel pain, heat, or cold, you have a greater risk of  damage to the area. Move your toes often to reduce stiffness and swelling. Raise (elevate) the injured area above the level of your heart while you are sitting or lying down. Activity Rest your knee. Avoid activities that cause knee pain. Perform stretching and strengthening exercises as told by your health care provider or physical therapist. Return to your normal activities as told by your health care provider. Ask your health care provider what activities are safe for you. General instructions Take over-the-counter and prescription medicines only as told by your health care provider. Use splints, braces, knee supports, or walking aids as directed by your health care provider. Do not use any products that contain nicotine or tobacco, such as cigarettes, e-cigarettes, and chewing tobacco. These can delay healing. If you need help quitting, ask your health care provider. Keep all follow-up visits. This is important. Contact a health care provider if: Your symptoms get worse. You are not improving with home care. Summary Patellofemoral pain syndrome is a condition in which the tissue (cartilage) on the underside of the kneecap (patella) softens or breaks down. This condition causes swelling in the joint linings and bone surfaces in the knee. This leads to pain in the front of the knee. This condition may be treated at home with rest, ice, compression, and elevation (RICE). Use splints, braces, knee supports, or walking aids as directed by your health care provider. This information is not intended to replace advice given to you by your health care provider. Make sure you discuss any questions you have with your health care provider. Document Revised: 02/01/2020 Document Reviewed: 02/01/2020 Elsevier Patient Education  2022 [employer] 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 Well padded shoes  help. Ice the knee that is suffering from osteoarthritis, only real proven treatments are Weight loss, NSIADs like diclofenac and exercise. Well padded shoes help. Ice the knee 2-3 times a day 15-20 mins at a time.-3 times a day 15-20 mins at a time. Hot showers in the AM.  Injection with steroid may be of benefit. Hemp CBD capsules, amazon.com 5,000-7,000 mg per bottle, 60 capsules per bottle, take one capsule twice a day. Cane in the left hand to use with left leg weight bearing. Follow-Up Instructions: No follow-ups on file.

## 2021-07-31 NOTE — Progress Notes (Signed)
Office Visit Note   Patient: Patrick Wall           Date of Birth: [birth year]           MRN: [id number] Visit Date: 07/31/2021              Requested by: Donita Brooks, MD [address] [facility] [address] [place],  [place] [address] PCP: Donita Brooks, MD   Assessment & Plan: Visit Diagnoses:  1. S/P lumbar spinal fusion   2. Encounter for other specified surgical aftercare   3. Spondylosis of lumbar region without myelopathy or radiculopathy   4. Patellofemoral pain syndrome of both knees     Plan: Avoid bending, stooping and avoid lifting weights greater than 10 lbs. Avoid prolong standing and walking. Avoid frequent bending and stooping  No lifting greater than 20 lbs May use ice or moist heat for pain. Weight loss is of benefit. CT of the lumbar spine to assess healing of the TLIF and the presence of adjacent level spondylosis and hardware relationship to facet joint.  Meloxicam for arthrosis pain. Local voltaren (diclofenac gel up to 3-4 times per day.  Patellofemoral Pain Syndrome Patellofemoral pain syndrome is a condition in which the tissue (cartilage) on the underside of the kneecap (patella) softens or breaks down. This causes pain in the front of the knee. The condition is also called runner's knee or chondromalacia patella. Patellofemoral pain syndrome is most common in young adults who are active in sports. The knee is the largest joint in the body. The patella covers the front of the knee and is attached to muscles above and below the knee. The underside of the patella is covered with a smooth type of cartilage (synovium). The smooth surface helps the patella glide easily when you move your knee. Patellofemoral pain syndrome causes swelling in the joint linings and bone surfaces in the knee. What are the causes? This condition may be caused by: Overuse of the knee. Poor alignment of your knee joints. Weak leg muscles. A direct hit to your kneecap. What increases the  risk? You are more likely to develop this condition if: You do a lot of activities that can wear down your kneecap. These include: Running. Squatting. Climbing stairs. You start a new physical activity or exercise program. You wear shoes that do not fit well. You do not have good leg strength. You are overweight. What are the signs or symptoms? The main symptom of this condition is knee pain. This may feel like a dull, aching pain underneath your patella, in the front of your knee. There may be a popping or cracking sound when you move your knee. Pain may get worse with: Exercise. Climbing stairs. Running. Jumping. Squatting. Kneeling. Sitting for a long time. Moving or pushing on your patella. How is this diagnosed? This condition may be diagnosed based on: Your symptoms and medical history. You may be asked about your recent physical activities and which ones cause knee pain. A physical exam. This may include: Moving your patella back and forth. Checking your range of knee motion. Having you squat or jump to see if you have pain. Checking the strength of your leg muscles. Imaging tests to confirm the diagnosis. These may include an MRI of your knee. How is this treated? This condition may be treated at home with rest, ice, compression, and elevation (RICE).  Other treatments may include: NSAIDs, such as ibuprofen. Physical therapy to stretch and strengthen your leg muscles.  Shoe inserts (orthotics) to take stress off your knee. A knee brace or knee support. Adhesive tapes to the skin. Surgery to remove damaged cartilage or move the patella to a better position. This is rare. Follow these instructions at home: If you have a brace: Wear the brace as told by your health care provider. Remove it only as told by your health care provider. Loosen the brace if your toes tingle, become numb, or turn cold and blue. Keep the brace clean. If the brace is not waterproof: Do not let  it get wet. Cover it with a watertight covering when you take a bath or a shower. Managing pain, stiffness, and swelling  If directed, put ice on the painful area. To do this: If you have a removable brace, remove it as told by your health care provider. Put ice in a plastic bag. Place a towel between your skin and the bag. Leave the ice on for 20 minutes, 2-3 times a day. Remove the ice if your skin turns bright red. This is very important. If you cannot feel pain, heat, or cold, you have a greater risk of damage to the area. Move your toes often to reduce stiffness and swelling. Raise (elevate) the injured area above the level of your heart while you are sitting or lying down. Activity Rest your knee. Avoid activities that cause knee pain. Perform stretching and strengthening exercises as told by your health care provider or physical therapist. Return to your normal activities as told by your health care provider. Ask your health care provider what activities are safe for you. General instructions Take over-the-counter and prescription medicines only as told by your health care provider. Use splints, braces, knee supports, or walking aids as directed by your health care provider. Do not use any products that contain nicotine or tobacco, such as cigarettes, e-cigarettes, and chewing tobacco. These can delay healing. If you need help quitting, ask your health care provider. Keep all follow-up visits. This is important. Contact a health care provider if: Your symptoms get worse. You are not improving with home care. Summary Patellofemoral pain syndrome is a condition in which the tissue (cartilage) on the underside of the kneecap (patella) softens or breaks down. This condition causes swelling in the joint linings and bone surfaces in the knee. This leads to pain in the front of the knee. This condition may be treated at home with rest, ice, compression, and elevation (RICE). Use splints,  braces, knee supports, or walking aids as directed by your health care provider. This information is not intended to replace advice given to you by your health care provider. Make sure you discuss any questions you have with your health care provider. Document Revised: 02/01/2020 Document Reviewed: 02/01/2020 Elsevier Patient Education  2022 [employer]. 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 Well padded shoes help. Ice the knee that is suffering from osteoarthritis, only real proven treatments are Weight loss, NSIADs like diclofenac and exercise. Well padded shoes help. Ice the knee 2-3 times a day 15-20 mins at a time.-3 times a day 15-20 mins at a time. Hot showers in the AM.  Injection with steroid may be of benefit. Hemp CBD capsules, amazon.com 5,000-7,000 mg per bottle, 60 capsules per bottle, take one capsule twice a day. Cane in the left hand to use with left leg weight bearing. Follow-Up Instructions: No follow-ups on file.    Follow-Up Instructions:  Return in about 3 weeks (around 08/21/2021).   Orders:  Orders Placed This Encounter  Procedures   XR Lumbar Spine 2-3 Views   CT LUMBAR SPINE WO CONTRAST   Meds ordered this encounter  Medications   meloxicam (MOBIC) 15 MG tablet    Sig: Take 1 tablet (15 mg total) by mouth daily.    Dispense:  30 tablet    Refill:  2      Procedures: No procedures performed   Clinical Data: No additional findings.   Subjective: Chief Complaint  Patient presents with   Lower Back - Follow-up    Gets a stabbing pain when he leans over the sink that will go away after a few seconds and he can stand back up    70 year old male with history of TLIF L4-5 for spondylolisthesis 11 months post op. He is an Therapist, nutritional and runs marathons. He has been able to return to running but is having episodic back pain that is central to the lumbar  spine and in the area of the incision and TLIF. No bowel or badder difficulty, no night pain. Has bilateral knee pain after running. Did well with a recent race he did. Episodic 3-4  per day .   Review of Systems   Objective: Vital Signs: BP 132/89 (BP Location: Left Arm, Patient Position: Sitting)   Pulse (!) 51   Ht  (1.905 m)   Wt 199 lb (90.3 kg)   BMI 24.87 kg/m   Physical Exam  Ortho Exam  Specialty Comments:  No specialty comments available.  Imaging: XR Lumbar Spine 2-3 Views  Result Date: 07/31/2021 AP and lateral flexion and extension radiographs demonstrated interbody cage and fusion with less than one mm of difference between flexion and extension over the anterior lumbar fusion level. This suggests that the fusion is taking place the cage is tilted but there is adequate interbody fusion present. Spondylosis of the facets noted at L5-S1 and mild at L3-4.     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   Past Medical History:  Diagnosis Date   GERD (gastroesophageal reflux disease)    tums   Hearing loss    History of kidney stones    Hyperlipidemia    Labral tear of shoulder    Lumbar spinal stenosis     Family History  Problem Relation Age of Onset   Cancer Mother        skin   Heart disease Mother    Vision loss Mother    Early death Father    Arthritis Maternal Grandmother    Cancer Maternal Grandmother    Hyperlipidemia Maternal Grandmother    Hypertension Maternal Grandmother    Cancer Maternal Grandfather    Heart disease Paternal Grandfather    Stroke Paternal Grandfather     Past Surgical History:  Procedure Laterality Date   BACK SURGERY  08/19/2020   COLONOSCOPY     EYE SURGERY  2000   lasic    MOUTH SURGERY     teeth removed, dental implants placed   TONSILLECTOMY     VASECTOMY  1983   Social History   Occupational History   Not on file  Tobacco Use  Smoking status: Never   Smokeless tobacco: Never  Vaping Use   Vaping Use: Never used  Substance and Sexual Activity   Alcohol use: Yes    Alcohol/week: 9.0 standard drinks    Types: 5 Glasses of wine, 4 Cans of beer per week   Drug use: No   Sexual activity: Yes    Birth control/protection: Surgical    Comment: vasectomy

## 2021-08-21 ENCOUNTER — Other Ambulatory Visit: Payer: Self-pay

## 2021-08-21 ENCOUNTER — Ambulatory Visit
Admission: RE | Admit: 2021-08-21 | Discharge: 2021-08-21 | Disposition: A | Discharge status: Home / Self Care | Payer: BC Managed Care – PPO | Source: Ambulatory Visit | Attending: Specialist | Admitting: Specialist

## 2021-08-21 DIAGNOSIS — M4126 Other idiopathic scoliosis, lumbar region: Secondary | ICD-10-CM | POA: Diagnosis not present

## 2021-08-21 DIAGNOSIS — Z01818 Encounter for other preprocedural examination: Secondary | ICD-10-CM | POA: Diagnosis not present

## 2021-08-21 DIAGNOSIS — Z4889 Encounter for other specified surgical aftercare: Secondary | ICD-10-CM

## 2021-08-21 DIAGNOSIS — M48061 Spinal stenosis, lumbar region without neurogenic claudication: Secondary | ICD-10-CM | POA: Diagnosis not present

## 2021-08-21 DIAGNOSIS — M4316 Spondylolisthesis, lumbar region: Secondary | ICD-10-CM | POA: Diagnosis not present

## 2021-08-22 ENCOUNTER — Ambulatory Visit: Payer: BC Managed Care – PPO | Admitting: Specialist

## 2021-08-22 ENCOUNTER — Encounter: Payer: Self-pay | Admitting: Specialist

## 2021-08-22 VITALS — BP 137/89 | HR 48 | Ht 75.0 in | Wt 199.0 lb

## 2021-08-22 DIAGNOSIS — M4316 Spondylolisthesis, lumbar region: Secondary | ICD-10-CM

## 2021-08-22 DIAGNOSIS — Z981 Arthrodesis status: Secondary | ICD-10-CM

## 2021-08-22 DIAGNOSIS — M4807 Spinal stenosis, lumbosacral region: Secondary | ICD-10-CM

## 2021-08-22 DIAGNOSIS — M222X1 Patellofemoral disorders, right knee: Secondary | ICD-10-CM

## 2021-08-22 DIAGNOSIS — M47816 Spondylosis without myelopathy or radiculopathy, lumbar region: Secondary | ICD-10-CM

## 2021-08-22 DIAGNOSIS — M222X2 Patellofemoral disorders, left knee: Secondary | ICD-10-CM

## 2021-08-22 NOTE — Progress Notes (Signed)
Office Visit Note   Patient: Patrick Wall           Date of Birth: March 18, 1951           MRN: [id number] Visit Date: 08/22/2021              Requested by: Donita Brooks, MD [address] [facility] [address] [place],  [place] [address] PCP: Donita Brooks, MD   Assessment & Plan: Visit Diagnoses:  1. S/P lumbar spinal fusion   2. Spondylolisthesis of lumbar region   3. Patellofemoral pain syndrome of both knees   4. Spondylosis of lumbar region without myelopathy or radiculopathy   5. Spinal stenosis of lumbosacral region     Plan: Avoid bending, stooping and avoid lifting weights greater than 10 lbs. Avoid prolong standing and walking. Pool walking and swimming and bicycling are good exercises done in flexed position and are better tolerated. Avoid frequent bending and stooping  No lifting greater than 10 lbs. May use ice or moist heat for pain. Meloxicam for antiinflamatory or arthritis affect. The left L3 nerve compression can be a cause for left knee pain and can also affect thigh muscle strength and cause knee cap pain indirectly. Terminal quad strengthening is helpful.   Follow-Up Instructions: Return in about 3 months (around 11/20/2021).   Orders:  No orders of the defined types were placed in this encounter.  No orders of the defined types were placed in this encounter.     Procedures: No procedures performed   Clinical Data: Findings:  Narrative & Impression CLINICAL DATA:  70 year old male with spinal fusion 1 year ago. Intermittent pain, pain with bending.  EXAM: CT LUMBAR SPINE WITHOUT CONTRAST  TECHNIQUE: Multidetector CT imaging of the lumbar spine was performed without intravenous contrast administration. Multiplanar CT image reconstructions were also generated.  COMPARISON:  Preoperative lumbar MRI 08/09/2020. Lumbar radiographs 07/31/2021 and earlier.  FINDINGS: Segmentation: Normal, the same numbering system used on the  prior MRI.  Alignment: Stable lumbar lordosis since the preoperative MRI. Subtle anterolisthesis of L4 on L5. Mild levoconvex lumbar scoliosis.  Vertebrae: Postoperative details are below. No acute osseous abnormality identified. Intact visible sacrum and SI joints.  Paraspinal and other soft tissues: Minimal scarring in the left costophrenic angle. Bilateral nephrolithiasis. No hydronephrosis. Mild Calcified aortic atherosclerosis. Negative visible other noncontrast abdominal viscera. Mild postoperative changes to the lumbar paraspinal soft tissues.  Disc levels:  T12-L1:  Negative.  L1-L2:  Negative.  L2-L3:  Stable mild disc bulging.  No significant stenosis.  L3-L4: Mild circumferential disc bulge and up to moderate facet and ligament flavum hypertrophy. Mild spinal stenosis not significantly changed from the preoperative MRI. Moderate left and mild right L3 foraminal stenosis are stable.  L4-L5: Interval decompression and fusion. Bilateral L4 and L5 pedicle screws without loosening. Interbody implant. Mild endplate subsidence is superimposed on a chronic L4 inferior endplate Schmorl's node. Evidence of developing right side posterior element arthrodesis (series 8, image 36 and coronal image 39), fairly solid. No convincing interbody arthrodesis. Resolved spinal stenosis at this level. And bilateral L4 neural foraminal patency appears improved.  L5-S1: Mild disc bulge. Mild to moderate facet hypertrophy. No spinal stenosis. Stable borderline to mild L5 foraminal stenosis.  IMPRESSION: 1. Decompression and fusion at L4-L5 with arthrodesis limited to the right posterior elements at this time. But resolved spinal stenosis and no hardware loosening or adverse features.  2. Degeneration at the L3-L4 with disc bulging adjacent segment and moderate facet hypertrophy.  Borderline to mild spinal stenosis and up to moderate foraminal stenosis greater on the left  appears stable from last year.  3. No significant adjacent segment disease at L5-S1.  4. Aortic Atherosclerosis (ICD10-I70.0).   Electronically Signed   By: Odessa Fleming M.D.   On: 08/22/2021 09:41     Subjective: Chief Complaint  Patient presents with   Lower Back - Follow-up    CT Lumbar spine review    70 year old male with history of L4-5 TLIF for spondylolisthesis and has had improvement in pain but finds that he is still limited in his running program and he is not as excited about running as in the past.  Review of Systems   Objective: Vital Signs: BP 137/89 (BP Location: Left Arm, Patient Position: Sitting)    Pulse (!) 48    Ht 6\' 3"  (1.905 m)    Wt 199 lb (90.3 kg)    BMI 24.87 kg/m   Physical Exam Musculoskeletal:     Lumbar back: Negative right straight leg raise test and negative left straight leg raise test.   Back Exam   Tenderness  The patient is experiencing tenderness in the lumbar.  Range of Motion  Extension:  abnormal  Flexion:  normal  Lateral bend right:  abnormal  Lateral bend left:  abnormal  Rotation right:  abnormal  Rotation left:  abnormal   Muscle Strength  Right Quadriceps:  5/5  Left Quadriceps:  5/5  Right Hamstrings:  5/5  Left Hamstrings:  5/5   Tests  Straight leg raise right: negative Straight leg raise left: negative  Reflexes  Patellar:  2/4 Achilles:  0/4    Specialty Comments:  No specialty comments available.  Imaging: No results found.   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   Past Medical History:  Diagnosis Date   GERD (gastroesophageal reflux disease)    tums   Hearing loss    History of kidney stones    Hyperlipidemia    Labral tear of  shoulder    Lumbar spinal stenosis     Family History  Problem Relation Age of Onset   Cancer Mother        skin   Heart disease Mother    Vision loss Mother    Early death Father    Arthritis Maternal Grandmother    Cancer Maternal Grandmother    Hyperlipidemia Maternal Grandmother    Hypertension Maternal Grandmother    Cancer Maternal Grandfather    Heart disease Paternal Grandfather    Stroke Paternal Grandfather     Past Surgical History:  Procedure Laterality Date   BACK SURGERY  08/19/2020   COLONOSCOPY     EYE SURGERY  2000   lasic   MOUTH SURGERY     teeth removed, dental implants placed   TONSILLECTOMY     VASECTOMY  1983   Social History   Occupational History   Not on file  Tobacco Use   Smoking status: Never   Smokeless tobacco: Never  Vaping Use   Vaping Use: Never used  Substance and Sexual Activity   Alcohol use: Yes    Alcohol/week: 9.0 standard drinks    Types: 5 Glasses of wine, 4 Cans  of beer per week   Drug use: No   Sexual activity: Yes    Birth control/protection: Surgical    Comment: vasectomy

## 2021-08-22 NOTE — Patient Instructions (Signed)
Avoid bending, stooping and avoid lifting weights greater than 10 lbs. Avoid prolong standing and walking. Pool walking and swimming and bicycling are good exercises done in flexed position and are better tolerated. Avoid frequent bending and stooping  No lifting greater than 10 lbs. May use ice or moist heat for pain. Meloxicam for antiinflamatory or arthritis affect. The left L3 nerve compression can be a cause for left knee pain and can also affect thigh muscle strength and cause knee cap pain indirectly. Terminal quad strengthening is helpful.

## 2021-09-22 ENCOUNTER — Other Ambulatory Visit: Payer: Self-pay | Admitting: Specialist

## 2021-10-21 IMAGING — RF DG C-ARM 1-60 MIN
1 series · 4 of 4 positions shown · non-contrast
Comparison: 04/25/2020

CLINICAL DATA: TLIF at L4-5

EXAM:
LUMBAR SPINE - COMPLETE 4+ VIEW; DG C-ARM 1-60 MIN

[Series 1: run · 4 of 4 slices shown]
[im 1/4]
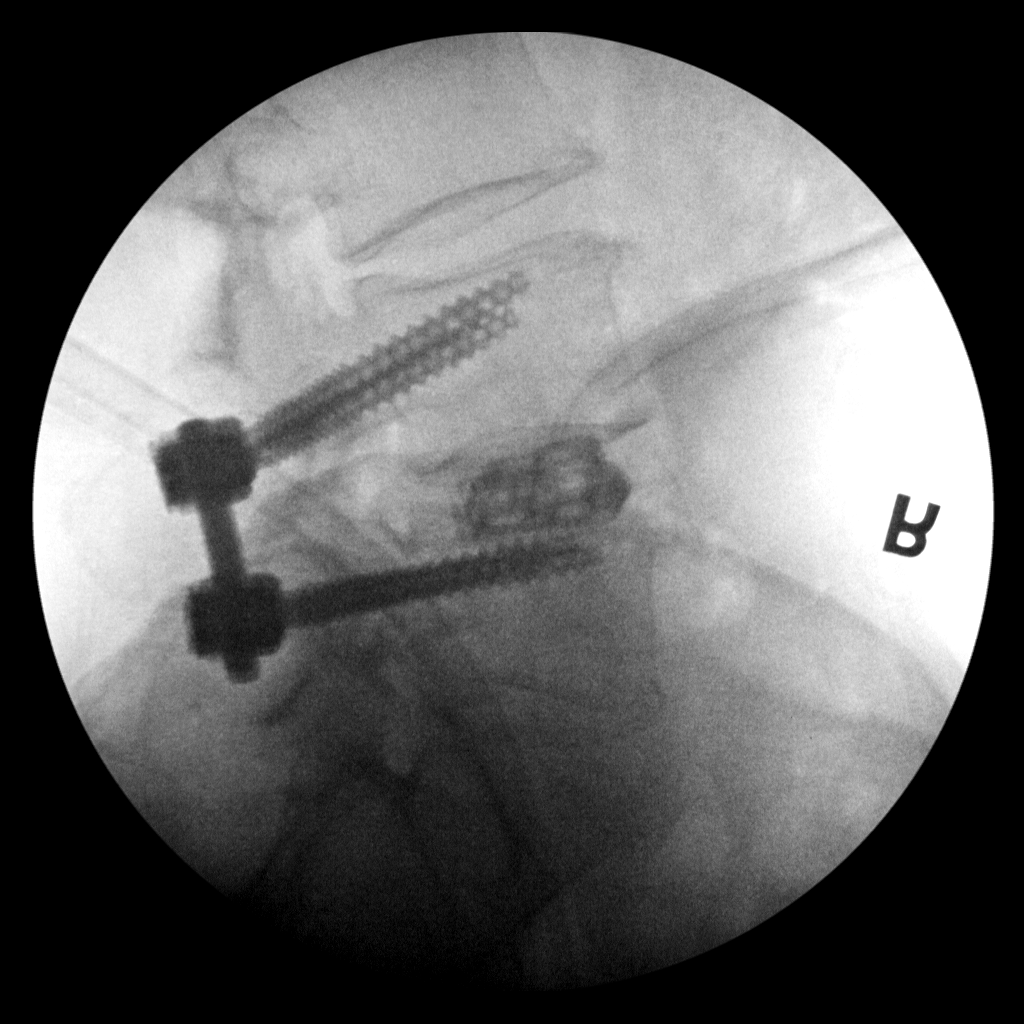
[im 2/4]
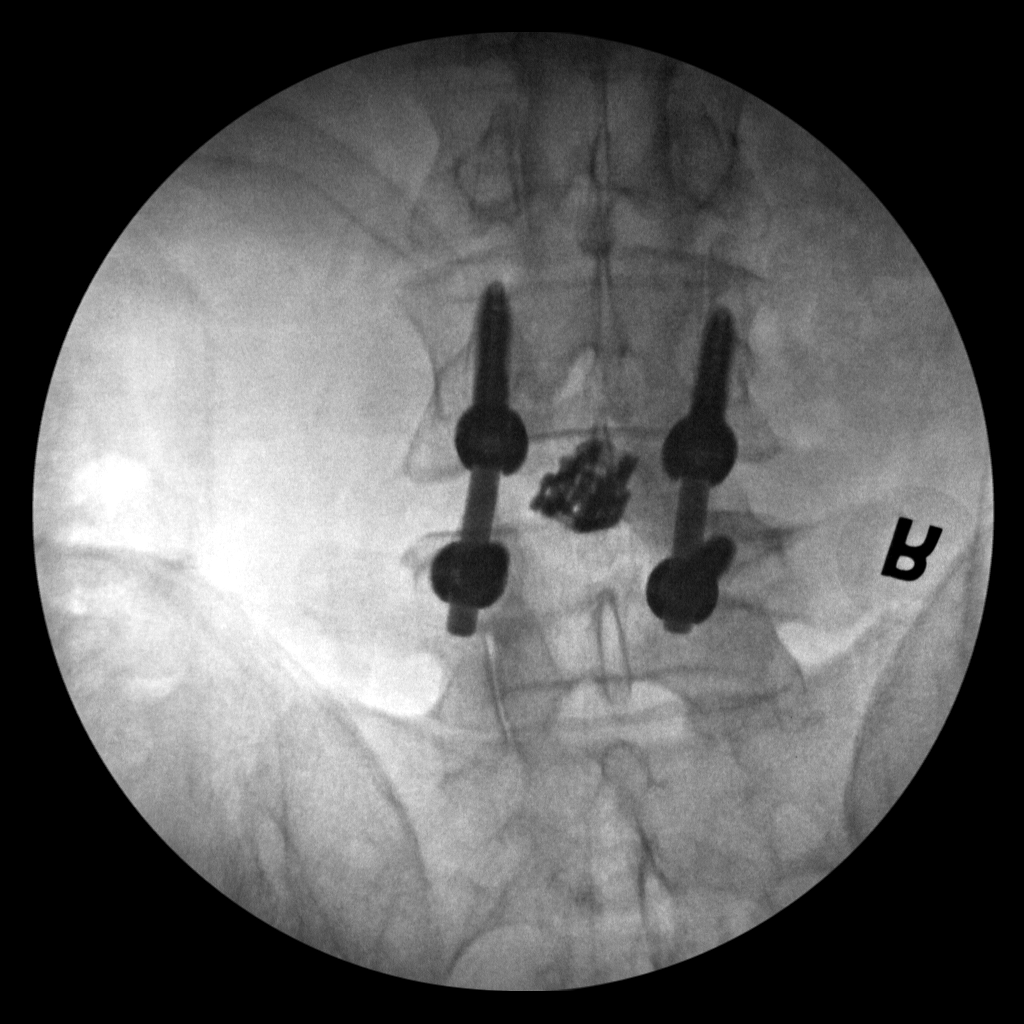
[im 3/4]
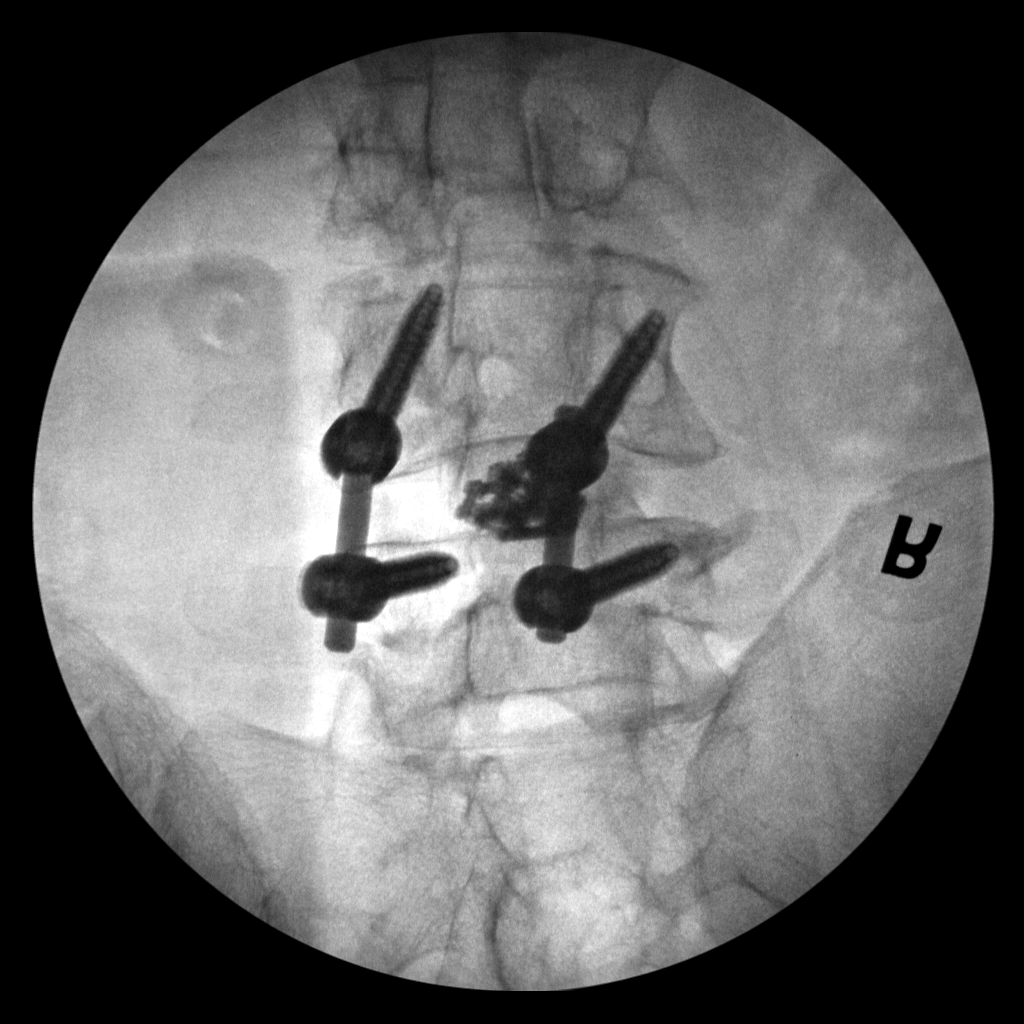
[im 4/4]
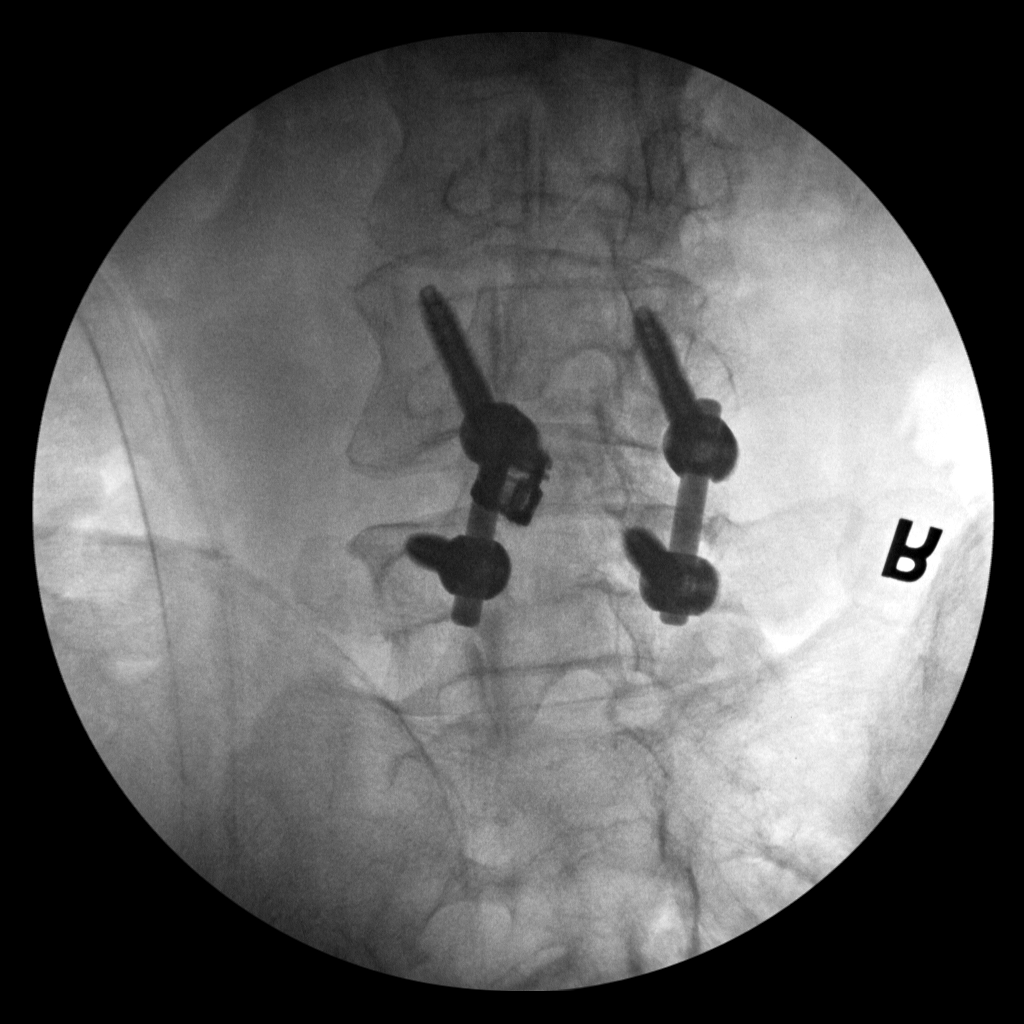

[4 of 4 positions shown; findings below may reference images not displayed]

FINDINGS: Lumbar fluoroscopic images show L4 and L5 fusion with pedicle
screws, rods, and intervertebral graft. No evidence of fracture or
other complicating feature.
IMPRESSION: Fluoroscopy for L4-5 fusion.  No unexpected finding.

## 2021-10-21 IMAGING — RF DG LUMBAR SPINE COMPLETE 4+V
1 series · 4 of 4 positions shown · non-contrast
Comparison: 04/25/2020

CLINICAL DATA: TLIF at L4-5

EXAM:
LUMBAR SPINE - COMPLETE 4+ VIEW; DG C-ARM 1-60 MIN

[Series 1: run · 4 of 4 slices shown]
[im 1/4]
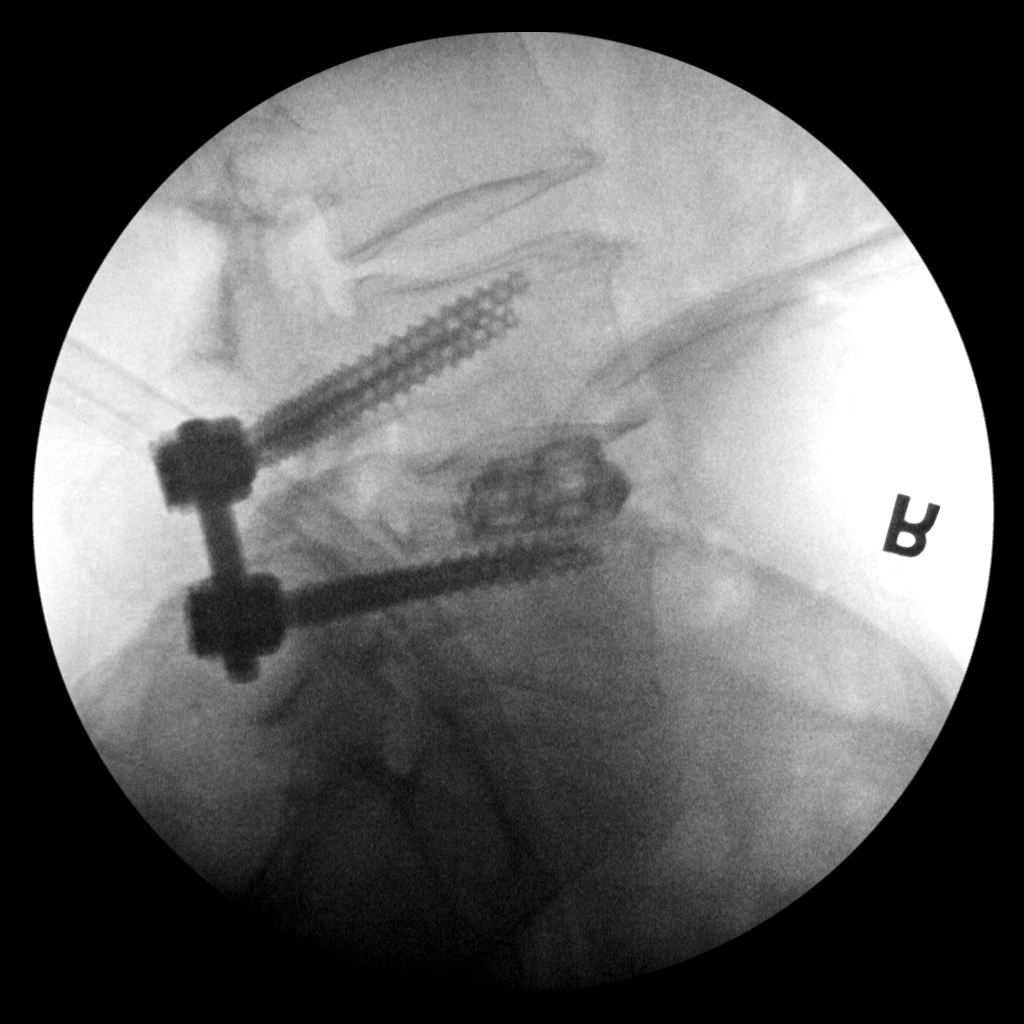
[im 2/4]
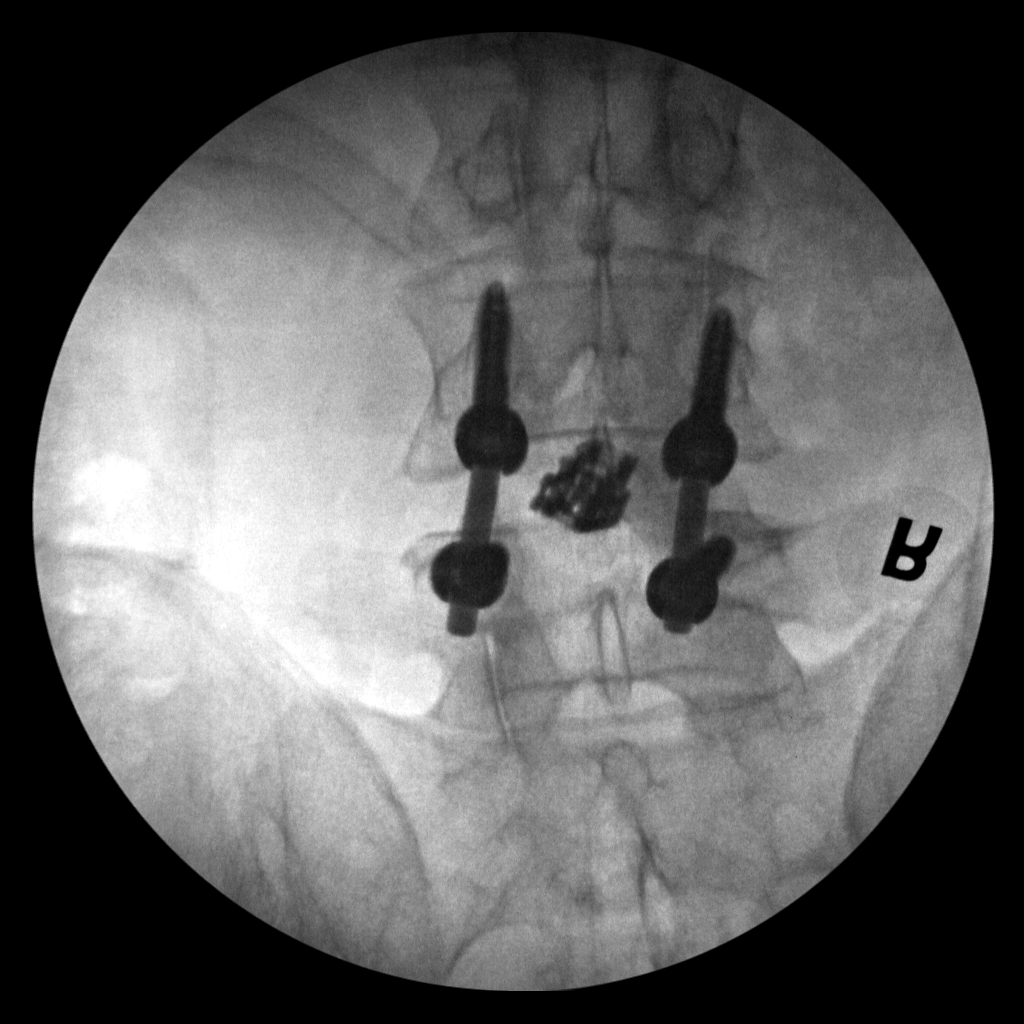
[im 3/4]
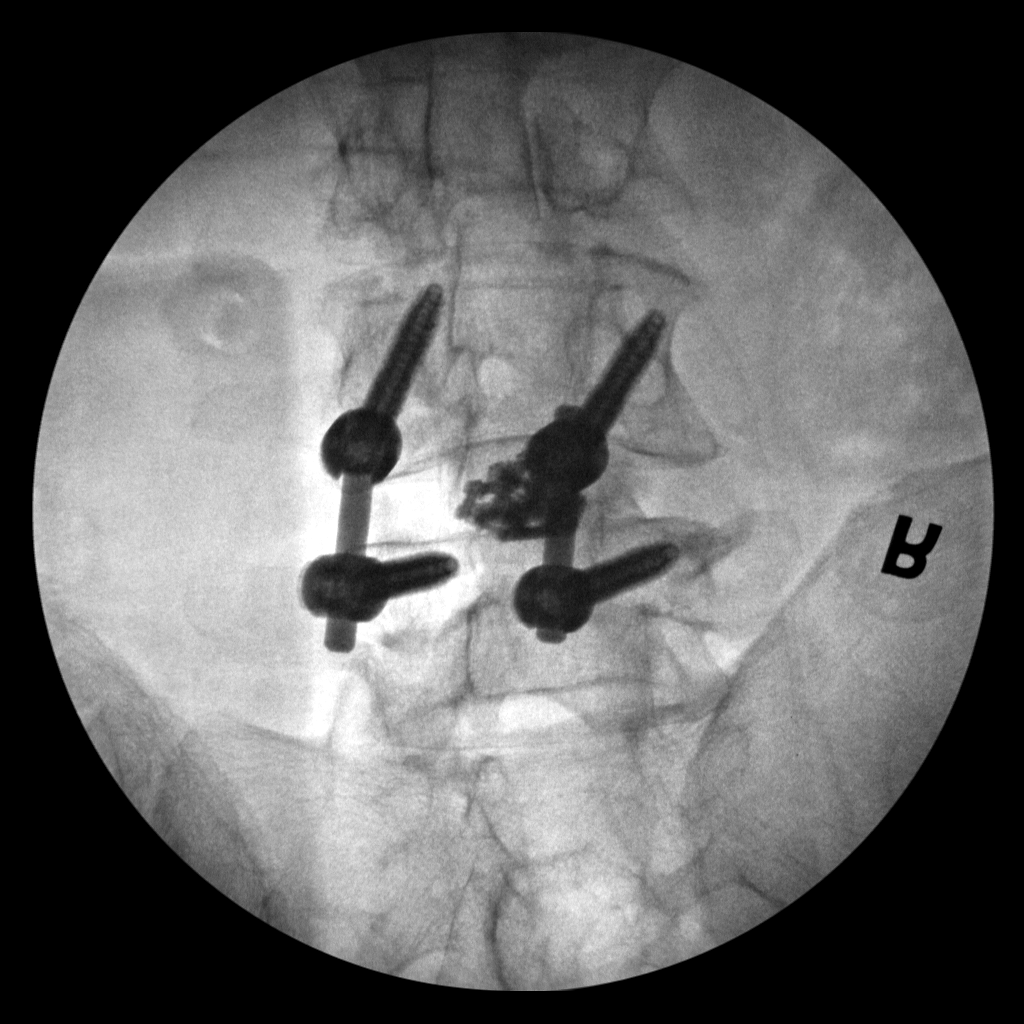
[im 4/4]
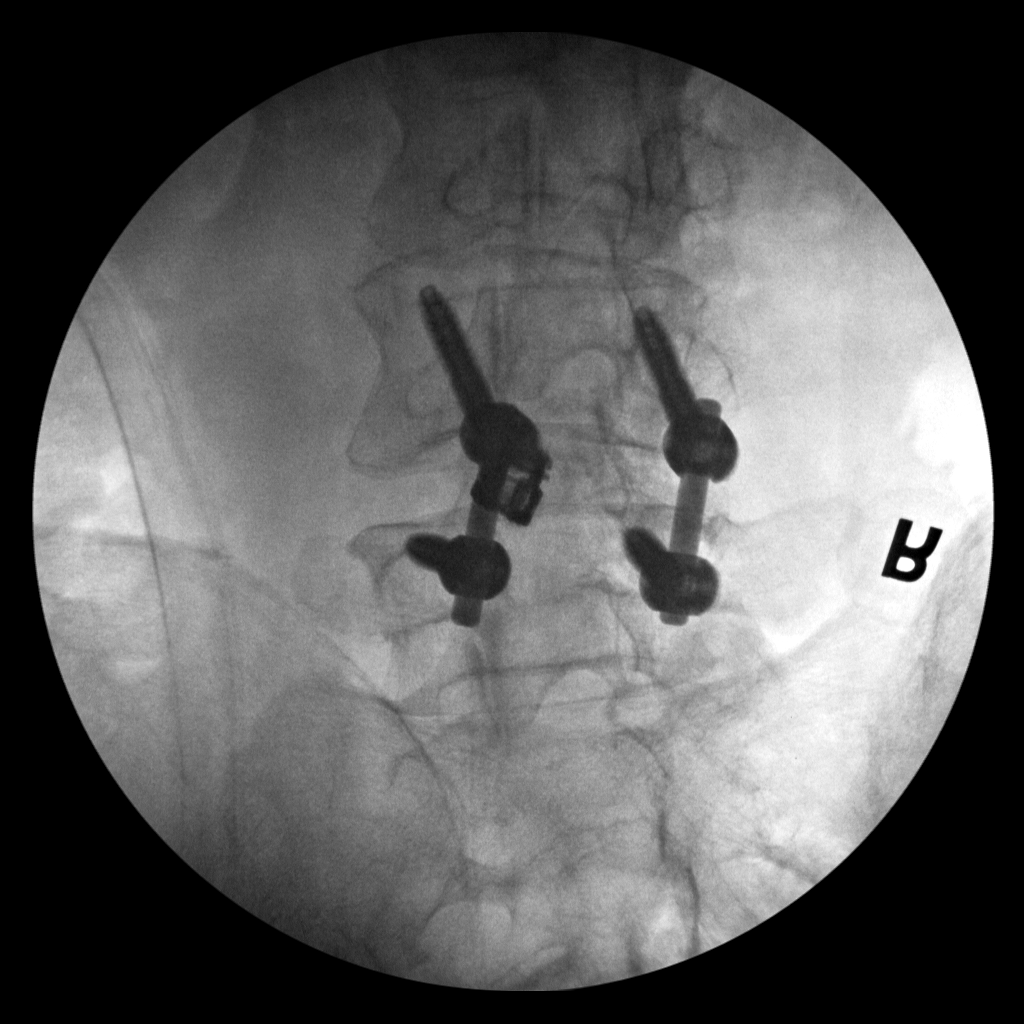

[4 of 4 positions shown; findings below may reference images not displayed]

FINDINGS: Lumbar fluoroscopic images show L4 and L5 fusion with pedicle
screws, rods, and intervertebral graft. No evidence of fracture or
other complicating feature.
IMPRESSION: Fluoroscopy for L4-5 fusion.  No unexpected finding.

## 2021-11-06 NOTE — Progress Notes (Unsigned)
Cardiology Office Note   Date:  11/06/2021   ID:  Patrick Wall, DOB [birth year], MRN [id number]  PCP:  Donita Brooks, MD  Cardiologist:   Charlton Haws, MD   No chief complaint on file.     History of Present Illness: Patrick Wall is a 71 y.o. male first seen March 2021  regarding CAD.Marland Kitchen Paternal aunt died at 69 of MI. Father died suddenly age 48's after snorkeling Patient Himself is active competing in triathlons.  No cardiac symptoms. Currently Taking statin.    He does triathlons when he runs first mile which is hard then feels better Had cath in 2007 for abnormal holter ? 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   Married works in lab. 3 children 2 in [place] and one in [place]  F/U studies reviewed  Calcium score 09/02/18 424 Myovue 09/17/18 normal no ischemia EF 59% TTE EF 60-65% trivial MR Ao root 4.2 cm mean gradient 18 peak 34 DVI 0.41  AVA 1.8 cm2   Had L45 fusion with Dr Otelia Sergeant on 08/20/20   Active but limited by back Did his first du-athelon at [facility] last March 2021  Has no palpitations, chest pain or dyspnea Went to [place] last June   ***   Past Medical History:  Diagnosis Date   GERD (gastroesophageal reflux disease)    tums   Hearing loss    History of kidney stones    Hyperlipidemia    Labral tear of shoulder    Lumbar spinal stenosis     Past Surgical History:  Procedure Laterality Date   BACK SURGERY  08/19/2020   COLONOSCOPY     EYE SURGERY  2000   lasic   MOUTH SURGERY     teeth removed, dental implants placed   TONSILLECTOMY     VASECTOMY  1983     Current Outpatient Medications  Medication Sig Dispense Refill   aspirin EC 81 MG tablet Take 81 mg by mouth daily.     atorvastatin (LIPITOR) 40 MG tablet TAKE 1 TABLET BY MOUTH  DAILY 90 tablet 3   Cholecalciferol (VITAMIN D-3) 125 MCG (5000 UT) TABS Take 5,000 Units by mouth 3 (three) times a week.     diclofenac Sodium  (VOLTAREN) 1 % GEL Apply 4 g topically 4 (four) times daily. 350 g 2   meloxicam (MOBIC) 15 MG tablet TAKE 1 TABLET(15 MG) BY MOUTH DAILY 30 tablet 6   Multiple Vitamin (MULTIVITAMIN) capsule Take 1 capsule by mouth daily.     No current facility-administered medications for this visit.   Facility-Administered Medications Ordered in Other Visits  Medication Dose Route Frequency Provider Last Rate Last Admin   sodium chloride flush (NS) 0.9 % injection 10 mL  10 mL Intravenous PRN Wendall Stade, MD   20 mL at 01/08/21 1535    Allergies:   Sulfa antibiotics    Social History:  The patient  reports that he has never smoked. He has never used smokeless tobacco. He reports current alcohol use of about 9.0 standard drinks per week. He reports that he does not use drugs.   Family History:  The patient's family history includes Arthritis in his maternal grandmother; Cancer in his maternal grandfather, maternal grandmother, and mother; Early death in his father; Heart disease in his mother and paternal grandfather; Hyperlipidemia in his maternal grandmother; Hypertension in his maternal grandmother; Stroke in his paternal grandfather;  Vision loss in his mother.    ROS:  Please see the history of present illness.   Otherwise, review of systems are positive for none.   All other systems are reviewed and negative.    PHYSICAL EXAM: VS:  There were no vitals taken for this visit. , BMI There is no height or weight on file to calculate BMI. Affect appropriate Healthy:  appears stated age HEENT: normal Neck supple with no adenopathy JVP normal bilateral bruits no thyromegaly Lungs clear with no wheezing and good diaphragmatic motion Heart:  S1/S2 AS murmur radiates to carotids , no rub, gallop or click PMI normal Abdomen: benighn, BS positve, no tenderness, no AAA no bruit.  No HSM or HJR Distal pulses intact with no bruits No edema Neuro non-focal Skin warm and dry No muscular  weakness    EKG:  2014 SB rate 45 normal 08/27/18 SR rate 51 normal voltage for LVH SR rate 45 normal  11/06/2021 SR Rate 49 normal voltage for LVH   Recent Labs: 04/30/2021: ALT 20; BUN 20; Creat 1.03; Hemoglobin 14.3; Platelets 171; Potassium 4.6; Sodium 139    Lipid Panel    Component Value Date/Time   CHOL 179 04/30/2021 0839   TRIG 42 04/30/2021 0839   HDL 86 04/30/2021 0839   CHOLHDL 2.1 04/30/2021 0839   VLDL 10 10/15/2016 0832   LDLCALC 81 04/30/2021 0839      Wt Readings from Last 3 Encounters:  08/22/21 199 lb (90.3 kg)  07/31/21 199 lb (90.3 kg)  05/02/21 199 lb (90.3 kg)      Other studies Reviewed: Additional studies/ records that were reviewed today include: notes from primary labs CXR and ECG 2014. Myovue 09/17/18 , echo 09/02/18 calcium score 09/02/18  TTE 07/15/21 Carotid 01/08/21    ASSESSMENT AND PLAN:  1.  CAD Risk:  Calcium score 424 09/13/18 this is 78 th percentile Myovue normal 09/17/18 continue risk factor modification consider *** 2. HLD:  Statin  LDL 81 improved  3. Bradycardia:  Functional due to training effect normal ECG 4. AS:  moderate by echo 07/15/21 f/u this November  5. PFO : not clinically significant observe  6. Back pain:  08/20/20 post fusion L45 f/u Dr Otelia Sergeant  7. Bruits:  He has bilateral bruits likely referred AV murmur Duplex 01/08/21 no dx      Current medicines are reviewed at length with the patient today.  The patient does not have concerns regarding medicines.  The following changes have been made:  no change  Labs/ tests ordered today include:  Echo for AS 07/2022   No orders of the defined types were placed in this encounter.    Disposition:   FU with cardiology in  a year     Signed, Charlton Haws, MD  11/06/2021 8:54 AM    [facility] [address] [place], [place], [place]  [address] Phone: [phone]; Fax: [phone]

## 2021-11-20 ENCOUNTER — Encounter: Payer: Self-pay | Admitting: Specialist

## 2021-11-20 ENCOUNTER — Ambulatory Visit: Payer: BC Managed Care – PPO | Admitting: Cardiovascular Disease

## 2021-11-20 ENCOUNTER — Encounter: Payer: Self-pay | Admitting: Cardiovascular Disease

## 2021-11-20 ENCOUNTER — Other Ambulatory Visit: Payer: Self-pay

## 2021-11-20 VITALS — BP 120/82 | HR 60 | Ht 75.0 in | Wt 204.0 lb

## 2021-11-20 DIAGNOSIS — E782 Mixed hyperlipidemia: Secondary | ICD-10-CM | POA: Diagnosis not present

## 2021-11-20 DIAGNOSIS — I350 Nonrheumatic aortic (valve) stenosis: Secondary | ICD-10-CM

## 2021-11-20 DIAGNOSIS — I2510 Atherosclerotic heart disease of native coronary artery without angina pectoris: Secondary | ICD-10-CM | POA: Diagnosis not present

## 2021-11-20 NOTE — Patient Instructions (Addendum)
Medication Instructions:  ?Your physician recommends that you continue on your current medications as directed. Please refer to the Current Medication list given to you today. ? ?*If you need a refill on your cardiac medications before your next appointment, please call your pharmacy* ? ?Lab Work: ?If you have labs (blood work) drawn today and your tests are completely normal, you will receive your results only by: ?MyChart Message (if you have MyChart) OR ?A paper copy in the mail ?If you have any lab test that is abnormal or we need to change your treatment, we will call you to review the results. ? ? ?Testing/Procedures: ?Your physician has requested that you have an echocardiogram in November. Echocardiography is a painless test that uses sound waves to create images of your heart. It provides your doctor with information about the size and shape of your heart and how well your heart?s chambers and valves are working. This procedure takes approximately one hour. There are no restrictions for this procedure. ? ?Follow-Up: ?At [facility], you and your health needs are our priority.  As part of our continuing mission to provide you with exceptional heart care, we have created designated Provider Care Teams.  These Care Teams include your primary Cardiologist (physician) and Advanced Practice Providers (APPs -  Physician Assistants and Nurse Practitioners) who all work together to provide you with the care you need, when you need it. ? ?We recommend signing up for the patient portal called "MyChart".  Sign up information is provided on this After Visit Summary.  MyChart is used to connect with patients for Virtual Visits (Telemedicine).  Patients are able to view lab/test results, encounter notes, upcoming appointments, etc.  Non-urgent messages can be sent to your provider as well.   ?To learn more about what you can do with MyChart, go to [email or web address].   ? ?Your next appointment:   ?November ? ?The  format for your next appointment:   ?In Person ? ?Provider:   ?Jenkins Rouge, MD { ? ?

## 2021-11-21 ENCOUNTER — Ambulatory Visit: Payer: BC Managed Care – PPO | Admitting: Specialist

## 2021-11-21 ENCOUNTER — Encounter: Payer: Self-pay | Admitting: Specialist

## 2021-11-21 VITALS — BP 111/65 | HR 61 | Ht 75.0 in | Wt 204.0 lb

## 2021-11-21 DIAGNOSIS — M47816 Spondylosis without myelopathy or radiculopathy, lumbar region: Secondary | ICD-10-CM

## 2021-11-21 DIAGNOSIS — M48062 Spinal stenosis, lumbar region with neurogenic claudication: Secondary | ICD-10-CM

## 2021-11-21 DIAGNOSIS — M4316 Spondylolisthesis, lumbar region: Secondary | ICD-10-CM

## 2021-11-21 DIAGNOSIS — M4807 Spinal stenosis, lumbosacral region: Secondary | ICD-10-CM | POA: Diagnosis not present

## 2021-11-21 DIAGNOSIS — M5416 Radiculopathy, lumbar region: Secondary | ICD-10-CM

## 2021-11-21 NOTE — Progress Notes (Signed)
? ?Office Visit Note ?  ?Patient: Patrick Wall           ?Date of Birth: 1950-12-27           ?MRN: [id number] ?Visit Date: 11/21/2021 ?             ?Requested by: Susy Frizzle, MD ?[address] [place],  [place] [address] ?PCP: Susy Frizzle, MD ? ? ?Assessment & Plan: ?Visit Diagnoses:  ?1. Spondylolisthesis of lumbar region   ?2. Spondylosis of lumbar region without myelopathy or radiculopathy   ?3. Spinal stenosis of lumbar region with neurogenic claudication   ?4. Spinal stenosis of lumbosacral region   ?5. Lumbar radiculopathy   ? ? ?Plan: Plan: Avoid bending, stooping and avoid lifting weights greater than 10 lbs. ?Avoid prolong standing and walking. ?Pool walking and swimming and bicycling are good exercises done in flexed position and are better tolerated. ?Avoid frequent bending and stooping  ?No lifting greater than 10 lbs. ?May use ice or moist heat for pain. ?The left L3 nerve compression can be a cause for left knee pain and can also affect thigh muscle strength and cause knee cap pain indirectly. Terminal quad strengthening is helpful. ? ?Follow-Up Instructions: Return in about 4 weeks (around 12/19/2021).  ? ?Orders:  ?No orders of the defined types were placed in this encounter. ? ?No orders of the defined types were placed in this encounter. ? ? ? ? Procedures: ?No procedures performed ? ? ?Clinical Data: ?No additional findings. ? ? ?Subjective: ?Chief Complaint  ?Patient presents with  ? Lower Back - Follow-up  ?  3 months follow up.  S/p fusion 08/2020  ? ? ?HPI ? ?Review of Systems ? ? ?Objective: ?Vital Signs: BP 111/65 (BP Location: Left Arm, Patient Position: Sitting, Cuff Size: Normal)   Pulse 61   Ht 6\' 3"  (1.905 m)   Wt 204 lb (92.5 kg)   BMI 25.50 kg/m?  ? ?Physical Exam ? ?Ortho Exam ? ?Specialty Comments:  ?No specialty comments available. ? ?Imaging: ?No results found. ? ? ?PMFS History: ?Patient Active Problem List  ? Diagnosis Date Noted  ? 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 Spondylolisthesis, lumbar region   ? Lumbar spinal stenosis   ? Hyperlipidemia   ? Hypertension   ? Hearing loss   ? 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 ?Past Medical History:  ?Diagnosis Date  ? GERD (gastroesophageal reflux disease)   ? tums  ? Hearing loss   ? History of kidney stones   ? Hyperlipidemia   ? Labral tear of shoulder   ? Lumbar spinal stenosis   ?  ?Family History  ?Problem Relation Age of Onset  ? Cancer Mother   ?     skin  ? Heart disease Mother   ? Vision loss Mother   ? Early death Father   ? Arthritis Maternal Grandmother   ? Cancer Maternal Grandmother   ? Hyperlipidemia Maternal Grandmother   ? Hypertension Maternal Grandmother   ? Cancer Maternal Grandfather   ? Heart disease Paternal Grandfather   ? Stroke Paternal Grandfather   ?  ?Past Surgical History:  ?Procedure Laterality Date  ? BACK SURGERY  08/19/2020  ? COLONOSCOPY    ? EYE SURGERY  2000  ? lasic  ? MOUTH SURGERY    ? teeth removed, dental implants placed  ? TONSILLECTOMY    ? VASECTOMY  1983  ? ?Social History  ? ?  Occupational History  ? Not on file  ?Tobacco Use  ? Smoking status: Never  ? Smokeless tobacco: Never  ?Vaping Use  ? Vaping Use: Never used  ?Substance and Sexual Activity  ? Alcohol use: Yes  ?  Alcohol/week: 9.0 standard drinks  ?  Types: 5 Glasses of wine, 4 Cans of beer per week  ? Drug use: No  ? Sexual activity: Yes  ?  Birth control/protection: Surgical  ?  Comment: vasectomy  ? ? ? ? ? ? ?

## 2021-11-21 NOTE — Patient Instructions (Addendum)
?  Plan: Avoid bending, stooping and avoid lifting weights greater than 10 lbs. ?Avoid prolong standing and walking. ?Pool walking and swimming and bicycling are good exercises done in flexed position and are better tolerated. ?Avoid frequent bending and stooping  ?No lifting greater than 10 lbs. ?May use ice or moist heat for pain. ?The left L3 nerve compression can be a cause for left knee pain and can also affect thigh muscle strength and cause knee cap pain indirectly. Terminal quad strengthening is helpful. ?

## 2021-12-16 DIAGNOSIS — H35372 Puckering of macula, left eye: Secondary | ICD-10-CM | POA: Diagnosis not present

## 2021-12-16 DIAGNOSIS — H35371 Puckering of macula, right eye: Secondary | ICD-10-CM | POA: Diagnosis not present

## 2021-12-16 DIAGNOSIS — H25013 Cortical age-related cataract, bilateral: Secondary | ICD-10-CM | POA: Diagnosis not present

## 2021-12-16 DIAGNOSIS — H35411 Lattice degeneration of retina, right eye: Secondary | ICD-10-CM | POA: Diagnosis not present

## 2022-03-26 ENCOUNTER — Other Ambulatory Visit: Payer: Self-pay | Admitting: Family Medicine

## 2022-03-27 NOTE — Telephone Encounter (Signed)
Requested Prescriptions  Pending Prescriptions Disp Refills  . atorvastatin (LIPITOR) 40 MG tablet [Pharmacy Med Name: Atorvastatin Calcium 40 MG Oral Tablet] 90 tablet 0    Sig: TAKE 1 TABLET BY MOUTH  DAILY     Cardiovascular:  Antilipid - Statins Failed - 03/26/2022 10:02 PM      Failed - Valid encounter within last 12 months    Recent Outpatient Visits          10 months ago Encounter for Medicare annual wellness exam   [facility] Pickard, Priscille Heidelberg, MD   10 months ago Encounter for [employer] annual wellness exam   [facility] Pickard, Priscille Heidelberg, MD   1 year ago Physical exam, annual   [facility] Donita Brooks, MD   2 years ago Pharyngitis, unspecified etiology   [facility] Elmore Guise, FNP   2 years ago Physical exam, annual   [facility] Pickard, Priscille Heidelberg, MD      Future Appointments            In 1 month Pickard, Priscille Heidelberg, MD [facility]           Failed - Lipid Panel in normal range within the last 12 months    Cholesterol  Date Value Ref Range Status  04/30/2021 179 <200 mg/dL Final   LDL Cholesterol (Calc)  Date Value Ref Range Status  04/30/2021 81 mg/dL (calc) Final    Comment:    Reference range: <100 . Desirable range <100 mg/dL for primary prevention;   <70 mg/dL for patients with CHD or diabetic patients  with > or = 2 CHD risk factors. Marland Kitchen LDL-C is now calculated using the Martin-Hopkins  calculation, which is a validated novel method providing  better accuracy than the Friedewald equation in the  estimation of LDL-C.  Horald Pollen et al. Lenox Ahr. 6213;086(57): 2061-2068  (http://education.[facility].com/faq/FAQ164)    HDL  Date Value Ref Range Status  04/30/2021 86 > OR = 40 mg/dL Final   Triglycerides  Date Value Ref Range Status  04/30/2021 42 <150 mg/dL Final         Passed - Patient is not pregnant

## 2022-04-07 ENCOUNTER — Telehealth: Payer: Self-pay

## 2022-04-07 NOTE — Telephone Encounter (Signed)
Called pt, LM for pt to come in to see Dr. Tanya Nones tom at 11:15 due to feeling dizzie/foggy

## 2022-04-07 NOTE — Telephone Encounter (Signed)
Pt LM re he has been feeling dizzy/brain fog. Pt stated that he has been feeling like this for the last 10 days. He stated that he only feels like this when he gets up or stands up. He said that he is fine once he is sitting.   Pt stated that he has no other symptoms, ie chest pain, SOB, tingling, etc. Pt stated that he is still able to do some of his activities.   Suggest pt to check his blood pressure. Drink lots of fluids along with a few crackers. Suggest pt to go UC if he develops other symptoms or feels worst. Pt voiced understanding.

## 2022-04-07 NOTE — Telephone Encounter (Signed)
Pt called back w/bp readings  Before hydrating 125/82-hr @ 49  1 hr later after hydrating 128/81 -hr @ 46  Per pt still feels foggy. Advice pt to go to UC to get checked out. Pt voiced understanding and stated "ok"

## 2022-04-08 ENCOUNTER — Ambulatory Visit: Payer: BC Managed Care – PPO | Admitting: Family Medicine

## 2022-04-08 VITALS — BP 118/60 | HR 43 | Temp 97.7°F | Ht 75.0 in | Wt 203.2 lb

## 2022-04-08 DIAGNOSIS — D519 Vitamin B12 deficiency anemia, unspecified: Secondary | ICD-10-CM | POA: Diagnosis not present

## 2022-04-08 DIAGNOSIS — R413 Other amnesia: Secondary | ICD-10-CM | POA: Diagnosis not present

## 2022-04-08 DIAGNOSIS — E785 Hyperlipidemia, unspecified: Secondary | ICD-10-CM | POA: Diagnosis not present

## 2022-04-08 NOTE — Telephone Encounter (Signed)
Spoke w/pt this morning. Aware of appt and will be here.

## 2022-04-08 NOTE — Progress Notes (Signed)
Subjective:    Patient ID: Patrick Wall, male    DOB: 07/24/1951, 71 y.o.   MRN: [id number]  Dizziness   Patient presents today complaining of feeling foggy.  He states that this started about 10 days ago.  He reports decrease ability to concentrate, decreased ability to focus, some mild memory loss, and just feeling discombobulated slightly.  He denies any head trauma.  He denies any recent concussion.  He denies any fevers or chills.  He denies any headaches.  He denies any strokelike symptoms.  Cranial nerves II through XII are grossly intact muscle strength 5/5 equal and symmetric bilaterally.  Romberg testing is normal.  Finger-nose testing is normal.  Heel-to-toe walk is normal.  He does report some mild memory loss over the last week such as having trouble remembering where he placed items.  He denies any medications.  He is not taking any type of antihistamines.  He is not taking type of pain medication or sleep aid.  He denies any signs of infection.  Specifically he denies any headache or stiff neck or sinus pain or otalgia or sore throat or cough or nausea or vomiting or dysuria.  There is no rash.  There is no obvious stroke.  He has carbon monoxide monitors at home and these are functioning and has not been alerted.  He did recently compete in an Ironman where he was swimming [place] a [place] in [place].   Past Medical History:  Diagnosis Date   GERD (gastroesophageal reflux disease)    tums   Hearing loss    History of kidney stones    Hyperlipidemia    Labral tear of shoulder    Lumbar spinal stenosis    Past Surgical History:  Procedure Laterality Date   BACK SURGERY  08/19/2020   COLONOSCOPY     EYE SURGERY  2000   lasic   MOUTH SURGERY     teeth removed, dental implants placed   TONSILLECTOMY     VASECTOMY  1983   Current Outpatient Medications on File Prior to Visit  Medication Sig Dispense Refill   aspirin EC 81 MG tablet Take 81 mg by mouth daily.      atorvastatin (LIPITOR) 40 MG tablet TAKE 1 TABLET BY MOUTH  DAILY 90 tablet 0   Cholecalciferol (VITAMIN D-3) 125 MCG (5000 UT) TABS Take 5,000 Units by mouth 3 (three) times a week.     Multiple Vitamin (MULTIVITAMIN) capsule Take 1 capsule by mouth daily.     Current Facility-Administered Medications on File Prior to Visit  Medication Dose Route Frequency Provider Last Rate Last Admin   sodium chloride flush (NS) 0.9 % injection 10 mL  10 mL Intravenous PRN Wendall Stade, MD   20 mL at 01/08/21 1535   Allergies  Allergen Reactions   Sulfa Antibiotics     Unknown reaction   Social History   Socioeconomic History   Marital status: Married    Spouse name: Not on file   Number of children: Not on file   Years of education: Not on file   Highest education level: Not on file  Occupational History   Not on file  Tobacco Use   Smoking status: Never   Smokeless tobacco: Never  Vaping Use   Vaping Use: Never used  Substance and Sexual Activity   Alcohol use: Yes    Alcohol/week: 9.0 standard drinks of alcohol    Types: 5 Glasses of wine, 4 Cans  of beer per week   Drug use: No   Sexual activity: Yes    Birth control/protection: Surgical    Comment: vasectomy  Other Topics Concern   Not on file  Social History Narrative   Not on file   Social Determinants of Health   Financial Resource Strain: Low Risk  (02/15/2021)   Overall Financial Resource Strain (CARDIA)    Difficulty of Paying Living Expenses: Not hard at all  Food Insecurity: No Food Insecurity (02/15/2021)   Hunger Vital Sign    Worried About Running Out of Food in the Last Year: Never true    Ran Out of Food in the Last Year: Never true  Transportation Needs: No Transportation Needs (02/15/2021)   PRAPARE - Administrator, Civil Service (Medical): No    Lack of Transportation (Non-Medical): No  Physical Activity: Sufficiently Active (02/15/2021)   Exercise Vital Sign    Days of Exercise per Week: 7  days    Minutes of Exercise per Session: 60 min  Stress: No Stress Concern Present (02/15/2021)   [employer] of Occupational Health - Occupational Stress Questionnaire    Feeling of Stress : Not at all  Social Connections: Moderately Isolated (02/15/2021)   Social Connection and Isolation Panel [NHANES]    Frequency of Communication with Friends and Family: Once a week    Frequency of Social Gatherings with Friends and Family: Once a week    Attends Religious Services: Never    Database administrator or Organizations: Yes    Attends Engineer, structural: More than 4 times per year    Marital Status: Married  Catering manager Violence: Not At Risk (02/15/2021)   Humiliation, Afraid, Rape, and Kick questionnaire    Fear of Current or Ex-Partner: No    Emotionally Abused: No    Physically Abused: No    Sexually Abused: No   Family History  Problem Relation Age of Onset   Cancer Mother        skin   Heart disease Mother    Vision loss Mother    Early death Father    Arthritis Maternal Grandmother    Cancer Maternal Grandmother    Hyperlipidemia Maternal Grandmother    Hypertension Maternal Grandmother    Cancer Maternal Grandfather    Heart disease Paternal Grandfather    Stroke Paternal Grandfather       Review of Systems  Neurological:  Positive for dizziness.  All other systems reviewed and are negative.      Objective:   Physical Exam Vitals reviewed.  Constitutional:      General: He is not in acute distress.    Appearance: He is well-developed. He is not diaphoretic.  HENT:     Head: Normocephalic and atraumatic.     Right Ear: External ear normal.     Left Ear: External ear normal.     Nose: Nose normal.     Mouth/Throat:     Pharynx: No oropharyngeal exudate.  Eyes:     General: No scleral icterus.       Right eye: No discharge.        Left eye: No discharge.     Conjunctiva/sclera: Conjunctivae normal.     Pupils: Pupils are equal,  round, and reactive to light.  Neck:     Thyroid: No thyromegaly.     Vascular: Carotid bruit present. No JVD.     Trachea: No tracheal deviation.  Cardiovascular:  Rate and Rhythm: Normal rate and regular rhythm.     Heart sounds: Murmur heard.     No friction rub. No gallop.  Pulmonary:     Effort: Pulmonary effort is normal. No respiratory distress.     Breath sounds: Normal breath sounds. No wheezing or rales.  Chest:     Chest wall: No tenderness.  Abdominal:     General: Bowel sounds are normal. There is no distension.     Palpations: Abdomen is soft. There is no mass.     Tenderness: There is no abdominal tenderness. There is no guarding or rebound.  Musculoskeletal:     Cervical back: Normal range of motion and neck supple.  Lymphadenopathy:     Cervical: No cervical adenopathy.  Skin:    General: Skin is warm.     Coloration: Skin is not pale.     Findings: No erythema or rash.  Neurological:     Mental Status: He is alert and oriented to person, place, and time.     Cranial Nerves: No cranial nerve deficit.     Motor: No abnormal muscle tone.     Coordination: Coordination normal.     Deep Tendon Reflexes: Reflexes normal.  Psychiatric:        Behavior: Behavior normal.        Thought Content: Thought content normal.        Judgment: Judgment normal.           Assessment & Plan:  Memory loss or impairment - Plan: CBC with Differential/Platelet, COMPLETE METABOLIC PANEL WITH GFR, Vitamin B12, Lipid panel, TSH Hemoglobin returned, the patient reports mild confusion and discombobulated shin as well as some mild memory impairment.  However his physical exam today is completely normal.  He denies any neurologic deficits or seizure activity.  His neurologic exam is normal.  Begin by obtaining some basic lab work including a CBC, CMP, B12, lipid panel, and a TSH.  If lab work is normal, we may just monitor the situation to see if he gradually improves.  If worsening  I will definitely recommend neuroimaging to rule out something such as a lacunar infarct.  Patient does have a history of a PFO and given his recent travel for the Iron[place] potentially could have had a small embolic event.  He also has a history of moderate aortic stenosis but based on his clinical exam this has not worsened

## 2022-04-09 LAB — CBC WITH DIFFERENTIAL/PLATELET
Absolute Monocytes: 330 cells/uL (ref 200–950)
Basophils Absolute: 10 cells/uL (ref 0–200)
Basophils Relative: 0.3 %
Eosinophils Absolute: 51 cells/uL (ref 15–500)
Eosinophils Relative: 1.6 %
HCT: 42.7 % (ref 38.5–50.0)
Hemoglobin: 15 g/dL (ref 13.2–17.1)
Lymphs Abs: 1424 cells/uL (ref 850–3900)
MCH: 32.4 pg (ref 27.0–33.0)
MCHC: 35.1 g/dL (ref 32.0–36.0)
MCV: 92.2 fL (ref 80.0–100.0)
MPV: 11.5 fL (ref 7.5–12.5)
Monocytes Relative: 10.3 %
Neutro Abs: 1386 cells/uL — ABNORMAL LOW (ref 1500–7800)
Neutrophils Relative %: 43.3 %
Platelets: 160 10*3/uL (ref 140–400)
RBC: 4.63 10*6/uL (ref 4.20–5.80)
RDW: 12.8 % (ref 11.0–15.0)
Total Lymphocyte: 44.5 %
WBC: 3.2 10*3/uL — ABNORMAL LOW (ref 3.8–10.8)

## 2022-04-09 LAB — LIPID PANEL
Cholesterol: 199 mg/dL (ref <200)
HDL: 103 mg/dL (ref >=40)
LDL Cholesterol (Calc): 82 mg/dL (calc)
Non-HDL Cholesterol (Calc): 96 mg/dL (calc) (ref <130)
Total CHOL/HDL Ratio: 1.9 (calc) (ref <5.0)
Triglycerides: 59 mg/dL (ref <150)

## 2022-04-09 LAB — COMPLETE METABOLIC PANEL WITH GFR
AG Ratio: 2.2 (calc) (ref 1.0–2.5)
ALT: 23 U/L (ref 9–46)
AST: 26 U/L (ref 10–35)
Albumin: 4.7 g/dL (ref 3.6–5.1)
Alkaline phosphatase (APISO): 36 U/L (ref 35–144)
BUN: 23 mg/dL (ref 7–25)
CO2: 27 mmol/L (ref 20–32)
Calcium: 10.1 mg/dL (ref 8.6–10.3)
Chloride: 104 mmol/L (ref 98–110)
Creat: 1.06 mg/dL (ref 0.70–1.35)
Globulin: 2.1 g/dL (calc) (ref 1.9–3.7)
Glucose, Bld: 97 mg/dL (ref 65–99)
Potassium: 4.8 mmol/L (ref 3.5–5.3)
Sodium: 138 mmol/L (ref 135–146)
Total Bilirubin: 1.1 mg/dL (ref 0.2–1.2)
Total Protein: 6.8 g/dL (ref 6.1–8.1)
eGFR: 76 mL/min/{1.73_m2} (ref >=60)

## 2022-04-09 LAB — VITAMIN B12: Vitamin B-12: 298 pg/mL (ref 200–1100)

## 2022-04-09 LAB — TSH: TSH: 2.38 mIU/L (ref 0.40–4.50)

## 2022-04-14 ENCOUNTER — Ambulatory Visit: Payer: BC Managed Care – PPO | Admitting: Family Medicine

## 2022-05-01 ENCOUNTER — Other Ambulatory Visit: Payer: BC Managed Care – PPO

## 2022-05-06 ENCOUNTER — Ambulatory Visit (INDEPENDENT_AMBULATORY_CARE_PROVIDER_SITE_OTHER): Payer: BC Managed Care – PPO | Admitting: Family Medicine

## 2022-05-06 VITALS — BP 114/60 | HR 55 | Temp 98.2°F | Ht 75.0 in | Wt 203.2 lb

## 2022-05-06 DIAGNOSIS — Z1211 Encounter for screening for malignant neoplasm of colon: Secondary | ICD-10-CM | POA: Diagnosis not present

## 2022-05-06 DIAGNOSIS — Z0000 Encounter for general adult medical examination without abnormal findings: Secondary | ICD-10-CM | POA: Diagnosis not present

## 2022-05-06 DIAGNOSIS — Q2112 Patent foramen ovale: Secondary | ICD-10-CM

## 2022-05-06 DIAGNOSIS — Z125 Encounter for screening for malignant neoplasm of prostate: Secondary | ICD-10-CM

## 2022-05-06 DIAGNOSIS — Z1283 Encounter for screening for malignant neoplasm of skin: Secondary | ICD-10-CM

## 2022-05-06 NOTE — Progress Notes (Signed)
Subjective:    Patient ID: Patrick Wall, male    DOB: Aug 13, 1951, 71 y.o.   MRN: [id number]  HPI Patient is a very pleasant 71 year old Caucasian gentleman here today for complete physical exam.  He is due for a flu shot, COVID booster when they come out.  He is also due for Shingrix.  His last colonoscopy was in 2014.  He is not due again until next year but he would like to go ahead and do Cologuard.  He is due for prostate cancer screening with a PSA.  He recently had blood work due to "mental fog".  Labs are outstanding except for borderline low B12.  Thankfully the "mental fog" has resolved.  He states that he feels back to normal.  We discussed performing an MRI to evaluate for a TIA or lacunar infarct but at the present time, the patient defers this. Office Visit on 04/08/2022  Component Date Value Ref Range Status   WBC 04/08/2022 3.2 (L)  3.8 - 10.8 Thousand/uL Final   RBC 04/08/2022 4.63  4.20 - 5.80 Million/uL Final   Hemoglobin 04/08/2022 15.0  13.2 - 17.1 g/dL Final   HCT 04/08/2022 42.7  38.5 - 50.0 % Final   MCV 04/08/2022 92.2  80.0 - 100.0 fL Final   MCH 04/08/2022 32.4  27.0 - 33.0 pg Final   MCHC 04/08/2022 35.1  32.0 - 36.0 g/dL Final   RDW 04/08/2022 12.8  11.0 - 15.0 % Final   Platelets 04/08/2022 160  140 - 400 Thousand/uL Final   MPV 04/08/2022 11.5  7.5 - 12.5 fL Final   Neutro Abs 04/08/2022 1,386 (L)  1,500 - 7,800 cells/uL Final   Lymphs Abs 04/08/2022 1,424  850 - 3,900 cells/uL Final   Absolute Monocytes 04/08/2022 330  200 - 950 cells/uL Final   Eosinophils Absolute 04/08/2022 51  15 - 500 cells/uL Final   Basophils Absolute 04/08/2022 10  0 - 200 cells/uL Final   Neutrophils Relative % 04/08/2022 43.3  % Final   Total Lymphocyte 04/08/2022 44.5  % Final   Monocytes Relative 04/08/2022 10.3  % Final   Eosinophils Relative 04/08/2022 1.6  % Final   Basophils Relative 04/08/2022 0.3  % Final   Glucose, Bld 04/08/2022 97  65 - 99 mg/dL Final   Comment: .             Fasting reference interval .    BUN 04/08/2022 23  7 - 25 mg/dL Final   Creat 04/08/2022 1.06  0.70 - 1.35 mg/dL Final   eGFR 04/08/2022 76  > OR = 60 mL/min/1.63m2 Final   BUN/Creatinine Ratio 04/08/2022 SEE NOTE:  6 - 22 (calc) Final   Comment:    Not Reported: BUN and Creatinine are within    reference range. .    Sodium 04/08/2022 138  135 - 146 mmol/L Final   Potassium 04/08/2022 4.8  3.5 - 5.3 mmol/L Final   Chloride 04/08/2022 104  98 - 110 mmol/L Final   CO2 04/08/2022 27  20 - 32 mmol/L Final   Calcium 04/08/2022 10.1  8.6 - 10.3 mg/dL Final   Total Protein 04/08/2022 6.8  6.1 - 8.1 g/dL Final   Albumin 04/08/2022 4.7  3.6 - 5.1 g/dL Final   Globulin 04/08/2022 2.1  1.9 - 3.7 g/dL (calc) Final   AG Ratio 04/08/2022 2.2  1.0 - 2.5 (calc) Final   Total Bilirubin 04/08/2022 1.1  0.2 - 1.2 mg/dL Final   Alkaline phosphatase (  APISO) 04/08/2022 36  35 - 144 U/L Final   AST 04/08/2022 26  10 - 35 U/L Final   ALT 04/08/2022 23  9 - 46 U/L Final   Vitamin B-12 04/08/2022 298  200 - 1,100 pg/mL Final   Comment: . Please Note: Although the reference range for vitamin B12 is [phone] pg/mL, it has been reported that between 5 and 10% of patients with values between 200 and 400 pg/mL may experience neuropsychiatric and hematologic abnormalities due to occult B12 deficiency; less than 1% of patients with values above 400 pg/mL will have symptoms. .    Cholesterol 04/08/2022 199  <200 mg/dL Final   HDL 04/08/2022 103  > OR = 40 mg/dL Final   Triglycerides 04/08/2022 59  <150 mg/dL Final   LDL Cholesterol (Calc) 04/08/2022 82  mg/dL (calc) Final   Comment: Reference range: <100 . Desirable range <100 mg/dL for primary prevention;   <70 mg/dL for patients with CHD or diabetic patients  with > or = 2 CHD risk factors. Marland Kitchen LDL-C is now calculated using the Martin-Hopkins  calculation, which is a validated novel method providing  better accuracy than the Friedewald equation in  the  estimation of LDL-C.  Cresenciano Genre et al. Annamaria Helling. 2500;370(48): 2061-2068  (http://education.[facility].com/faq/FAQ164)    Total CHOL/HDL Ratio 04/08/2022 1.9  <5.0 (calc) Final   Non-HDL Cholesterol (Calc) 04/08/2022 96  <130 mg/dL (calc) Final   Comment: For patients with diabetes plus 1 major ASCVD risk  factor, treating to a non-HDL-C goal of <100 mg/dL  (LDL-C of <70 mg/dL) is considered a therapeutic  option.    TSH 04/08/2022 2.38  0.40 - 4.50 mIU/L Final    Immunization History  Administered Date(s) Administered   Fluad Quad(high Dose 65+) 07/03/2020   Influenza Split 05/21/2015   Influenza,inj,Quad PF,6+ Mos 05/02/2021   Influenza-Unspecified 06/02/2016, 05/18/2017, 05/24/2018, 05/27/2019   PFIZER Comirnaty(Gray Top)Covid-19 Tri-Sucrose Vaccine 01/15/2021   PFIZER(Purple Top)SARS-COV-2 Vaccination 09/03/2019, 09/23/2019, 05/27/2020   Pneumococcal Conjugate-13 07/09/2017   Pneumococcal Polysaccharide-23 09/19/2014, 04/30/2020   Tdap 04/01/2014   Zoster, Live 03/07/2014    No visits with results within 1 Week(s) from this visit.  Latest known visit with results is:  Office Visit on 04/08/2022  Component Date Value Ref Range Status   WBC 04/08/2022 3.2 (L)  3.8 - 10.8 Thousand/uL Final   RBC 04/08/2022 4.63  4.20 - 5.80 Million/uL Final   Hemoglobin 04/08/2022 15.0  13.2 - 17.1 g/dL Final   HCT 04/08/2022 42.7  38.5 - 50.0 % Final   MCV 04/08/2022 92.2  80.0 - 100.0 fL Final   MCH 04/08/2022 32.4  27.0 - 33.0 pg Final   MCHC 04/08/2022 35.1  32.0 - 36.0 g/dL Final   RDW 04/08/2022 12.8  11.0 - 15.0 % Final   Platelets 04/08/2022 160  140 - 400 Thousand/uL Final   MPV 04/08/2022 11.5  7.5 - 12.5 fL Final   Neutro Abs 04/08/2022 1,386 (L)  1,500 - 7,800 cells/uL Final   Lymphs Abs 04/08/2022 1,424  850 - 3,900 cells/uL Final   Absolute Monocytes 04/08/2022 330  200 - 950 cells/uL Final   Eosinophils Absolute 04/08/2022 51  15 - 500 cells/uL Final   Basophils  Absolute 04/08/2022 10  0 - 200 cells/uL Final   Neutrophils Relative % 04/08/2022 43.3  % Final   Total Lymphocyte 04/08/2022 44.5  % Final   Monocytes Relative 04/08/2022 10.3  % Final   Eosinophils Relative 04/08/2022 1.6  % Final  Basophils Relative 04/08/2022 0.3  % Final   Glucose, Bld 04/08/2022 97  65 - 99 mg/dL Final   Comment: .            Fasting reference interval .    BUN 04/08/2022 23  7 - 25 mg/dL Final   Creat 04/08/2022 1.06  0.70 - 1.35 mg/dL Final   eGFR 04/08/2022 76  > OR = 60 mL/min/1.9m Final   BUN/Creatinine Ratio 04/08/2022 SEE NOTE:  6 - 22 (calc) Final   Comment:    Not Reported: BUN and Creatinine are within    reference range. .    Sodium 04/08/2022 138  135 - 146 mmol/L Final   Potassium 04/08/2022 4.8  3.5 - 5.3 mmol/L Final   Chloride 04/08/2022 104  98 - 110 mmol/L Final   CO2 04/08/2022 27  20 - 32 mmol/L Final   Calcium 04/08/2022 10.1  8.6 - 10.3 mg/dL Final   Total Protein 04/08/2022 6.8  6.1 - 8.1 g/dL Final   Albumin 04/08/2022 4.7  3.6 - 5.1 g/dL Final   Globulin 04/08/2022 2.1  1.9 - 3.7 g/dL (calc) Final   AG Ratio 04/08/2022 2.2  1.0 - 2.5 (calc) Final   Total Bilirubin 04/08/2022 1.1  0.2 - 1.2 mg/dL Final   Alkaline phosphatase (APISO) 04/08/2022 36  35 - 144 U/L Final   AST 04/08/2022 26  10 - 35 U/L Final   ALT 04/08/2022 23  9 - 46 U/L Final   Vitamin B-12 04/08/2022 298  200 - 1,100 pg/mL Final   Comment: . Please Note: Although the reference range for vitamin B12 is [phone] pg/mL, it has been reported that between 5 and 10% of patients with values between 200 and 400 pg/mL may experience neuropsychiatric and hematologic abnormalities due to occult B12 deficiency; less than 1% of patients with values above 400 pg/mL will have symptoms. .    Cholesterol 04/08/2022 199  <200 mg/dL Final   HDL 04/08/2022 103  > OR = 40 mg/dL Final   Triglycerides 04/08/2022 59  <150 mg/dL Final   LDL Cholesterol (Calc) 04/08/2022 82  mg/dL  (calc) Final   Comment: Reference range: <100 . Desirable range <100 mg/dL for primary prevention;   <70 mg/dL for patients with CHD or diabetic patients  with > or = 2 CHD risk factors. .Marland KitchenLDL-C is now calculated using the Martin-Hopkins  calculation, which is a validated novel method providing  better accuracy than the Friedewald equation in the  estimation of LDL-C.  MCresenciano Genreet al. JAnnamaria Helling 25697;948(01: 2061-2068  (http://education.[facility].com/faq/FAQ164)    Total CHOL/HDL Ratio 04/08/2022 1.9  <5.0 (calc) Final   Non-HDL Cholesterol (Calc) 04/08/2022 96  <130 mg/dL (calc) Final   Comment: For patients with diabetes plus 1 major ASCVD risk  factor, treating to a non-HDL-C goal of <100 mg/dL  (LDL-C of <70 mg/dL) is considered a therapeutic  option.    TSH 04/08/2022 2.38  0.40 - 4.50 mIU/L Final    Past Medical History:  Diagnosis Date   GERD (gastroesophageal reflux disease)    tums   Hearing loss    History of kidney stones    Hyperlipidemia    Labral tear of shoulder    Lumbar spinal stenosis    Past Surgical History:  Procedure Laterality Date   BACK SURGERY  08/19/2020   COLONOSCOPY     EYE SURGERY  2000   lasic   MOUTH SURGERY     teeth removed, dental  implants placed   TONSILLECTOMY     VASECTOMY  1983   Current Outpatient Medications on File Prior to Visit  Medication Sig Dispense Refill   aspirin EC 81 MG tablet Take 81 mg by mouth daily.     atorvastatin (LIPITOR) 40 MG tablet TAKE 1 TABLET BY MOUTH  DAILY 90 tablet 0   Cholecalciferol (VITAMIN D-3) 125 MCG (5000 UT) TABS Take 5,000 Units by mouth 3 (three) times a week.     Multiple Vitamin (MULTIVITAMIN) capsule Take 1 capsule by mouth daily.     Current Facility-Administered Medications on File Prior to Visit  Medication Dose Route Frequency Provider Last Rate Last Admin   sodium chloride flush (NS) 0.9 % injection 10 mL  10 mL Intravenous PRN Josue Hector, MD   20 mL at 01/08/21 1535    Allergies  Allergen Reactions   Sulfa Antibiotics     Unknown reaction   Social History   Socioeconomic History   Marital status: Married    Spouse name: Not on file   Number of children: Not on file   Years of education: Not on file   Highest education level: Not on file  Occupational History   Not on file  Tobacco Use   Smoking status: Never   Smokeless tobacco: Never  Vaping Use   Vaping Use: Never used  Substance and Sexual Activity   Alcohol use: Yes    Alcohol/week: 9.0 standard drinks of alcohol    Types: 5 Glasses of wine, 4 Cans of beer per week   Drug use: No   Sexual activity: Yes    Birth control/protection: Surgical    Comment: vasectomy  Other Topics Concern   Not on file  Social History Narrative   Not on file   Social Determinants of Health   Financial Resource Strain: Low Risk  (02/15/2021)   Overall Financial Resource Strain (CARDIA)    Difficulty of Paying Living Expenses: Not hard at all  Food Insecurity: No Food Insecurity (02/15/2021)   Hunger Vital Sign    Worried About Running Out of Food in the Last Year: Never true    Ran Out of Food in the Last Year: Never true  Transportation Needs: No Transportation Needs (02/15/2021)   PRAPARE - Hydrologist (Medical): No    Lack of Transportation (Non-Medical): No  Physical Activity: Sufficiently Active (02/15/2021)   Exercise Vital Sign    Days of Exercise per Week: 7 days    Minutes of Exercise per Session: 60 min  Stress: No Stress Concern Present (02/15/2021)   [employer]    Feeling of Stress : Not at all  Social Connections: Moderately Isolated (02/15/2021)   Social Connection and Isolation Panel [NHANES]    Frequency of Communication with Friends and Family: Once a week    Frequency of Social Gatherings with Friends and Family: Once a week    Attends Religious Services: Never    Corporate treasurer or Organizations: Yes    Attends Music therapist: More than 4 times per year    Marital Status: Married  Human resources officer Violence: Not At Risk (02/15/2021)   Humiliation, Afraid, Rape, and Kick questionnaire    Fear of Current or Ex-Partner: No    Emotionally Abused: No    Physically Abused: No    Sexually Abused: No   Family History  Problem Relation Age of Onset  Cancer Mother        skin   Heart disease Mother    Vision loss Mother    Early death Father    Arthritis Maternal Grandmother    Cancer Maternal Grandmother    Hyperlipidemia Maternal Grandmother    Hypertension Maternal Grandmother    Cancer Maternal Grandfather    Heart disease Paternal Grandfather    Stroke Paternal Grandfather       Review of Systems  All other systems reviewed and are negative.      Objective:   Physical Exam Vitals reviewed.  Constitutional:      General: He is not in acute distress.    Appearance: He is well-developed. He is not diaphoretic.  HENT:     Head: Normocephalic and atraumatic.     Right Ear: External ear normal.     Left Ear: External ear normal.     Nose: Nose normal.     Mouth/Throat:     Pharynx: No oropharyngeal exudate.  Eyes:     General: No scleral icterus.       Right eye: No discharge.        Left eye: No discharge.     Conjunctiva/sclera: Conjunctivae normal.     Pupils: Pupils are equal, round, and reactive to light.  Neck:     Thyroid: No thyromegaly.     Vascular: Carotid bruit present. No JVD.     Trachea: No tracheal deviation.  Cardiovascular:     Rate and Rhythm: Normal rate and regular rhythm.     Heart sounds: Murmur heard.     No friction rub. No gallop.  Pulmonary:     Effort: Pulmonary effort is normal. No respiratory distress.     Breath sounds: Normal breath sounds. No wheezing or rales.  Chest:     Chest wall: No tenderness.  Abdominal:     General: Bowel sounds are normal. There is no distension.      Palpations: Abdomen is soft. There is no mass.     Tenderness: There is no abdominal tenderness. There is no guarding or rebound.  Musculoskeletal:     Cervical back: Normal range of motion and neck supple.  Lymphadenopathy:     Cervical: No cervical adenopathy.  Skin:    General: Skin is warm.     Coloration: Skin is not pale.     Findings: No erythema or rash.  Neurological:     Mental Status: He is alert and oriented to person, place, and time.     Cranial Nerves: No cranial nerve deficit.     Motor: No abnormal muscle tone.     Coordination: Coordination normal.     Deep Tendon Reflexes: Reflexes normal.  Psychiatric:        Behavior: Behavior normal.        Thought Content: Thought content normal.        Judgment: Judgment normal.           Assessment & Plan:   Skin exam, screening for cancer - Plan: Ambulatory referral to Dermatology  Colon cancer screening - Plan: Cologuard  Prostate cancer screening - Plan: PSA  Encounter for Medicare annual wellness exam  PFO (patent foramen ovale) Patient's blood work and physical exam are outstanding.  He follows up with cardiology in the fall.  We discussed ordering an MRI for his recent transient memory impairment to evaluate for lacunar infarct but he defers.  I recommended a flu shot and COVID shot when  they become available.  I will order Cologuard.  He would like to see a dermatologist for annual skin exam however his skin is normal in appearance today.  The remainder of his preventative care is up-to-date.  Check a PSA to screen for prostate cancer.

## 2022-05-07 LAB — PSA: PSA: 0.56 ng/mL (ref <=4.00)

## 2022-06-08 DIAGNOSIS — Z1211 Encounter for screening for malignant neoplasm of colon: Secondary | ICD-10-CM | POA: Diagnosis not present

## 2022-06-14 LAB — COLOGUARD: COLOGUARD: NEGATIVE

## 2022-06-19 ENCOUNTER — Other Ambulatory Visit: Payer: Self-pay | Admitting: Family Medicine

## 2022-06-20 NOTE — Telephone Encounter (Signed)
Requested Prescriptions  Pending Prescriptions Disp Refills  . atorvastatin (LIPITOR) 40 MG tablet [Pharmacy Med Name: Atorvastatin Calcium 40 MG Oral Tablet] 90 tablet 3    Sig: TAKE 1 TABLET BY MOUTH DAILY     Cardiovascular:  Antilipid - Statins Failed - 06/19/2022 10:33 PM      Failed - Valid encounter within last 12 months    Recent Outpatient Visits          1 year ago Encounter for Medicare annual wellness exam   [place] [facility], Cammie Mcgee, MD   1 year ago Encounter for Medicare annual wellness exam   [facility] Susy Frizzle, MD   2 years ago Physical exam, annual   [place] Susy Frizzle, MD   2 years ago Pharyngitis, unspecified etiology   [place], [place], FNP   3 years ago Physical exam, annual   [place] [facility] Schaumann, Cammie Mcgee, MD             Failed - Lipid Panel in normal range within the last 12 months    Cholesterol  Date Value Ref Range Status  04/08/2022 199 <200 mg/dL Final   LDL Cholesterol (Calc)  Date Value Ref Range Status  04/08/2022 82 mg/dL (calc) Final    Comment:    Reference range: <100 . Desirable range <100 mg/dL for primary prevention;   <70 mg/dL for patients with CHD or diabetic patients  with > or = 2 CHD risk factors. Marland Kitchen LDL-C is now calculated using the Martin-Hopkins  calculation, which is a validated novel method providing  better accuracy than the Friedewald equation in the  estimation of LDL-C.  Cresenciano Genre et al. Annamaria Helling. 5329;924(26): 2061-2068  (http://education.[facility].com/faq/FAQ164)    HDL  Date Value Ref Range Status  04/08/2022 103 > OR = 40 mg/dL Final   Triglycerides  Date Value Ref Range Status  04/08/2022 59 <150 mg/dL Final         Passed - Patient is not pregnant

## 2022-06-26 ENCOUNTER — Ambulatory Visit (HOSPITAL_COMMUNITY): Payer: BC Managed Care – PPO | Attending: Cardiovascular Disease

## 2022-06-26 DIAGNOSIS — I350 Nonrheumatic aortic (valve) stenosis: Secondary | ICD-10-CM | POA: Diagnosis not present

## 2022-06-26 LAB — ECHOCARDIOGRAM COMPLETE
AR max vel: 1.31 cm2
AV Area VTI: 1.52 cm2
AV Area mean vel: 1.3 cm2
AV Mean grad: 19 mmHg
AV Peak grad: 35.4 mmHg
Ao pk vel: 2.98 m/s
Area-P 1/2: 3.53 cm2
S' Lateral: 3 cm

## 2022-06-27 ENCOUNTER — Telehealth: Payer: Self-pay

## 2022-06-27 DIAGNOSIS — I77819 Aortic ectasia, unspecified site: Secondary | ICD-10-CM

## 2022-06-27 NOTE — Telephone Encounter (Signed)
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

## 2022-06-27 NOTE — Telephone Encounter (Signed)
Patient aware of results. Order placed for chest CT gated.

## 2022-07-15 ENCOUNTER — Ambulatory Visit (HOSPITAL_COMMUNITY)
Admission: RE | Admit: 2022-07-15 | Discharge: 2022-07-15 | Disposition: A | Discharge status: Home / Self Care | Payer: BC Managed Care – PPO | Source: Ambulatory Visit | Attending: Cardiovascular Disease | Admitting: Cardiovascular Disease

## 2022-07-15 DIAGNOSIS — I7121 Aneurysm of the ascending aorta, without rupture: Secondary | ICD-10-CM | POA: Diagnosis not present

## 2022-07-15 DIAGNOSIS — I77819 Aortic ectasia, unspecified site: Secondary | ICD-10-CM | POA: Diagnosis not present

## 2022-07-15 MED ORDER — IOHEXOL 350 MG/ML SOLN
80.0000 mL | Freq: Once | INTRAVENOUS | Status: AC | PRN
  Administered 2022-07-15: 80 mL via INTRAVENOUS

## 2022-07-16 ENCOUNTER — Telehealth: Payer: Self-pay | Admitting: Cardiovascular Disease

## 2022-07-16 NOTE — Telephone Encounter (Signed)
Patient returning call.

## 2022-07-16 NOTE — Progress Notes (Unsigned)
-    Cardiology Office Note   Date:  07/17/2022   ID:  Patrick Wall, DOB 11/04/1950, MRN [id number]  PCP:  Donita Brooks, MD  Cardiologist:   Charlton Haws, MD   No chief complaint on file.     History of Present Illness: Patrick Wall is a 71 y.o. male first seen March 2021  regarding CAD.Marland Kitchen Paternal aunt died at 40 of MI. Father died suddenly age 56's after snorkeling Patient Himself is active competing in triathlons.  No cardiac symptoms. Currently Taking statin.    He does triathlons when he runs first mile which is hard then feels better Had cath in 2007 for abnormal holter ? 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   Married works in lab. 3 children 2 in [place] and one in [place]  F/U studies reviewed  Calcium score 09/02/18 424 Myovue 09/17/18 normal no ischemia EF 59% TTE 06/26/22 EF 60-65% mild to moderate AS mean gradient 19 mmhg peak 35.3 mmHg AVA 1.52 cm2 DVI 0.42 Gradients same as TTE done 07/15/21  Aortic root measured 4.3 cm    Had L45 fusion with Dr Otelia Sergeant on 08/20/20   Active but limited by back Did his first [facility]helon at [facility]  March 2021 He is slowing down Son is in [place].   Discussed doing cardiac CTA to r/o CAD Progression and assess amount of calcium on AV but he is concerned about the  Multiple bills he would get from [facility]  Had some "mental fog" no objective evidence of TIA/CVA seen by primary Dr Tanya Nones and patient deferred MRI    Past Medical History:  Diagnosis Date   GERD (gastroesophageal reflux disease)    tums   Hearing loss    History of kidney stones    Hyperlipidemia    Labral tear of shoulder    Lumbar spinal stenosis     Past Surgical History:  Procedure Laterality Date   BACK SURGERY  08/19/2020   COLONOSCOPY     EYE SURGERY  2000   lasic   MOUTH SURGERY     teeth removed, dental implants placed   TONSILLECTOMY     VASECTOMY  1983     Current  Outpatient Medications  Medication Sig Dispense Refill   aspirin EC 81 MG tablet Take 81 mg by mouth daily.     atorvastatin (LIPITOR) 40 MG tablet TAKE 1 TABLET BY MOUTH DAILY 90 tablet 3   Cholecalciferol (VITAMIN D-3) 125 MCG (5000 UT) TABS Take 5,000 Units by mouth 3 (three) times a week.     Multiple Vitamin (MULTIVITAMIN) capsule Take 1 capsule by mouth daily.     No current facility-administered medications for this visit.   Facility-Administered Medications Ordered in Other Visits  Medication Dose Route Frequency Provider Last Rate Last Admin   sodium chloride flush (NS) 0.9 % injection 10 mL  10 mL Intravenous PRN Wendall Stade, MD   20 mL at 01/08/21 1535    Allergies:   Sulfa antibiotics    Social History:  The patient  reports that he has never smoked. He has never used smokeless tobacco. He reports current alcohol use of about 9.0 standard drinks of alcohol per week. He reports that he does not use drugs.   Family History:  The patient's family history includes Arthritis in his maternal grandmother; Cancer in his maternal grandfather, maternal grandmother, and mother; Early death in his  father; Heart disease in his mother and paternal grandfather; Hyperlipidemia in his maternal grandmother; Hypertension in his maternal grandmother; Stroke in his paternal grandfather; Vision loss in his mother.    ROS:  Please see the history of present illness.   Otherwise, review of systems are positive for none.   All other systems are reviewed and negative.    PHYSICAL EXAM: VS:  There were no vitals taken for this visit. , BMI There is no height or weight on file to calculate BMI. Affect appropriate Healthy:  appears stated age HEENT: normal Neck supple with no adenopathy JVP normal bilateral bruits no thyromegaly Lungs clear with no wheezing and good diaphragmatic motion Heart:  S1/S2 AS murmur radiates to carotids , no rub, gallop or click PMI normal Abdomen: benighn, BS  positve, no tenderness, no AAA no bruit.  No HSM or HJR Distal pulses intact with no bruits No edema Neuro non-focal Skin warm and dry No muscular weakness    EKG:  2014 SB rate 45 normal 08/27/18 SR rate 51 normal voltage for LVH SR rate 45 normal  07/17/2022 SR Rate 49 normal voltage for LVH   Recent Labs: 04/08/2022: ALT 23; BUN 23; Creat 1.06; Hemoglobin 15.0; Platelets 160; Potassium 4.8; Sodium 138; TSH 2.38    Lipid Panel    Component Value Date/Time   CHOL 199 04/08/2022 1129   TRIG 59 04/08/2022 1129   HDL 103 04/08/2022 1129   CHOLHDL 1.9 04/08/2022 1129   VLDL 10 10/15/2016 0832   LDLCALC 82 04/08/2022 1129      Wt Readings from Last 3 Encounters:  05/06/22 203 lb 3.2 oz (92.2 kg)  04/08/22 203 lb 3.2 oz (92.2 kg)  11/21/21 204 lb (92.5 kg)      Other studies Reviewed: Additional studies/ records that were reviewed today include: notes from primary labs CXR and ECG 2014. Myovue 09/17/18 , echo 09/02/18 calcium score 09/02/18  TTE 07/15/21 Carotid 01/08/21    ASSESSMENT AND PLAN:  1.  CAD Risk:  Calcium score 424 09/13/18 this is 78 th percentile Myovue normal 09/17/18 continue risk factor modification considered cardiac CTA with TAVR protocol to r/o progression of CAD And assess amount of calcium on AV but patient defers due to cost  2. HLD:  Statin  LDL 81 improved  3. Bradycardia:  Functional due to training effect normal ECG 4. AS:  moderate by echo 07/15/21  5. PFO : not clinically significant observe  6. Back pain:  08/20/20 post fusion L45 f/u Dr Otelia Sergeant  7. Bruits:  He has bilateral bruits likely referred AV murmur Duplex 01/08/21 no dx      Current medicines are reviewed at length with the patient today.  The patient does not have concerns regarding medicines.  The following changes have been made:  no change  Labs/ tests ordered today include:  none  No orders of the defined types were placed in this encounter.    Disposition:   FU with cardiology  in  a year with TTE and discuss f/u MRA of aorta    Signed, Charlton Haws, MD  07/17/2022 11:17 AM    [facility] [address] [place], [place], [place]  [address] Phone: [phone]; Fax: [phone]

## 2022-07-16 NOTE — Telephone Encounter (Addendum)
Per Dr. Eden Emms, Aorta only 4.0 cm mildly dilated consider f/u MRI in a year. Patient aware of results. Patient is due to see Dr. Eden Emms, so made him an appointment tomorrow.

## 2022-07-17 ENCOUNTER — Ambulatory Visit: Payer: BC Managed Care – PPO | Attending: Cardiovascular Disease | Admitting: Cardiovascular Disease

## 2022-07-17 ENCOUNTER — Encounter: Payer: Self-pay | Admitting: Cardiovascular Disease

## 2022-07-17 VITALS — BP 124/82 | HR 46 | Ht 75.0 in | Wt 204.0 lb

## 2022-07-17 DIAGNOSIS — I350 Nonrheumatic aortic (valve) stenosis: Secondary | ICD-10-CM

## 2022-07-17 NOTE — Patient Instructions (Addendum)
Medication Instructions:  Your physician recommends that you continue on your current medications as directed. Please refer to the Current Medication list given to you today.  *If you need a refill on your cardiac medications before your next appointment, please call your pharmacy*  Lab Work: If you have labs (blood work) drawn today and your tests are completely normal, you will receive your results only by: MyChart Message (if you have MyChart) OR A paper copy in the mail If you have any lab test that is abnormal or we need to change your treatment, we will call you to review the results.  Testing/Procedures: Your physician has requested that you have an echocardiogram in one year. Echocardiography is a painless test that uses sound waves to create images of your heart. It provides your doctor with information about the size and shape of your heart and how well your heart's chambers and valves are working. This procedure takes approximately one hour. There are no restrictions for this procedure. Please do NOT wear cologne, perfume, aftershave, or lotions (deodorant is allowed). Please arrive 15 minutes prior to your appointment time.   Follow-Up: At [facility], you and your health needs are our priority.  As part of our continuing mission to provide you with exceptional heart care, we have created designated Provider Care Teams.  These Care Teams include your primary Cardiologist (physician) and Advanced Practice Providers (APPs -  Physician Assistants and Nurse Practitioners) who all work together to provide you with the care you need, when you need it.  We recommend signing up for the patient portal called "MyChart".  Sign up information is provided on this After Visit Summary.  MyChart is used to connect with patients for Virtual Visits (Telemedicine).  Patients are able to view lab/test results, encounter notes, upcoming appointments, etc.  Non-urgent messages can be sent to your  provider as well.   To learn more about what you can do with MyChart, go to [email or web address].    Your next appointment:   12 month(s)  The format for your next appointment:   In Person  Provider:   Charlton Haws, MD       Important Information About Sugar

## 2022-07-21 MED ORDER — FENTANYL CITRATE (PF) 250 MCG/5ML IJ SOLN
INTRAMUSCULAR | Status: AC
  Filled 2022-07-21: qty 5

## 2022-07-21 MED ORDER — PROPOFOL 10 MG/ML IV BOLUS
INTRAVENOUS | Status: AC
  Filled 2022-07-21: qty 20

## 2022-07-21 MED ORDER — MIDAZOLAM HCL 2 MG/2ML IJ SOLN
INTRAMUSCULAR | Status: AC
  Filled 2022-07-21: qty 2

## 2022-09-10 DIAGNOSIS — L821 Other seborrheic keratosis: Secondary | ICD-10-CM | POA: Diagnosis not present

## 2022-09-10 DIAGNOSIS — L814 Other melanin hyperpigmentation: Secondary | ICD-10-CM | POA: Diagnosis not present

## 2022-09-10 DIAGNOSIS — D1801 Hemangioma of skin and subcutaneous tissue: Secondary | ICD-10-CM | POA: Diagnosis not present

## 2022-09-12 ENCOUNTER — Encounter: Payer: Self-pay | Admitting: Family Medicine

## 2022-11-26 ENCOUNTER — Telehealth: Payer: Self-pay

## 2022-11-26 ENCOUNTER — Encounter: Payer: Self-pay | Admitting: Family Medicine

## 2022-11-26 NOTE — Telephone Encounter (Signed)
MY CHART MESSAGE FROM PATIENT:  Beth and I are heading to [place], [place] in a few weeks. When I had my physical with Dr Dennard Schaumann a few months ago I mentioned my concern for getting altitude sickness . He said to contact him when the time gets closer and he will prescribe some medication to prevent this. Is this something he could do for [place] now?

## 2022-11-27 ENCOUNTER — Other Ambulatory Visit: Payer: Self-pay | Admitting: Family Medicine

## 2022-11-27 MED ORDER — ACETAZOLAMIDE 125 MG PO TABS: 125.0000 mg | ORAL_TABLET | Freq: Two times a day (BID) | ORAL | 0 refills | Status: DC

## 2023-04-10 ENCOUNTER — Telehealth: Payer: Self-pay | Admitting: Cardiovascular Disease

## 2023-04-10 DIAGNOSIS — I350 Nonrheumatic aortic (valve) stenosis: Secondary | ICD-10-CM

## 2023-04-10 NOTE — Telephone Encounter (Signed)
Per Dr. Eden Emms, do his echo first, depending on how bad his AS is will decide on follow-up office visit in November if he needs CT or MRA.   Called patient back to let him know Dr. Eden Emms advisement. Patient verbalized understanding.

## 2023-04-10 NOTE — Telephone Encounter (Signed)
Patient needs echo same day as appt with Dr. Eden Emms on 12/12 but order expires before then. Can we extend the expiration date?

## 2023-04-10 NOTE — Telephone Encounter (Signed)
Will send message to Dr. Eden Emms for advisement. Per last CT, Dr. Eden Emms result note stated "consider f/u MRI in a year". Office note on 07/17/22, stated discuss f/u MRA of aorta in a year.  Patient is scheduled for echo before his office visit with Alden Server in November.  See below.     Wendall Stade, MD 07/15/2022 12:38 PM EST     Aorta only 4.0 cm mildly dilated consider f/u MRI in a year   Dr. Fabio Bering Office note on 07/17/22 FU with cardiology in  a year with TTE and discuss f/u MRA of aorta

## 2023-04-10 NOTE — Telephone Encounter (Signed)
Patient had a Chest CT last November, he states that he is supposed to have another one this year but I do not see the order. If he needs another CT can someone place that so he can get scheduled?

## 2023-04-10 NOTE — Telephone Encounter (Signed)
Ordered update order. Will send message to scheduler to help with appointment.

## 2023-05-06 ENCOUNTER — Other Ambulatory Visit: Payer: BC Managed Care – PPO

## 2023-05-06 DIAGNOSIS — I10 Essential (primary) hypertension: Secondary | ICD-10-CM | POA: Diagnosis not present

## 2023-05-06 DIAGNOSIS — E785 Hyperlipidemia, unspecified: Secondary | ICD-10-CM | POA: Diagnosis not present

## 2023-05-06 DIAGNOSIS — Z125 Encounter for screening for malignant neoplasm of prostate: Secondary | ICD-10-CM

## 2023-05-06 DIAGNOSIS — R413 Other amnesia: Secondary | ICD-10-CM | POA: Diagnosis not present

## 2023-05-06 DIAGNOSIS — Z1211 Encounter for screening for malignant neoplasm of colon: Secondary | ICD-10-CM

## 2023-05-06 DIAGNOSIS — R5383 Other fatigue: Secondary | ICD-10-CM

## 2023-05-07 LAB — COMPLETE METABOLIC PANEL WITH GFR
AG Ratio: 2.2 (calc) (ref 1.0–2.5)
ALT: 20 U/L (ref 9–46)
AST: 27 U/L (ref 10–35)
Albumin: 4.6 g/dL (ref 3.6–5.1)
Alkaline phosphatase (APISO): 39 U/L (ref 35–144)
BUN: 21 mg/dL (ref 7–25)
CO2: 27 mmol/L (ref 20–32)
Calcium: 9.7 mg/dL (ref 8.6–10.3)
Chloride: 103 mmol/L (ref 98–110)
Creat: 1.04 mg/dL (ref 0.70–1.28)
Globulin: 2.1 g/dL (ref 1.9–3.7)
Glucose, Bld: 99 mg/dL (ref 65–99)
Potassium: 4.4 mmol/L (ref 3.5–5.3)
Sodium: 139 mmol/L (ref 135–146)
Total Bilirubin: 1.2 mg/dL (ref 0.2–1.2)
Total Protein: 6.7 g/dL (ref 6.1–8.1)
eGFR: 77 mL/min/{1.73_m2} (ref >=60)

## 2023-05-07 LAB — LIPID PANEL
Cholesterol: 193 mg/dL (ref <200)
HDL: 101 mg/dL (ref >=40)
LDL Cholesterol (Calc): 79 mg/dL
Non-HDL Cholesterol (Calc): 92 mg/dL (ref <130)
Total CHOL/HDL Ratio: 1.9 (calc) (ref <5.0)
Triglycerides: 51 mg/dL (ref <150)

## 2023-05-07 LAB — CBC WITH DIFFERENTIAL/PLATELET
Absolute Monocytes: 355 {cells}/uL (ref 200–950)
Basophils Absolute: 19 {cells}/uL (ref 0–200)
Basophils Relative: 0.6 %
Eosinophils Absolute: 90 {cells}/uL (ref 15–500)
Eosinophils Relative: 2.8 %
HCT: 44.5 % (ref 38.5–50.0)
Hemoglobin: 14.9 g/dL (ref 13.2–17.1)
Lymphs Abs: 1376 {cells}/uL (ref 850–3900)
MCH: 31.4 pg (ref 27.0–33.0)
MCHC: 33.5 g/dL (ref 32.0–36.0)
MCV: 93.7 fL (ref 80.0–100.0)
MPV: 11.1 fL (ref 7.5–12.5)
Monocytes Relative: 11.1 %
Neutro Abs: 1360 {cells}/uL — ABNORMAL LOW (ref 1500–7800)
Neutrophils Relative %: 42.5 %
Platelets: 159 10*3/uL (ref 140–400)
RBC: 4.75 10*6/uL (ref 4.20–5.80)
RDW: 13 % (ref 11.0–15.0)
Total Lymphocyte: 43 %
WBC: 3.2 10*3/uL — ABNORMAL LOW (ref 3.8–10.8)

## 2023-05-07 LAB — TSH: TSH: 2 m[IU]/L (ref 0.40–4.50)

## 2023-05-07 LAB — VITAMIN B12: Vitamin B-12: 440 pg/mL (ref 200–1100)

## 2023-05-07 LAB — PSA: PSA: 0.67 ng/mL (ref <=4.00)

## 2023-05-08 ENCOUNTER — Ambulatory Visit: Payer: BC Managed Care – PPO | Admitting: Family Medicine

## 2023-05-08 ENCOUNTER — Encounter: Payer: Self-pay | Admitting: Family Medicine

## 2023-05-08 VITALS — BP 150/82 | HR 57 | Ht 75.0 in | Wt 205.8 lb

## 2023-05-08 DIAGNOSIS — Z23 Encounter for immunization: Secondary | ICD-10-CM | POA: Diagnosis not present

## 2023-05-08 DIAGNOSIS — I350 Nonrheumatic aortic (valve) stenosis: Secondary | ICD-10-CM

## 2023-05-08 DIAGNOSIS — Z0000 Encounter for general adult medical examination without abnormal findings: Secondary | ICD-10-CM

## 2023-05-08 DIAGNOSIS — R931 Abnormal findings on diagnostic imaging of heart and coronary circulation: Secondary | ICD-10-CM

## 2023-05-08 DIAGNOSIS — Z0001 Encounter for general adult medical examination with abnormal findings: Secondary | ICD-10-CM | POA: Diagnosis not present

## 2023-05-08 NOTE — Progress Notes (Signed)
Subjective:    Patient ID: Patrick Wall, male    DOB: 1951-04-05, 72 y.o.   MRN: [id number]  HPI Patient is a very pleasant 72 year old Caucasian gentleman here today for complete physical exam.  He is due for a flu shot, COVID booster when they come out.  He is also due for Shingrix.  Patient has a history of an elevated coronary artery calcium score.  It was over 400.  For that reason he is on a statin.  He also has a history of mild to moderate aortic stenosis.  On examination today, his aortic murmur is now 3 of 6.  Has an appoint November repeat an echocardiogram.  He denies pain or shortness of breath or dyspnea on exertion.  He had Cologuard last year which was normal.  His PSA is excellent. Lab on 05/06/2023  Component Date Value Ref Range Status   WBC 05/06/2023 3.2 (L)  3.8 - 10.8 Thousand/uL Final   RBC 05/06/2023 4.75  4.20 - 5.80 Million/uL Final   Hemoglobin 05/06/2023 14.9  13.2 - 17.1 g/dL Final   HCT 40/98/1191 44.5  38.5 - 50.0 % Final   MCV 05/06/2023 93.7  80.0 - 100.0 fL Final   MCH 05/06/2023 31.4  27.0 - 33.0 pg Final   MCHC 05/06/2023 33.5  32.0 - 36.0 g/dL Final   RDW 47/82/9562 13.0  11.0 - 15.0 % Final   Platelets 05/06/2023 159  140 - 400 Thousand/uL Final   MPV 05/06/2023 11.1  7.5 - 12.5 fL Final   Neutro Abs 05/06/2023 1,360 (L)  1,500 - 7,800 cells/uL Final   Lymphs Abs 05/06/2023 1,376  850 - 3,900 cells/uL Final   Absolute Monocytes 05/06/2023 355  200 - 950 cells/uL Final   Eosinophils Absolute 05/06/2023 90  15 - 500 cells/uL Final   Basophils Absolute 05/06/2023 19  0 - 200 cells/uL Final   Neutrophils Relative % 05/06/2023 42.5  % Final   Total Lymphocyte 05/06/2023 43.0  % Final   Monocytes Relative 05/06/2023 11.1  % Final   Eosinophils Relative 05/06/2023 2.8  % Final   Basophils Relative 05/06/2023 0.6  % Final   Glucose, Bld 05/06/2023 99  65 - 99 mg/dL Final   Comment: .            Fasting reference interval .    BUN 05/06/2023 21  7 - 25  mg/dL Final   Creat 13/04/6577 1.04  0.70 - 1.28 mg/dL Final   eGFR 46/96/2952 77  > OR = 60 mL/min/1.73m2 Final   BUN/Creatinine Ratio 05/06/2023 SEE NOTE:  6 - 22 (calc) Final   Comment:    Not Reported: BUN and Creatinine are within    reference range. .    Sodium 05/06/2023 139  135 - 146 mmol/L Final   Potassium 05/06/2023 4.4  3.5 - 5.3 mmol/L Final   Chloride 05/06/2023 103  98 - 110 mmol/L Final   CO2 05/06/2023 27  20 - 32 mmol/L Final   Calcium 05/06/2023 9.7  8.6 - 10.3 mg/dL Final   Total Protein 84/13/2440 6.7  6.1 - 8.1 g/dL Final   Albumin 06/28/2535 4.6  3.6 - 5.1 g/dL Final   Globulin 64/40/3474 2.1  1.9 - 3.7 g/dL (calc) Final   AG Ratio 05/06/2023 2.2  1.0 - 2.5 (calc) Final   Total Bilirubin 05/06/2023 1.2  0.2 - 1.2 mg/dL Final   Alkaline phosphatase (APISO) 05/06/2023 39  35 - 144 U/L Final   AST  05/06/2023 27  10 - 35 U/L Final   ALT 05/06/2023 20  9 - 46 U/L Final   Cholesterol 05/06/2023 193  <200 mg/dL Final   HDL 16/06/9603 101  > OR = 40 mg/dL Final   Triglycerides 54/05/8118 51  <150 mg/dL Final   LDL Cholesterol (Calc) 05/06/2023 79  mg/dL (calc) Final   Comment: Reference range: <100 . Desirable range <100 mg/dL for primary prevention;   <70 mg/dL for patients with CHD or diabetic patients  with > or = 2 CHD risk factors. Marland Kitchen LDL-C is now calculated using the Martin-Hopkins  calculation, which is a validated novel method providing  better accuracy than the Friedewald equation in the  estimation of LDL-C.  Horald Pollen et al. Lenox Ahr. 1478;295(62): 2061-2068  (http://education.[facility].com/faq/FAQ164)    Total CHOL/HDL Ratio 05/06/2023 1.9  <1.3 (calc) Final   Non-HDL Cholesterol (Calc) 05/06/2023 92  <130 mg/dL (calc) Final   Comment: For patients with diabetes plus 1 major ASCVD risk  factor, treating to a non-HDL-C goal of <100 mg/dL  (LDL-C of <08 mg/dL) is considered a therapeutic  option.    TSH 05/06/2023 2.00  0.40 - 4.50 mIU/L Final    PSA 05/06/2023 0.67  < OR = 4.00 ng/mL Final   Comment: The total PSA value from this assay system is  standardized against the WHO standard. The test  result will be approximately 20% lower when compared  to the equimolar-standardized total PSA (Beckman  Coulter). Comparison of serial PSA results should be  interpreted with this fact in mind. . This test was performed using the Siemens  chemiluminescent method. Values obtained from  different assay methods cannot be used interchangeably. PSA levels, regardless of value, should not be interpreted as absolute evidence of the presence or absence of disease.    Vitamin B-12 05/06/2023 440  200 - 1,100 pg/mL Final    Immunization History  Administered Date(s) Administered   COVID-19, mRNA, vaccine(Comirnaty)12 years and older 05/06/2022   Fluad Quad(high Dose 65+) 07/03/2020   Influenza Split 05/21/2015   Influenza,inj,Quad PF,6+ Mos 05/02/2021   Influenza-Unspecified 06/02/2016, 05/24/2018, 05/27/2019, 05/06/2022   PFIZER Comirnaty(Gray Top)Covid-19 Tri-Sucrose Vaccine 01/15/2021   PFIZER(Purple Top)SARS-COV-2 Vaccination 09/03/2019, 09/23/2019, 05/27/2020   Pneumococcal Conjugate-13 07/09/2017   Pneumococcal Polysaccharide-23 09/19/2014, 04/30/2020   Tdap 04/01/2014   Zoster, Live 03/07/2014    Lab on 05/06/2023  Component Date Value Ref Range Status   WBC 05/06/2023 3.2 (L)  3.8 - 10.8 Thousand/uL Final   RBC 05/06/2023 4.75  4.20 - 5.80 Million/uL Final   Hemoglobin 05/06/2023 14.9  13.2 - 17.1 g/dL Final   HCT 65/78/4696 44.5  38.5 - 50.0 % Final   MCV 05/06/2023 93.7  80.0 - 100.0 fL Final   MCH 05/06/2023 31.4  27.0 - 33.0 pg Final   MCHC 05/06/2023 33.5  32.0 - 36.0 g/dL Final   RDW 29/52/8413 13.0  11.0 - 15.0 % Final   Platelets 05/06/2023 159  140 - 400 Thousand/uL Final   MPV 05/06/2023 11.1  7.5 - 12.5 fL Final   Neutro Abs 05/06/2023 1,360 (L)  1,500 - 7,800 cells/uL Final   Lymphs Abs 05/06/2023 1,376  850  - 3,900 cells/uL Final   Absolute Monocytes 05/06/2023 355  200 - 950 cells/uL Final   Eosinophils Absolute 05/06/2023 90  15 - 500 cells/uL Final   Basophils Absolute 05/06/2023 19  0 - 200 cells/uL Final   Neutrophils Relative % 05/06/2023 42.5  % Final   Total  Lymphocyte 05/06/2023 43.0  % Final   Monocytes Relative 05/06/2023 11.1  % Final   Eosinophils Relative 05/06/2023 2.8  % Final   Basophils Relative 05/06/2023 0.6  % Final   Glucose, Bld 05/06/2023 99  65 - 99 mg/dL Final   Comment: .            Fasting reference interval .    BUN 05/06/2023 21  7 - 25 mg/dL Final   Creat 40/98/1191 1.04  0.70 - 1.28 mg/dL Final   eGFR 47/82/9562 77  > OR = 60 mL/min/1.43m2 Final   BUN/Creatinine Ratio 05/06/2023 SEE NOTE:  6 - 22 (calc) Final   Comment:    Not Reported: BUN and Creatinine are within    reference range. .    Sodium 05/06/2023 139  135 - 146 mmol/L Final   Potassium 05/06/2023 4.4  3.5 - 5.3 mmol/L Final   Chloride 05/06/2023 103  98 - 110 mmol/L Final   CO2 05/06/2023 27  20 - 32 mmol/L Final   Calcium 05/06/2023 9.7  8.6 - 10.3 mg/dL Final   Total Protein 13/04/6577 6.7  6.1 - 8.1 g/dL Final   Albumin 46/96/2952 4.6  3.6 - 5.1 g/dL Final   Globulin 84/13/2440 2.1  1.9 - 3.7 g/dL (calc) Final   AG Ratio 05/06/2023 2.2  1.0 - 2.5 (calc) Final   Total Bilirubin 05/06/2023 1.2  0.2 - 1.2 mg/dL Final   Alkaline phosphatase (APISO) 05/06/2023 39  35 - 144 U/L Final   AST 05/06/2023 27  10 - 35 U/L Final   ALT 05/06/2023 20  9 - 46 U/L Final   Cholesterol 05/06/2023 193  <200 mg/dL Final   HDL 06/28/2535 101  > OR = 40 mg/dL Final   Triglycerides 64/40/3474 51  <150 mg/dL Final   LDL Cholesterol (Calc) 05/06/2023 79  mg/dL (calc) Final   Comment: Reference range: <100 . Desirable range <100 mg/dL for primary prevention;   <70 mg/dL for patients with CHD or diabetic patients  with > or = 2 CHD risk factors. Marland Kitchen LDL-C is now calculated using the Martin-Hopkins   calculation, which is a validated novel method providing  better accuracy than the Friedewald equation in the  estimation of LDL-C.  Horald Pollen et al. Lenox Ahr. 2595;638(75): 2061-2068  (http://education.[facility].com/faq/FAQ164)    Total CHOL/HDL Ratio 05/06/2023 1.9  <6.4 (calc) Final   Non-HDL Cholesterol (Calc) 05/06/2023 92  <130 mg/dL (calc) Final   Comment: For patients with diabetes plus 1 major ASCVD risk  factor, treating to a non-HDL-C goal of <100 mg/dL  (LDL-C of <33 mg/dL) is considered a therapeutic  option.    TSH 05/06/2023 2.00  0.40 - 4.50 mIU/L Final   PSA 05/06/2023 0.67  < OR = 4.00 ng/mL Final   Comment: The total PSA value from this assay system is  standardized against the WHO standard. The test  result will be approximately 20% lower when compared  to the equimolar-standardized total PSA (Beckman  Coulter). Comparison of serial PSA results should be  interpreted with this fact in mind. . This test was performed using the Siemens  chemiluminescent method. Values obtained from  different assay methods cannot be used interchangeably. PSA levels, regardless of value, should not be interpreted as absolute evidence of the presence or absence of disease.    Vitamin B-12 05/06/2023 440  200 - 1,100 pg/mL Final    Past Medical History:  Diagnosis Date   GERD (gastroesophageal reflux disease)  tums   Hearing loss    History of kidney stones    Hyperlipidemia    Labral tear of shoulder    Lumbar spinal stenosis    Past Surgical History:  Procedure Laterality Date   BACK SURGERY  08/19/2020   COLONOSCOPY     EYE SURGERY  2000   lasic   MOUTH SURGERY     teeth removed, dental implants placed   TONSILLECTOMY     VASECTOMY  1983   Current Outpatient Medications on File Prior to Visit  Medication Sig Dispense Refill   acetaZOLAMIDE (DIAMOX) 125 MG tablet Take 1 tablet (125 mg total) by mouth 2 (two) times daily. 60 tablet 0   aspirin EC 81 MG  tablet Take 81 mg by mouth daily.     atorvastatin (LIPITOR) 40 MG tablet TAKE 1 TABLET BY MOUTH DAILY 90 tablet 3   Cholecalciferol (VITAMIN D-3) 125 MCG (5000 UT) TABS Take 5,000 Units by mouth 3 (three) times a week.     Cyanocobalamin (B-12) 1000 MCG CAPS Take 1 tablet by mouth daily.     Multiple Vitamin (MULTIVITAMIN) capsule Take 1 capsule by mouth daily.     Current Facility-Administered Medications on File Prior to Visit  Medication Dose Route Frequency Provider Last Rate Last Admin   sodium chloride flush (NS) 0.9 % injection 10 mL  10 mL Intravenous PRN Wendall Stade, MD   20 mL at 01/08/21 1535   Allergies  Allergen Reactions   Sulfa Antibiotics     Unknown reaction   Social History   Socioeconomic History   Marital status: Married    Spouse name: Not on file   Number of children: Not on file   Years of education: Not on file   Highest education level: Bachelor's degree (e.g., BA, AB, BS)  Occupational History   Not on file  Tobacco Use   Smoking status: Never   Smokeless tobacco: Never  Vaping Use   Vaping status: Never Used  Substance and Sexual Activity   Alcohol use: Yes    Alcohol/week: 9.0 standard drinks of alcohol    Types: 5 Glasses of wine, 4 Cans of beer per week   Drug use: No   Sexual activity: Yes    Birth control/protection: Surgical    Comment: vasectomy  Other Topics Concern   Not on file  Social History Narrative   Not on file   Social Determinants of Health   Financial Resource Strain: Low Risk  (05/06/2023)   Overall Financial Resource Strain (CARDIA)    Difficulty of Paying Living Expenses: Not hard at all  Food Insecurity: No Food Insecurity (05/06/2023)   Hunger Vital Sign    Worried About Running Out of Food in the Last Year: Never true    Ran Out of Food in the Last Year: Never true  Transportation Needs: No Transportation Needs (05/06/2023)   PRAPARE - Administrator, Civil Service (Medical): No    Lack of  Transportation (Non-Medical): No  Physical Activity: Sufficiently Active (05/06/2023)   Exercise Vital Sign    Days of Exercise per Week: 6 days    Minutes of Exercise per Session: 60 min  Stress: No Stress Concern Present (05/06/2023)   [employer] of Occupational Health - Occupational Stress Questionnaire    Feeling of Stress : Not at all  Social Connections: Moderately Integrated (05/06/2023)   Social Connection and Isolation Panel [NHANES]    Frequency of Communication with Friends and  Family: More than three times a week    Frequency of Social Gatherings with Friends and Family: More than three times a week    Attends Religious Services: Never    Database administrator or Organizations: Yes    Attends Engineer, structural: More than 4 times per year    Marital Status: Married  Catering manager Violence: Not At Risk (02/15/2021)   Humiliation, Afraid, Rape, and Kick questionnaire    Fear of Current or Ex-Partner: No    Emotionally Abused: No    Physically Abused: No    Sexually Abused: No   Family History  Problem Relation Age of Onset   Cancer Mother        skin   Heart disease Mother    Vision loss Mother    Early death Father    Arthritis Maternal Grandmother    Cancer Maternal Grandmother    Hyperlipidemia Maternal Grandmother    Hypertension Maternal Grandmother    Cancer Maternal Grandfather    Heart disease Paternal Grandfather    Stroke Paternal Grandfather       Review of Systems  All other systems reviewed and are negative.      Objective:   Physical Exam Vitals reviewed.  Constitutional:      General: He is not in acute distress.    Appearance: He is well-developed. He is not diaphoretic.  HENT:     Head: Normocephalic and atraumatic.     Right Ear: External ear normal.     Left Ear: External ear normal.     Nose: Nose normal.     Mouth/Throat:     Pharynx: No oropharyngeal exudate.  Eyes:     General: No scleral icterus.       Right  eye: No discharge.        Left eye: No discharge.     Conjunctiva/sclera: Conjunctivae normal.     Pupils: Pupils are equal, round, and reactive to light.  Neck:     Thyroid: No thyromegaly.     Vascular: Carotid bruit present. No JVD.     Trachea: No tracheal deviation.  Cardiovascular:     Rate and Rhythm: Normal rate and regular rhythm.     Heart sounds: Murmur heard.     No friction rub. No gallop.  Pulmonary:     Effort: Pulmonary effort is normal. No respiratory distress.     Breath sounds: Normal breath sounds. No wheezing or rales.  Chest:     Chest wall: No tenderness.  Abdominal:     General: Bowel sounds are normal. There is no distension.     Palpations: Abdomen is soft. There is no mass.     Tenderness: There is no abdominal tenderness. There is no guarding or rebound.  Musculoskeletal:     Cervical back: Normal range of motion and neck supple.  Lymphadenopathy:     Cervical: No cervical adenopathy.  Skin:    General: Skin is warm.     Coloration: Skin is not pale.     Findings: No erythema or rash.  Neurological:     Mental Status: He is alert and oriented to person, place, and time.     Cranial Nerves: No cranial nerve deficit.     Motor: No abnormal muscle tone.     Coordination: Coordination normal.     Deep Tendon Reflexes: Reflexes normal.  Psychiatric:        Behavior: Behavior normal.  Thought Content: Thought content normal.        Judgment: Judgment normal.           Assessment & Plan:   Encounter for Medicare annual wellness exam  Aortic valve stenosis, etiology of cardiac valve disease unspecified  Elevated coronary artery calcium score Patient's cholesterol is outstanding.  Continue statin due to history of elevated coronary artery calcium score.  Patient has aortic stenosis.  Follow-up with him in November for echocardiogram but at the time he is symptomatic.  He received the shingles vaccine and his flu shot today.  Encouraged a  tetanus booster.  The remainder of his lab work is outstanding.  Cologuard screening is up-to-date.  PSA is outstanding.

## 2023-05-08 NOTE — Addendum Note (Signed)
Addended by: Venia Carbon K on: 05/08/2023 10:05 AM   Modules accepted: Orders

## 2023-06-04 ENCOUNTER — Ambulatory Visit: Payer: BC Managed Care – PPO

## 2023-06-04 VITALS — Ht 75.0 in | Wt 205.0 lb

## 2023-06-04 DIAGNOSIS — Z0000 Encounter for general adult medical examination without abnormal findings: Secondary | ICD-10-CM

## 2023-06-04 NOTE — Patient Instructions (Signed)
[name] , Thank you for taking time to come for your Medicare Wellness Visit. I appreciate your ongoing commitment to your health goals. Please review the following plan we discussed and let me know if I can assist you in the future.   Referrals/Orders/Follow-Ups/Clinician Recommendations: Aim for 30 minutes of exercise or brisk walking, 6-8 glasses of water, and 5 servings of fruits and vegetables each day.  This is a list of the screening recommended for you and due dates:  Health Maintenance  Topic Date Due   COVID-19 Vaccine (6 - 2023-24 season) 05/03/2023   Zoster (Shingles) Vaccine (2 of 2) 07/03/2023   DTaP/Tdap/Td vaccine (2 - Td or Tdap) 04/01/2024   Cologuard (Stool DNA test)  06/08/2025   Pneumonia Vaccine  Completed   Flu Shot  Completed   Hepatitis C Screening  Completed   HPV Vaccine  Aged Out   Colon Cancer Screening  Discontinued    Advanced directives: Information on Advanced Care Planning can be found at [facility] Advance Health Care Directives Advance Health Care Directives ([email or web address]   Next Medicare Annual Wellness Visit scheduled for next year: Yes

## 2023-06-04 NOTE — Progress Notes (Signed)
Subjective:   Patrick Wall is a 72 y.o. male who presents for Medicare Annual/Subsequent preventive examination.  Visit Complete: Virtual  I connected with  [name] on 06/04/23 by a audio enabled telemedicine application and verified that I am speaking with the correct person using two identifiers.  Patient Location: Home  Provider Location: Home Office  I discussed the limitations of evaluation and management by telemedicine. The patient expressed understanding and agreed to proceed.  Because this visit was a virtual/telehealth visit, some criteria may be missing or patient reported. Any vitals not documented were not able to be obtained and vitals that have been documented are patient reported.   Cardiac Risk Factors include: advanced age (>70men, >56 women);dyslipidemia;hypertension;male gender     Objective:    Today's Vitals   06/04/23 1123  Weight: 205 lb (93 kg)  Height: 6\' 3"  (1.905 m)   Body mass index is 25.62 kg/m.     06/04/2023   11:28 AM 05/02/2021    9:23 AM 02/15/2021   10:39 AM 08/17/2020    9:22 AM 09/23/2016    2:38 PM 08/12/2016    3:15 PM 06/03/2016    3:12 PM  Advanced Directives  Does Patient Have a Medical Advance Directive? No No No No No No No  Would patient like information on creating a medical advance directive? Yes (MAU/Ambulatory/Procedural Areas - Information given) Yes (MAU/Ambulatory/Procedural Areas - Information given) No - Patient declined No - Patient declined   No - patient declined information    Current Medications (verified) Outpatient Encounter Medications as of 06/04/2023  Medication Sig   acetaZOLAMIDE (DIAMOX) 125 MG tablet Take 1 tablet (125 mg total) by mouth 2 (two) times daily.   aspirin EC 81 MG tablet Take 81 mg by mouth daily.   atorvastatin (LIPITOR) 40 MG tablet TAKE 1 TABLET BY MOUTH DAILY   Cholecalciferol (VITAMIN D-3) 125 MCG (5000 UT) TABS Take 5,000 Units by mouth 3 (three) times a week.   Cyanocobalamin  (B-12) 1000 MCG CAPS Take 1 tablet by mouth daily.   Multiple Vitamin (MULTIVITAMIN) capsule Take 1 capsule by mouth daily.   Facility-Administered Encounter Medications as of 06/04/2023  Medication   sodium chloride flush (NS) 0.9 % injection 10 mL    Allergies (verified) Sulfa antibiotics   History: Past Medical History:  Diagnosis Date   GERD (gastroesophageal reflux disease)    tums   Hearing loss    History of kidney stones    Hyperlipidemia    Labral tear of shoulder    Lumbar spinal stenosis    Past Surgical History:  Procedure Laterality Date   BACK SURGERY  08/19/2020   COLONOSCOPY     EYE SURGERY  2000   lasic   MOUTH SURGERY     teeth removed, dental implants placed   TONSILLECTOMY     VASECTOMY  1983   Family History  Problem Relation Age of Onset   Cancer Mother        skin   Heart disease Mother    Vision loss Mother    Early death Father    Arthritis Maternal Grandmother    Cancer Maternal Grandmother    Hyperlipidemia Maternal Grandmother    Hypertension Maternal Grandmother    Cancer Maternal Grandfather    Heart disease Paternal Grandfather    Stroke Paternal Grandfather    Social History   Socioeconomic History   Marital status: Married    Spouse name: Not on file   Number  of children: Not on file   Years of education: Not on file   Highest education level: Bachelor's degree (e.g., BA, AB, BS)  Occupational History   Not on file  Tobacco Use   Smoking status: Never   Smokeless tobacco: Never  Vaping Use   Vaping status: Never Used  Substance and Sexual Activity   Alcohol use: Yes    Alcohol/week: 9.0 standard drinks of alcohol    Types: 5 Glasses of wine, 4 Cans of beer per week   Drug use: No   Sexual activity: Yes    Birth control/protection: Surgical    Comment: vasectomy  Other Topics Concern   Not on file  Social History Narrative   Not on file   Social Determinants of Health   Financial Resource Strain: Low Risk   (06/04/2023)   Overall Financial Resource Strain (CARDIA)    Difficulty of Paying Living Expenses: Not hard at all  Food Insecurity: No Food Insecurity (06/04/2023)   Hunger Vital Sign    Worried About Running Out of Food in the Last Year: Never true    Ran Out of Food in the Last Year: Never true  Transportation Needs: No Transportation Needs (06/04/2023)   PRAPARE - Administrator, Civil Service (Medical): No    Lack of Transportation (Non-Medical): No  Physical Activity: Sufficiently Active (06/04/2023)   Exercise Vital Sign    Days of Exercise per Week: 5 days    Minutes of Exercise per Session: 60 min  Stress: No Stress Concern Present (06/04/2023)   [employer] of Occupational Health - Occupational Stress Questionnaire    Feeling of Stress : Not at all  Social Connections: Moderately Integrated (06/04/2023)   Social Connection and Isolation Panel [NHANES]    Frequency of Communication with Friends and Family: More than three times a week    Frequency of Social Gatherings with Friends and Family: Three times a week    Attends Religious Services: Never    Active Member of Clubs or Organizations: Yes    Attends Engineer, structural: More than 4 times per year    Marital Status: Married    Tobacco Counseling Counseling given: Not Answered   Clinical Intake:  Pre-visit preparation completed: Yes  Pain : No/denies pain     Diabetes: No  How often do you need to have someone help you when you read instructions, pamphlets, or other written materials from your doctor or pharmacy?: 1 - Never  Interpreter Needed?: No  Information entered by :: Kandis Fantasia LPN   Activities of Daily Living    06/04/2023   11:24 AM  In your present state of health, do you have any difficulty performing the following activities:  Hearing? 0  Vision? 0  Difficulty concentrating or making decisions? 0  Walking or climbing stairs? 0  Dressing or bathing? 0  Doing  errands, shopping? 0  Preparing Food and eating ? N  Using the Toilet? N  In the past six months, have you accidently leaked urine? N  Do you have problems with loss of bowel control? N  Managing your Medications? N  Managing your Finances? N  Housekeeping or managing your Housekeeping? N    Patient Care Team: Donita Brooks, MD as PCP - General (Family Medicine) Wendall Stade, MD as PCP - Cardiology (Cardiology) Gelene Mink, OD as Referring Physician (Optometry)  Indicate any recent Medical Services you may have received from other than [facility] providers in the  past year (date may be approximate).     Assessment:   This is a routine wellness examination for Blaiden.  Hearing/Vision screen Hearing Screening - Comments:: Hard of hearing; bilateral hearing aids  Vision Screening - Comments:: up to date with routine eye exams with Dr. Sharlot Gowda     Goals Addressed   None   Depression Screen    06/04/2023   11:26 AM 06/04/2023   11:25 AM 05/08/2023    8:52 AM 05/06/2022   10:26 AM 05/02/2021    9:22 AM 02/15/2021   10:45 AM 04/30/2020   10:38 AM  PHQ 2/9 Scores  PHQ - 2 Score 0 0 0 0 0 0 0    Fall Risk    06/04/2023   11:30 AM 05/08/2023    8:52 AM 05/06/2022   10:26 AM 05/02/2021    9:22 AM 02/15/2021   10:43 AM  Fall Risk   Falls in the past year? 0 0 0 0 0  Number falls in past yr: 0  0 0 0  Injury with Fall? 0  0 0 0  Risk for fall due to : No Fall Risks  No Fall Risks Orthopedic patient;No Fall Risks No Fall Risks  Follow up Falls prevention discussed;Education provided;Falls evaluation completed  Falls prevention discussed Falls evaluation completed Falls evaluation completed;Falls prevention discussed    MEDICARE RISK AT HOME: Medicare Risk at Home Any stairs in or around the home?: No If so, are there any without handrails?: No Home free of loose throw rugs in walkways, pet beds, electrical cords, etc?: Yes Adequate lighting in your home to reduce risk of falls?:  Yes Life alert?: No Use of a cane, walker or w/c?: No Grab bars in the bathroom?: Yes Shower chair or bench in shower?: No Elevated toilet seat or a handicapped toilet?: Yes  TIMED UP AND GO:  Was the test performed?  No    Cognitive Function:        06/04/2023   11:35 AM  6CIT Screen  What Year? 0 points  What month? 0 points  What time? 0 points  Count back from 20 0 points  Months in reverse 0 points  Repeat phrase 0 points  Total Score 0 points    Immunizations Immunization History  Administered Date(s) Administered   Fluad Quad(high Dose 65+) 07/03/2020   Influenza Split 05/21/2015   Influenza, Seasonal, Injecte, Preservative Fre 05/08/2023   Influenza,inj,Quad PF,6+ Mos 05/02/2021   Influenza-Unspecified 06/02/2016, 05/24/2018, 05/27/2019, 05/06/2022   PFIZER Comirnaty(Gray Top)Covid-19 Tri-Sucrose Vaccine 01/15/2021   PFIZER(Purple Top)SARS-COV-2 Vaccination 09/03/2019, 09/23/2019, 05/27/2020   Pfizer(Comirnaty)Fall Seasonal Vaccine 12 years and older 05/06/2022   Pneumococcal Conjugate-13 07/09/2017   Pneumococcal Polysaccharide-23 09/19/2014, 04/30/2020   Tdap 04/01/2014   Zoster Recombinant(Shingrix) 05/08/2023   Zoster, Live 03/07/2014    TDAP status: Up to date  Flu Vaccine status: Up to date  Pneumococcal vaccine status: Up to date  Covid-19 vaccine status: Information provided on how to obtain vaccines.   Qualifies for Shingles Vaccine? Yes   Zostavax completed Yes   Shingrix Completed?: Yes  Screening Tests Health Maintenance  Topic Date Due   COVID-19 Vaccine (6 - 2023-24 season) 05/03/2023   Zoster Vaccines- Shingrix (2 of 2) 07/03/2023   DTaP/Tdap/Td (2 - Td or Tdap) 04/01/2024   Fecal DNA (Cologuard)  06/08/2025   Pneumonia Vaccine 56+ Years old  Completed   INFLUENZA VACCINE  Completed   Hepatitis C Screening  Completed   HPV VACCINES  Aged  Out   Colonoscopy  Discontinued    Health Maintenance  Health Maintenance Due  Topic  Date Due   COVID-19 Vaccine (6 - 2023-24 season) 05/03/2023    Colorectal cancer screening: Type of screening: Cologuard. Completed 06/08/22. Repeat every 3 years  Lung Cancer Screening: (Low Dose CT Chest recommended if Age 20-80 years, 20 pack-year currently smoking OR have quit w/in 15years.) does not qualify.   Lung Cancer Screening Referral: n/a  Additional Screening:  Hepatitis C Screening: does qualify; Completed 07/06/17  Vision Screening: Recommended annual ophthalmology exams for early detection of glaucoma and other disorders of the eye. Is the patient up to date with their annual eye exam?  Yes  Who is the provider or what is the name of the office in which the patient attends annual eye exams? Dr. Sharlot Gowda  If pt is not established with a provider, would they like to be referred to a provider to establish care? No .   Dental Screening: Recommended annual dental exams for proper oral hygiene  Community Resource Referral / Chronic Care Management: CRR required this visit?  No   CCM required this visit?  No     Plan:     I have personally reviewed and noted the following in the patient's chart:   Medical and social history Use of alcohol, tobacco or illicit drugs  Current medications and supplements including opioid prescriptions. Patient is not currently taking opioid prescriptions. Functional ability and status Nutritional status Physical activity Advanced directives List of other physicians Hospitalizations, surgeries, and ER visits in previous 12 months Vitals Screenings to include cognitive, depression, and falls Referrals and appointments  In addition, I have reviewed and discussed with patient certain preventive protocols, quality metrics, and best practice recommendations. A written personalized care plan for preventive services as well as general preventive health recommendations were provided to patient.     Kandis Fantasia [place], [place]   24/11/100    After Visit Summary: (MyChart) Due to this being a telephonic visit, the after visit summary with patients personalized plan was offered to patient via MyChart   Nurse Notes: Patient would like to notify pcp that he had one episode of blurred vision which lasted for 10 minutes and happened while driving extended distance on vacation. No further occurences.  Is up to date with eye care.

## 2023-06-11 ENCOUNTER — Other Ambulatory Visit: Payer: Self-pay | Admitting: Family Medicine

## 2023-06-12 NOTE — Telephone Encounter (Signed)
Requested Prescriptions  Pending Prescriptions Disp Refills   atorvastatin (LIPITOR) 40 MG tablet [Pharmacy Med Name: Atorvastatin Calcium 40 MG Oral Tablet] 90 tablet 1    Sig: TAKE 1 TABLET BY MOUTH DAILY     Cardiovascular:  Antilipid - Statins Failed - 06/11/2023 10:57 PM      Failed - Valid encounter within last 12 months    Recent Outpatient Visits           2 years ago Encounter for Medicare annual wellness exam   [facility] Pickard, Priscille Heidelberg, MD   2 years ago Encounter for Medicare annual wellness exam   [facility] Donita Brooks, MD   3 years ago Physical exam, annual   [facility] Tanya Nones Priscille Heidelberg, MD   3 years ago Pharyngitis, unspecified etiology   [facility] Elmore Guise, FNP   4 years ago Physical exam, annual   [facility] Pickard, Priscille Heidelberg, MD       Future Appointments             In 1 month Sharlene Dory, PA-C [facility]   In 11 months Pickard, Priscille Heidelberg, MD [facility]            Failed - Lipid Panel in normal range within the last 12 months    Cholesterol  Date Value Ref Range Status  05/06/2023 193 <200 mg/dL Final   LDL Cholesterol (Calc)  Date Value Ref Range Status  05/06/2023 79 mg/dL (calc) Final    Comment:    Reference range: <100 . Desirable range <100 mg/dL for primary prevention;   <70 mg/dL for patients with CHD or diabetic patients  with > or = 2 CHD risk factors. Marland Kitchen LDL-C is now calculated using the Martin-Hopkins  calculation, which is a validated novel method providing  better accuracy than the Friedewald equation in the  estimation of LDL-C.  Horald Pollen et al. Lenox Ahr. 8295;621(30): 2061-2068  (http://education.[facility].com/faq/FAQ164)    HDL  Date Value Ref Range Status  05/06/2023 101 > OR = 40 mg/dL Final   Triglycerides  Date Value Ref Range  Status  05/06/2023 51 <150 mg/dL Final         Passed - Patient is not pregnant

## 2023-06-27 ENCOUNTER — Encounter: Payer: Self-pay | Admitting: Family Medicine

## 2023-06-27 ENCOUNTER — Other Ambulatory Visit: Payer: Self-pay | Admitting: Medical Genetics

## 2023-06-27 DIAGNOSIS — Z006 Encounter for examination for normal comparison and control in clinical research program: Secondary | ICD-10-CM

## 2023-07-03 ENCOUNTER — Ambulatory Visit (HOSPITAL_COMMUNITY): Payer: BC Managed Care – PPO | Attending: Cardiology

## 2023-07-03 DIAGNOSIS — I350 Nonrheumatic aortic (valve) stenosis: Secondary | ICD-10-CM | POA: Diagnosis not present

## 2023-07-03 LAB — ECHOCARDIOGRAM COMPLETE
AR max vel: 1.24 cm2
AV Area VTI: 1.27 cm2
AV Area mean vel: 1.02 cm2
AV Mean grad: 22 mm[Hg]
AV Peak grad: 41.2 mm[Hg]
Ao pk vel: 3.21 m/s
Area-P 1/2: 3.83 cm2
S' Lateral: 2.5 cm

## 2023-07-09 ENCOUNTER — Other Ambulatory Visit (HOSPITAL_COMMUNITY)
Admission: RE | Admit: 2023-07-09 | Discharge: 2023-07-09 | Disposition: A | Discharge status: Home / Self Care | Payer: BC Managed Care – PPO | Source: Ambulatory Visit | Attending: Oncology | Admitting: Oncology

## 2023-07-09 DIAGNOSIS — Z006 Encounter for examination for normal comparison and control in clinical research program: Secondary | ICD-10-CM | POA: Insufficient documentation

## 2023-07-14 ENCOUNTER — Ambulatory Visit: Payer: BC Managed Care – PPO

## 2023-07-20 ENCOUNTER — Ambulatory Visit (INDEPENDENT_AMBULATORY_CARE_PROVIDER_SITE_OTHER): Payer: BC Managed Care – PPO

## 2023-07-20 DIAGNOSIS — Z23 Encounter for immunization: Secondary | ICD-10-CM | POA: Diagnosis not present

## 2023-07-20 NOTE — Progress Notes (Signed)
Patient is in office today for a nurse visit for Immunization. Patient Injection was given in the  Left deltoid. Patient tolerated injection well. Shingrix vaccine.

## 2023-07-21 ENCOUNTER — Encounter: Payer: Self-pay | Admitting: Physician Assistant

## 2023-07-21 ENCOUNTER — Ambulatory Visit: Payer: BC Managed Care – PPO | Attending: Cardiovascular Disease | Admitting: Physician Assistant

## 2023-07-21 ENCOUNTER — Ambulatory Visit: Payer: BC Managed Care – PPO | Admitting: Nurse Practitioner

## 2023-07-21 VITALS — BP 132/70 | HR 69 | Resp 16 | Ht 75.0 in | Wt 206.0 lb

## 2023-07-21 DIAGNOSIS — M549 Dorsalgia, unspecified: Secondary | ICD-10-CM

## 2023-07-21 DIAGNOSIS — I350 Nonrheumatic aortic (valve) stenosis: Secondary | ICD-10-CM

## 2023-07-21 DIAGNOSIS — I2510 Atherosclerotic heart disease of native coronary artery without angina pectoris: Secondary | ICD-10-CM | POA: Diagnosis not present

## 2023-07-21 DIAGNOSIS — E782 Mixed hyperlipidemia: Secondary | ICD-10-CM | POA: Diagnosis not present

## 2023-07-21 DIAGNOSIS — I77819 Aortic ectasia, unspecified site: Secondary | ICD-10-CM | POA: Diagnosis not present

## 2023-07-21 DIAGNOSIS — Q2112 Patent foramen ovale: Secondary | ICD-10-CM

## 2023-07-21 LAB — HELIX MOLECULAR SCREEN: Genetic Analysis Overall Interpretation: NEGATIVE

## 2023-07-21 LAB — GENECONNECT MOLECULAR SCREEN

## 2023-07-21 NOTE — Patient Instructions (Addendum)
He Medication Instructions:  No change *If you need a refill on your cardiac medications before your next appointment, please call your pharmacy*   Lab Work: Today we will draw Lpa  If you have labs (blood work) drawn today and your tests are completely normal, you will receive your results only by: MyChart Message (if you have MyChart) OR A paper copy in the mail If you have any lab test that is abnormal or we need to change your treatment, we will call you to review the results.   Testing/Procedures: CT scanning for a cardiac calcium score (CAT scanning), is a noninvasive, special x-ray that produces cross-sectional images of the body using x-rays and a computer. CT scans help physicians diagnose and treat medical conditions. For some CT exams, a contrast material is used to enhance visibility in the area of the body being studied. CT scans provide greater clarity and reveal more details than regular x-ray exams.  Follow-Up: At [facility], you and your health needs are our priority.  As part of our continuing mission to provide you with exceptional heart care, we have created designated Provider Care Teams.  These Care Teams include your primary Cardiologist (physician) and Advanced Practice Providers (APPs -  Physician Assistants and Nurse Practitioners) who all work together to provide you with the care you need, when you need it.  We recommend signing up for the patient portal called "MyChart".  Sign up information is provided on this After Visit Summary.  MyChart is used to connect with patients for Virtual Visits (Telemedicine).  Patients are able to view lab/test results, encounter notes, upcoming appointments, etc.  Non-urgent messages can be sent to your provider as well.   To learn more about what you can do with MyChart, go to [email or web address].    Your next appointment:   1 year(s)  Provider:   Charlton Haws, MD  or Jari Favre, PA-C

## 2023-07-21 NOTE — Progress Notes (Signed)
Cardiology Office Note:  .   Date:  07/21/2023  ID:  Patrick Wall, DOB 1950-09-22, MRN [id number] PCP: Donita Brooks, MD  [facility] Providers Cardiologist:  Charlton Haws, MD {  History of Present Illness: .   Patrick Wall is a 72 y.o. male with a past medical history of GERD, hyperlipidemia here for follow-up appointment.  Paternal aunt died at 60 from an MI.  Strong family history.  Father died suddenly in his 30s after snorkeling.  Patient himself is active competing in triathlons.  No cardiac symptoms.  Was taking a statin.  Does triathlons when he runs first mile which is hard then feels better.  Cath 2007 for abnormal Holter and PVCs which was normal.  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  He is married and works in the lab.  3 children and 2 in [place] and 1 in [place].  Calcium score 09/02/2018 which was 424.  Myoview 09/17/2018 was normal no ischemia.  LVEF 59%.  TTE 06/26/2022 with LVEF 65%, mild to moderate AAS and mean gradient 19 mmHg, peak 35.3 mmHg, AVA 1.5 to centimeter squared, DVI 0.42.  Gradients is same as TTE done 07/15/2021.  Aortic root measured 4.3 cm.  Had AL 4 through 5 fusion done 08/20/2020.  Active but limited by back pain.  Did his first do [place] at an [place] March 2021.  Slowing down.  Son is in [place] still.  Discussed doing cardiac CTA a to rule out CAD progression and assess amounts of calcium on AV but was concerned about multiple bills he would get from [facility].  Also endorsed some mental fog with no objective evidence of TIA/CVA.  Seen by primary Dr. Tanya Nones and patient deferred MRI.  Today, he is here for his annual visit with a history of heart disease, presents with intermittent episodes of vision loss while driving. The episodes, described as eyes going out of focus, resolve spontaneously within 5-10 minutes. 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 He also reports knee pain, which they believe may be due to calcific tendonitis. The patient has a history of bilateral knee surgery and is currently experiencing difficulty with running due to the pain. He also mentions a history of aortic stenosis and a high calcium score from a test conducted four years ago. The patient is interested in exploring the potential benefits of Vitamin K2 supplementation for reducing arterial calcification. The patient is currently taking Vitamin D3 supplements and is considering a combination pill of Vitamin K2 and D3.  Reports no shortness of breath nor dyspnea on exertion. Reports no chest pain, pressure, or tightness. No edema, orthopnea, PND. Reports no palpitations.   Discussed the use of AI scribe software for clinical note transcription with the patient, who gave verbal consent to proceed.   ROS: pertinent ROS in HPI  Studies Reviewed: Marland Kitchen   EKG Interpretation Date/Time:  Tuesday July 21 2023 13:51:13 EST Ventricular Rate:  64 PR Interval:  152 QRS Duration:  92 QT Interval:  412 QTC Calculation: 425 R Axis:   75  Text Interpretation: Normal sinus rhythm Possible Left atrial enlargement Left ventricular hypertrophy ( Sokolow-Lyon , Romhilt-Estes ) Nonspecific ST abnormality No previous ECGs available Confirmed by Jari Favre [phone]) on 07/21/2023 2:27:54 PM   CT cardiac scoring 09/02/2018 EXAM: Coronary Calcium Score   TECHNIQUE: The patient was scanned on a Siemens  Somatom 64 slice scanner. Axial non-contrast 3 mm slices were carried out through the heart. The data set was analyzed on a dedicated work station and scored using the Agatson method.   FINDINGS: Non-cardiac: See separate report from [facility].   Ascending aorta: Mildly dilated 3.9 cm   Pericardium: Normal   Coronary arteries: 3 vessel coronary calcium noted   IMPRESSION: Coronary calcium score of 424. This was 49 th percentile  for age and sex matched control.      Physical Exam:   VS:  BP 132/70 (BP Location: Left Arm, Patient Position: Sitting, Cuff Size: Normal)   Pulse 69   Resp 16   Ht 6\' 3"  (1.905 m)   Wt 206 lb (93.4 kg)   SpO2 96%   BMI 25.75 kg/m    Wt Readings from Last 3 Encounters:  07/21/23 206 lb (93.4 kg)  06/04/23 205 lb (93 kg)  05/08/23 205 lb 12.8 oz (93.4 kg)    GEN: Well nourished, well developed in no acute distress NECK: No JVD; No carotid bruits CARDIAC: RRR, + systolic murmurs which radiates to the carotids, rubs, gallops RESPIRATORY:  Clear to auscultation without rales, wheezing or rhonchi  ABDOMEN: Soft, non-tender, non-distended EXTREMITIES:  No edema; No deformity   ASSESSMENT AND PLAN: .   Visual Disturbance   Reports transient episodes of blurred vision while driving. 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 No recent episodes.   -Recommend patient to report these episodes to his primary care provider and optometrist for further evaluation. 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 Patient concerned about potential impact on cardiac health.   -No specific plan discussed in relation to this issue.  Coronary Artery Calcification   Moderate coronary artery calcification noted on calcium score from 2020. Patient interested in potential benefits of Vitamin K2 supplementation for reducing arterial calcification.   -Order Lipoprotein A test to assess for hereditary component.   -Update calcium score for a current baseline before starting Vitamin K2 supplementation.   -Consider Vitamin K2 supplementation in combination with Vitamin D3  Moderate Aortic Stenosis   Recent echocardiogram showed progression to moderate aortic stenosis. No symptoms of lightheadedness, dizziness, or syncope reported.   -Plan for follow-up echocardiogram in 1 year.   -Advise patient  to report any symptoms of lightheadedness, dizziness, or syncope immediately.  Hyperlipidemia   LDL slightly above target at 79. Patient reluctant to increase Lipitor dosage.   -Consider increasing Lipitor dosage pending results of Lipoprotein A test.  General Health Maintenance / Followup Plans   -Continue current regimen of Vitamin D3 supplementation.   -Consider adding Vitamin K2 to current regimen  -Plan for follow-up with Dr. Eden Emms or current provider in 1 year.   Dispo: He can follow-up in a year with Dr. Eden Emms  Signed, Sharlene Dory, PA-C

## 2023-07-22 DIAGNOSIS — E782 Mixed hyperlipidemia: Secondary | ICD-10-CM | POA: Diagnosis not present

## 2023-07-22 DIAGNOSIS — I350 Nonrheumatic aortic (valve) stenosis: Secondary | ICD-10-CM | POA: Diagnosis not present

## 2023-07-22 DIAGNOSIS — I2510 Atherosclerotic heart disease of native coronary artery without angina pectoris: Secondary | ICD-10-CM | POA: Diagnosis not present

## 2023-07-22 DIAGNOSIS — I77819 Aortic ectasia, unspecified site: Secondary | ICD-10-CM | POA: Diagnosis not present

## 2023-07-23 LAB — LIPOPROTEIN A (LPA): Lipoprotein (a): 236.1 nmol/L — ABNORMAL HIGH (ref <75.0)

## 2023-07-27 ENCOUNTER — Telehealth: Payer: Self-pay

## 2023-07-27 MED ORDER — EZETIMIBE 10 MG PO TABS: 10.0000 mg | ORAL_TABLET | Freq: Every day | ORAL | 3 refills | Status: DC

## 2023-07-27 NOTE — Telephone Encounter (Signed)
-----   Message from Sharlene Dory sent at 07/27/2023 11:10 AM EST ----- Mr. Stegen, Your lipoprotein a was elevated which indicates there is a familial component of your hyperlipidemia.  Would recommend LDL less than 70.  We have 2 options.  The first would be to increase your Lipitor to 80 mg daily.  If you are not wanting to increase Lipitor then we can add Zetia 10 mg daily to help augment your current dose of Lipitor.  Let me know which option is more appealing to you.  Thanks!  Sharlene Dory, PA-C

## 2023-07-27 NOTE — Telephone Encounter (Signed)
The patient has been notified of the result and verbalized understanding.  All questions (if any) were answered. Frutoso Schatz, RN 07/27/2023 2:10 PM   Patient would prefer to start on zetia 10 mg daily. Prescription has been sent in.

## 2023-08-13 ENCOUNTER — Ambulatory Visit: Payer: BC Managed Care – PPO | Admitting: Cardiovascular Disease

## 2023-09-10 ENCOUNTER — Ambulatory Visit (HOSPITAL_BASED_OUTPATIENT_CLINIC_OR_DEPARTMENT_OTHER)
Admission: RE | Admit: 2023-09-10 | Discharge: 2023-09-10 | Disposition: A | Discharge status: Home / Self Care | Payer: Self-pay | Source: Ambulatory Visit | Attending: Physician Assistant | Admitting: Physician Assistant

## 2023-09-10 DIAGNOSIS — E782 Mixed hyperlipidemia: Secondary | ICD-10-CM | POA: Insufficient documentation

## 2023-09-10 DIAGNOSIS — I2510 Atherosclerotic heart disease of native coronary artery without angina pectoris: Secondary | ICD-10-CM | POA: Insufficient documentation

## 2023-09-10 DIAGNOSIS — Q2112 Patent foramen ovale: Secondary | ICD-10-CM | POA: Insufficient documentation

## 2023-09-10 DIAGNOSIS — I350 Nonrheumatic aortic (valve) stenosis: Secondary | ICD-10-CM | POA: Insufficient documentation

## 2023-09-10 DIAGNOSIS — I77819 Aortic ectasia, unspecified site: Secondary | ICD-10-CM | POA: Insufficient documentation

## 2023-09-10 DIAGNOSIS — M549 Dorsalgia, unspecified: Secondary | ICD-10-CM | POA: Insufficient documentation

## 2023-09-14 ENCOUNTER — Encounter: Payer: Self-pay | Admitting: Physician Assistant

## 2023-09-14 DIAGNOSIS — M25562 Pain in left knee: Secondary | ICD-10-CM | POA: Diagnosis not present

## 2023-09-14 DIAGNOSIS — S46012D Strain of muscle(s) and tendon(s) of the rotator cuff of left shoulder, subsequent encounter: Secondary | ICD-10-CM | POA: Diagnosis not present

## 2023-09-14 DIAGNOSIS — M25561 Pain in right knee: Secondary | ICD-10-CM | POA: Diagnosis not present

## 2023-09-14 DIAGNOSIS — E782 Mixed hyperlipidemia: Secondary | ICD-10-CM

## 2023-09-14 DIAGNOSIS — I2510 Atherosclerotic heart disease of native coronary artery without angina pectoris: Secondary | ICD-10-CM

## 2023-09-14 DIAGNOSIS — M1712 Unilateral primary osteoarthritis, left knee: Secondary | ICD-10-CM | POA: Diagnosis not present

## 2023-09-22 DIAGNOSIS — L821 Other seborrheic keratosis: Secondary | ICD-10-CM | POA: Diagnosis not present

## 2023-09-22 DIAGNOSIS — L814 Other melanin hyperpigmentation: Secondary | ICD-10-CM | POA: Diagnosis not present

## 2023-09-22 DIAGNOSIS — D225 Melanocytic nevi of trunk: Secondary | ICD-10-CM | POA: Diagnosis not present

## 2023-09-30 DIAGNOSIS — M25561 Pain in right knee: Secondary | ICD-10-CM | POA: Diagnosis not present

## 2023-09-30 DIAGNOSIS — M25562 Pain in left knee: Secondary | ICD-10-CM | POA: Diagnosis not present

## 2023-10-08 DIAGNOSIS — M25562 Pain in left knee: Secondary | ICD-10-CM | POA: Diagnosis not present

## 2023-10-08 DIAGNOSIS — M25561 Pain in right knee: Secondary | ICD-10-CM | POA: Diagnosis not present

## 2023-10-15 DIAGNOSIS — M25561 Pain in right knee: Secondary | ICD-10-CM | POA: Diagnosis not present

## 2023-10-15 DIAGNOSIS — M25562 Pain in left knee: Secondary | ICD-10-CM | POA: Diagnosis not present

## 2023-10-22 DIAGNOSIS — M25561 Pain in right knee: Secondary | ICD-10-CM | POA: Diagnosis not present

## 2023-10-22 DIAGNOSIS — M25562 Pain in left knee: Secondary | ICD-10-CM | POA: Diagnosis not present

## 2023-10-29 DIAGNOSIS — M25561 Pain in right knee: Secondary | ICD-10-CM | POA: Diagnosis not present

## 2023-10-29 DIAGNOSIS — M25562 Pain in left knee: Secondary | ICD-10-CM | POA: Diagnosis not present

## 2023-11-04 DIAGNOSIS — M25562 Pain in left knee: Secondary | ICD-10-CM | POA: Diagnosis not present

## 2023-11-04 DIAGNOSIS — M25561 Pain in right knee: Secondary | ICD-10-CM | POA: Diagnosis not present

## 2023-11-11 ENCOUNTER — Other Ambulatory Visit: Payer: Self-pay | Admitting: Family Medicine

## 2023-11-12 DIAGNOSIS — M25562 Pain in left knee: Secondary | ICD-10-CM | POA: Diagnosis not present

## 2023-11-12 DIAGNOSIS — M25561 Pain in right knee: Secondary | ICD-10-CM | POA: Diagnosis not present

## 2023-11-12 NOTE — Telephone Encounter (Signed)
 Requested Prescriptions  Pending Prescriptions Disp Refills   atorvastatin (LIPITOR) 40 MG tablet [Pharmacy Med Name: Atorvastatin Calcium 40 MG Oral Tablet] 90 tablet 1    Sig: TAKE 1 TABLET BY MOUTH DAILY     Cardiovascular:  Antilipid - Statins Failed - 11/12/2023 11:48 AM      Failed - Valid encounter within last 12 months    Recent Outpatient Visits           2 years ago Encounter for Medicare annual wellness exam   [facility] Pickard, Priscille Heidelberg, MD   2 years ago Encounter for Medicare annual wellness exam   [facility] Donita Brooks, MD   3 years ago Physical exam, annual   [facility] Tanya Nones Priscille Heidelberg, MD   3 years ago Pharyngitis, unspecified etiology   [facility] Elmore Guise, FNP   4 years ago Physical exam, annual   [facility] Pickard, Priscille Heidelberg, MD       Future Appointments             In 5 months Pickard, Priscille Heidelberg, MD [facility]            Failed - Lipid Panel in normal range within the last 12 months    Cholesterol  Date Value Ref Range Status  05/06/2023 193 <200 mg/dL Final   LDL Cholesterol (Calc)  Date Value Ref Range Status  05/06/2023 79 mg/dL (calc) Final    Comment:    Reference range: <100 . Desirable range <100 mg/dL for primary prevention;   <70 mg/dL for patients with CHD or diabetic patients  with > or = 2 CHD risk factors. Marland Kitchen LDL-C is now calculated using the Martin-Hopkins  calculation, which is a validated novel method providing  better accuracy than the Friedewald equation in the  estimation of LDL-C.  Horald Pollen et al. Lenox Ahr. 6578;469(62): 2061-2068  (http://education.[facility].com/faq/FAQ164)    HDL  Date Value Ref Range Status  05/06/2023 101 > OR = 40 mg/dL Final   Triglycerides  Date Value Ref Range Status  05/06/2023 51 <150 mg/dL Final         Passed - Patient is not pregnant

## 2023-11-16 ENCOUNTER — Other Ambulatory Visit: Payer: Self-pay

## 2023-11-16 DIAGNOSIS — E782 Mixed hyperlipidemia: Secondary | ICD-10-CM

## 2023-11-16 DIAGNOSIS — I2510 Atherosclerotic heart disease of native coronary artery without angina pectoris: Secondary | ICD-10-CM | POA: Diagnosis not present

## 2023-11-16 DIAGNOSIS — M170 Bilateral primary osteoarthritis of knee: Secondary | ICD-10-CM | POA: Diagnosis not present

## 2023-11-16 LAB — LIPID PANEL
Chol/HDL Ratio: 1.6 ratio (ref 0.0–5.0)
Cholesterol, Total: 168 mg/dL (ref 100–199)
HDL: 104 mg/dL (ref >39)
LDL Chol Calc (NIH): 54 mg/dL (ref 0–99)
Triglycerides: 44 mg/dL (ref 0–149)
VLDL Cholesterol Cal: 10 mg/dL (ref 5–40)

## 2023-11-16 LAB — HEPATIC FUNCTION PANEL
ALT: 30 IU/L (ref 0–44)
AST: 50 IU/L — ABNORMAL HIGH (ref 0–40)
Albumin: 4.5 g/dL (ref 3.8–4.8)
Alkaline Phosphatase: 42 IU/L — ABNORMAL LOW (ref 44–121)
Bilirubin Total: 1 mg/dL (ref 0.0–1.2)
Bilirubin, Direct: 0.4 mg/dL (ref 0.00–0.40)
Total Protein: 6.4 g/dL (ref 6.0–8.5)

## 2023-11-17 ENCOUNTER — Encounter: Payer: Self-pay | Admitting: Physician Assistant

## 2023-11-24 DIAGNOSIS — H25013 Cortical age-related cataract, bilateral: Secondary | ICD-10-CM | POA: Diagnosis not present

## 2023-11-24 DIAGNOSIS — H35373 Puckering of macula, bilateral: Secondary | ICD-10-CM | POA: Diagnosis not present

## 2023-12-08 ENCOUNTER — Other Ambulatory Visit: Payer: Self-pay

## 2023-12-08 ENCOUNTER — Encounter (HOSPITAL_COMMUNITY): Payer: Self-pay | Admitting: Emergency Medicine

## 2023-12-08 ENCOUNTER — Emergency Department (HOSPITAL_COMMUNITY)
Admission: EM | Admit: 2023-12-08 | Discharge: 2023-12-09 | Disposition: A | Discharge status: Home / Self Care | Attending: Emergency Medicine | Admitting: Emergency Medicine

## 2023-12-08 ENCOUNTER — Emergency Department (HOSPITAL_COMMUNITY)

## 2023-12-08 DIAGNOSIS — Z7982 Long term (current) use of aspirin: Secondary | ICD-10-CM | POA: Diagnosis not present

## 2023-12-08 DIAGNOSIS — M160 Bilateral primary osteoarthritis of hip: Secondary | ICD-10-CM | POA: Diagnosis not present

## 2023-12-08 DIAGNOSIS — S61216A Laceration without foreign body of right little finger without damage to nail, initial encounter: Secondary | ICD-10-CM | POA: Diagnosis not present

## 2023-12-08 DIAGNOSIS — R9431 Abnormal electrocardiogram [ECG] [EKG]: Secondary | ICD-10-CM | POA: Diagnosis not present

## 2023-12-08 DIAGNOSIS — Y9302 Activity, running: Secondary | ICD-10-CM | POA: Insufficient documentation

## 2023-12-08 DIAGNOSIS — S299XXA Unspecified injury of thorax, initial encounter: Secondary | ICD-10-CM | POA: Diagnosis not present

## 2023-12-08 DIAGNOSIS — W01198A Fall on same level from slipping, tripping and stumbling with subsequent striking against other object, initial encounter: Secondary | ICD-10-CM | POA: Diagnosis not present

## 2023-12-08 DIAGNOSIS — Z23 Encounter for immunization: Secondary | ICD-10-CM | POA: Insufficient documentation

## 2023-12-08 DIAGNOSIS — M79641 Pain in right hand: Secondary | ICD-10-CM | POA: Diagnosis not present

## 2023-12-08 DIAGNOSIS — S0990XA Unspecified injury of head, initial encounter: Secondary | ICD-10-CM | POA: Diagnosis not present

## 2023-12-08 DIAGNOSIS — I10 Essential (primary) hypertension: Secondary | ICD-10-CM | POA: Diagnosis not present

## 2023-12-08 DIAGNOSIS — S0181XA Laceration without foreign body of other part of head, initial encounter: Secondary | ICD-10-CM

## 2023-12-08 DIAGNOSIS — S0511XA Contusion of eyeball and orbital tissues, right eye, initial encounter: Secondary | ICD-10-CM | POA: Diagnosis not present

## 2023-12-08 DIAGNOSIS — W19XXXA Unspecified fall, initial encounter: Secondary | ICD-10-CM | POA: Diagnosis not present

## 2023-12-08 DIAGNOSIS — S01111A Laceration without foreign body of right eyelid and periocular area, initial encounter: Secondary | ICD-10-CM | POA: Insufficient documentation

## 2023-12-08 DIAGNOSIS — S61411A Laceration without foreign body of right hand, initial encounter: Secondary | ICD-10-CM | POA: Diagnosis not present

## 2023-12-08 DIAGNOSIS — R102 Pelvic and perineal pain: Secondary | ICD-10-CM | POA: Diagnosis not present

## 2023-12-08 DIAGNOSIS — I499 Cardiac arrhythmia, unspecified: Secondary | ICD-10-CM | POA: Diagnosis not present

## 2023-12-08 DIAGNOSIS — S01311A Laceration without foreign body of right ear, initial encounter: Secondary | ICD-10-CM | POA: Diagnosis not present

## 2023-12-08 DIAGNOSIS — S199XXA Unspecified injury of neck, initial encounter: Secondary | ICD-10-CM | POA: Diagnosis not present

## 2023-12-08 LAB — COMPREHENSIVE METABOLIC PANEL WITH GFR
ALT: 29 U/L (ref 0–44)
AST: 28 U/L (ref 15–41)
Albumin: 4.3 g/dL (ref 3.5–5.0)
Alkaline Phosphatase: 33 U/L — ABNORMAL LOW (ref 38–126)
Anion gap: 10 (ref 5–15)
BUN: 24 mg/dL — ABNORMAL HIGH (ref 8–23)
CO2: 23 mmol/L (ref 22–32)
Calcium: 9.6 mg/dL (ref 8.9–10.3)
Chloride: 107 mmol/L (ref 98–111)
Creatinine, Ser: 1.08 mg/dL (ref 0.61–1.24)
GFR, Estimated: >60 mL/min (ref >60)
Glucose, Bld: 104 mg/dL — ABNORMAL HIGH (ref 70–99)
Potassium: 4.2 mmol/L (ref 3.5–5.1)
Sodium: 140 mmol/L (ref 135–145)
Total Bilirubin: 1.2 mg/dL (ref 0.0–1.2)
Total Protein: 6.7 g/dL (ref 6.5–8.1)

## 2023-12-08 LAB — TROPONIN I (HIGH SENSITIVITY)
Troponin I (High Sensitivity): 58 ng/L — ABNORMAL HIGH (ref <18)
Troponin I (High Sensitivity): 58 ng/L — ABNORMAL HIGH (ref <18)

## 2023-12-08 LAB — CBC WITH DIFFERENTIAL/PLATELET
Abs Immature Granulocytes: 0.01 10*3/uL (ref 0.00–0.07)
Basophils Absolute: 0 10*3/uL (ref 0.0–0.1)
Basophils Relative: 0 %
Eosinophils Absolute: 0.1 10*3/uL (ref 0.0–0.5)
Eosinophils Relative: 1 %
HCT: 41.7 % (ref 39.0–52.0)
Hemoglobin: 14.3 g/dL (ref 13.0–17.0)
Immature Granulocytes: 0 %
Lymphocytes Relative: 31 %
Lymphs Abs: 1.3 10*3/uL (ref 0.7–4.0)
MCH: 31.8 pg (ref 26.0–34.0)
MCHC: 34.3 g/dL (ref 30.0–36.0)
MCV: 92.9 fL (ref 80.0–100.0)
Monocytes Absolute: 0.4 10*3/uL (ref 0.1–1.0)
Monocytes Relative: 9 %
Neutro Abs: 2.4 10*3/uL (ref 1.7–7.7)
Neutrophils Relative %: 59 %
Platelets: 154 10*3/uL (ref 150–400)
RBC: 4.49 MIL/uL (ref 4.22–5.81)
RDW: 12.7 % (ref 11.5–15.5)
WBC: 4.1 10*3/uL (ref 4.0–10.5)
nRBC: 0 % (ref 0.0–0.2)

## 2023-12-08 MED ORDER — LACTATED RINGERS IV BOLUS
1000.0000 mL | Freq: Once | INTRAVENOUS | Status: AC
  Administered 2023-12-08: 1000 mL via INTRAVENOUS

## 2023-12-08 MED ORDER — MORPHINE SULFATE (PF) 4 MG/ML IV SOLN
4.0000 mg | Freq: Once | INTRAVENOUS | Status: AC
  Administered 2023-12-08: 4 mg via INTRAVENOUS
  Filled 2023-12-08: qty 1

## 2023-12-08 MED ORDER — LIDOCAINE HCL 2 % IJ SOLN
10.0000 mL | Freq: Once | INTRAMUSCULAR | Status: AC
  Administered 2023-12-08: 200 mg via INTRADERMAL
  Filled 2023-12-08: qty 20

## 2023-12-08 MED ORDER — CEFAZOLIN SODIUM-DEXTROSE 2-4 GM/100ML-% IV SOLN
2.0000 g | Freq: Once | INTRAVENOUS | Status: AC
  Administered 2023-12-08: 2 g via INTRAVENOUS
  Filled 2023-12-08: qty 100

## 2023-12-08 MED ORDER — ONDANSETRON HCL 4 MG/2ML IJ SOLN
4.0000 mg | Freq: Once | INTRAMUSCULAR | Status: AC
  Administered 2023-12-08: 4 mg via INTRAVENOUS
  Filled 2023-12-08: qty 2

## 2023-12-08 MED ORDER — TETANUS-DIPHTH-ACELL PERTUSSIS 5-2.5-18.5 LF-MCG/0.5 IM SUSY
0.5000 mL | PREFILLED_SYRINGE | Freq: Once | INTRAMUSCULAR | Status: AC
Start: 2023-12-08 — End: 2023-12-08
  Administered 2023-12-08: 0.5 mL via INTRAMUSCULAR
  Filled 2023-12-08: qty 0.5

## 2023-12-08 NOTE — ED Triage Notes (Signed)
 Patient BIB [facility] c/o fall while running with LOC.  Patient reports he was running, tripped over a tree root, fell and hit his head, and briefly knocked himself out.  Patient has lac above right eye and lac on right ear along with bruising around right eye.  Patient also has abrasion to right shin and lac to right hand/4th finger.

## 2023-12-08 NOTE — ED Provider Notes (Signed)
 Received patient in turnover from Dr. Silverio Lay.  Please see their note for further details of Hx, PE.  Briefly patient is a 73 y.o. male with a Fall and Loss of Consciousness .  Patient with fall.  LOC thought to be likely post.  Plan for CT reads.  Likely home if no acute findings.  CT head and C spine negative for acute intraspinal or intracranial abnormalities.  Patient d/c home.  PCP follow up.     Melene Plan, DO 12/09/23 [phone]

## 2023-12-08 NOTE — ED Provider Notes (Signed)
 [place] [facility] AT [facility] Provider Note   CSN: [id number] Arrival date & time: 12/08/23  1936     History  Chief Complaint  Patient presents with   Fall   Loss of Consciousness    Patrick Wall is a 73 y.o. male history of hyperlipidemia, here presenting with fall.  Patient was running on a trail and did not remember what happened.  Per his wife was running next to him, he tripped over a tree root and hit his head.  Patient was noted to have laceration above his eyebrow and also of the right hand.  Patient apparently had repetitive questioning per EMS.  Patient is not on blood thinners and just on baby aspirin.  The history is provided by the patient and the EMS personnel.       Home Medications Prior to Admission medications   Medication Sig Start Date End Date Taking? Authorizing Provider  acetaZOLAMIDE (DIAMOX) 125 MG tablet Take 1 tablet (125 mg total) by mouth 2 (two) times daily. 11/27/22   Donita Brooks, MD  aspirin EC 81 MG tablet Take 81 mg by mouth daily.    [provider]  atorvastatin (LIPITOR) 40 MG tablet TAKE 1 TABLET BY MOUTH DAILY 11/12/23   Donita Brooks, MD  Cholecalciferol (VITAMIN D-3) 125 MCG (5000 UT) TABS Take 5,000 Units by mouth 3 (three) times a week.    [provider]  Cyanocobalamin (B-12) 1000 MCG CAPS Take 1 tablet by mouth daily. 06/01/22   [provider]  ezetimibe (ZETIA) 10 MG tablet Take 1 tablet (10 mg total) by mouth daily. 07/27/23 10/25/23  Sharlene Dory, PA-C  Multiple Vitamin (MULTIVITAMIN) capsule Take 1 capsule by mouth daily.    [provider]      Allergies    Sulfa antibiotics    Review of Systems   Review of Systems  Skin:  Positive for wound.  Neurological:  Positive for dizziness.  All other systems reviewed and are negative.   Physical Exam Updated Vital Signs BP (!) 175/107   Pulse 64   Temp (!) 97.4 F (36.3 C) (Oral)   Resp 12   Wt 95 kg    SpO2 99%   BMI 26.18 kg/m  Physical Exam Vitals and nursing note reviewed.  HENT:     Head:     Comments: Patient has a 3 cm laceration of the right eyebrow.  Patient also has 2 cm laceration of the right earlobe but there is no obvious laceration of the cartilage.    Nose: Nose normal.     Mouth/Throat:     Mouth: Mucous membranes are moist.  Eyes:     Extraocular Movements: Extraocular movements intact.     Pupils: Pupils are equal, round, and reactive to light.  Cardiovascular:     Rate and Rhythm: Normal rate and regular rhythm.     Pulses: Normal pulses.     Heart sounds: Normal heart sounds.  Pulmonary:     Effort: Pulmonary effort is normal.     Breath sounds: Normal breath sounds.  Abdominal:     General: Abdomen is flat.     Palpations: Abdomen is soft.  Musculoskeletal:     Cervical back: Normal range of motion and neck supple.     Comments: Patient has a 2 cm laceration of the right fifth PIP joint.  Patient is able to flex and extend the joint and there is no obvious tendons exposed.  Patient has a skin tear of the right palm area  Neurological:     General: No focal deficit present.     Mental Status: He is alert and oriented to person, place, and time.  Psychiatric:        Mood and Affect: Mood normal.        Behavior: Behavior normal.     ED Results / Procedures / Treatments   Labs (all labs ordered are listed, but only abnormal results are displayed) Labs Reviewed  COMPREHENSIVE METABOLIC PANEL WITH GFR - Abnormal; Notable for the following components:      Result Value   Glucose, Bld 104 (*)    BUN 24 (*)    Alkaline Phosphatase 33 (*)    All other components within normal limits  TROPONIN I (HIGH SENSITIVITY) - Abnormal; Notable for the following components:   Troponin I (High Sensitivity) 58 (*)    All other components within normal limits  CBC WITH DIFFERENTIAL/PLATELET  TROPONIN I (HIGH SENSITIVITY)    EKG EKG  Interpretation Date/Time:  Tuesday December 08 2023 19:46:04 EDT Ventricular Rate:  59 PR Interval:  169 QRS Duration:  97 QT Interval:  464 QTC Calculation: 460 R Axis:   78  Text Interpretation: Sinus rhythm Ventricular premature complex RSR' in V1 or V2, probably normal variant Left ventricular hypertrophy ST elevation, consider anterior injury No significant change since last tracing Confirmed by Richardean Canal [phone]) on 12/08/2023 7:47:15 PM  Radiology No results found.  Procedures Procedures    LACERATION REPAIR Performed by: Richardean Canal Authorized by: Richardean Canal Consent: Verbal consent obtained. Risks and benefits: risks, benefits and alternatives were discussed Consent given by: patient Patient identity confirmed: provided demographic data Prepped and Draped in normal sterile fashion Wound explored  Laceration Location: R eyebrow  Laceration Length: 3 cm  No Foreign Bodies seen or palpated  Anesthesia: local infiltration  Local anesthetic: lidocaine 2% no epinephrine  Anesthetic total: 5 ml  Irrigation method: syringe Amount of cleaning: standard  Skin closure: 5-0 ethilon   Number of sutures: 4  Technique: simple interrupted   Patient tolerance: Patient tolerated the procedure well with no immediate complications.   LACERATION REPAIR Performed by: Richardean Canal Authorized by: Richardean Canal Consent: Verbal consent obtained. Risks and benefits: risks, benefits and alternatives were discussed Consent given by: patient Patient identity confirmed: provided demographic data Prepped and Draped in normal sterile fashion Wound explored  Laceration Location: R ear lobe   Laceration Length: 2 cm  No Foreign Bodies seen or palpated  Anesthesia: local infiltration  Local anesthetic: lidocaine 2% no epinephrine  Anesthetic total: 5 ml  Irrigation method: syringe Amount of cleaning: standard  Skin closure: 5-0 ethilon  Number of sutures: 3  Technique:  simple interrupted   Patient tolerance: Patient tolerated the procedure well with no immediate complications.  LACERATION REPAIR Performed by: Richardean Canal Authorized by: Richardean Canal Consent: Verbal consent obtained. Risks and benefits: risks, benefits and alternatives were discussed Consent given by: patient Patient identity confirmed: provided demographic data Prepped and Draped in normal sterile fashion Wound explored  Laceration Location: R 5th finger   Laceration Length: 3cm  No Foreign Bodies seen or palpated  Anesthesia: local infiltration  Local anesthetic: lidocaine 2% no epinephrine  Anesthetic total: 5 ml  Irrigation method: syringe Amount of cleaning: standard  Skin closure: 5-0 ethilon  Number of sutures: 4  Technique: simple interrupted   Patient tolerance: Patient tolerated  the procedure well with no immediate complications.   Medications Ordered in ED Medications  Tdap (BOOSTRIX) injection 0.5 mL (0.5 mLs Intramuscular Given 12/08/23 1958)  lactated ringers bolus 1,000 mL (0 mLs Intravenous Stopped 12/08/23 2058)  lidocaine (XYLOCAINE) 2 % (with pres) injection 200 mg (200 mg Intradermal Given 12/08/23 2001)  morphine (PF) 4 MG/ML injection 4 mg (4 mg Intravenous Given 12/08/23 2058)  ondansetron (ZOFRAN) injection 4 mg (4 mg Intravenous Given 12/08/23 2058)  ceFAZolin (ANCEF) IVPB 2g/100 mL premix (2 g Intravenous New Bag/Given 12/08/23 2104)    ED Course/ Medical Decision Making/ A&P                                 Medical Decision Making Quentyn Kolbeck is a 73 y.o. male here presenting with fall.  Patient had a fall and possibly had a syncopal episode afterwards.  Concern for possible subdural hemorrhage versus subarachnoid versus ACS.  Plan to get CBC CMP and troponin x 2 and CT head and cervical spine.  10:59 PM Reviewed patient's x-rays and they were unremarkable.  CT head and cervical spine is pending.  Initial troponin was 58.  Second troponin pending.   Signed out to Dr. Adela Lank pending repeat troponin and CT results.   Amount and/or Complexity of Data Reviewed Labs: ordered. Radiology: ordered.  Risk Prescription drug management.    Final Clinical Impression(s) / ED Diagnoses Final diagnoses:  None    Rx / DC Orders ED Discharge Orders     None         Charlynne Pander, MD 12/08/23 2302

## 2023-12-09 MED ORDER — CEPHALEXIN 500 MG PO CAPS: 500.0000 mg | ORAL_CAPSULE | Freq: Four times a day (QID) | ORAL | 0 refills | Status: DC

## 2023-12-09 NOTE — Discharge Instructions (Addendum)
 Please return for redness drainage or if you develop a fever.  Please follow-up with family doctor in the office.  The sutures placed in your face and ears to be removed in 3 to 5 days.  This can be done here at urgent care or at your family doctor's office.  The sutures placed in your hand typically removed between day 7 and 10.   The area can get wet but not fully immersed underwater.  No scrubbing.  If you really want to clean it you can apply a half-and-half hydrogen peroxide solution with water on a Q-tip.  You can apply an ointment a couple times a day this could be as simple as Vaseline but could also be an antibiotic ointment if you wish.  Once it is healed please try to avoid prolonged sun exposure use sunscreen.  Gells that have silicone antigens have been shown to reduce scarring in some research.

## 2023-12-15 ENCOUNTER — Ambulatory Visit: Admitting: Family Medicine

## 2023-12-15 ENCOUNTER — Encounter: Payer: Self-pay | Admitting: Family Medicine

## 2023-12-15 VITALS — BP 118/70 | HR 48 | Ht 75.0 in | Wt 207.0 lb

## 2023-12-15 DIAGNOSIS — Z4802 Encounter for removal of sutures: Secondary | ICD-10-CM | POA: Diagnosis not present

## 2023-12-15 DIAGNOSIS — S0181XD Laceration without foreign body of other part of head, subsequent encounter: Secondary | ICD-10-CM | POA: Diagnosis not present

## 2023-12-15 MED ORDER — MUPIROCIN 2 % EX OINT: 1.0000 | TOPICAL_OINTMENT | Freq: Two times a day (BID) | CUTANEOUS | 3 refills | Status: AC

## 2023-12-15 NOTE — Progress Notes (Signed)
 Subjective:    Patient ID: Infant Patrick Wall, male    DOB: 30-Aug-1951, 73 y.o.   MRN: [id number]  Dizziness   Patient tripped while running on April 8 fell and hit his head.  He sustained a laceration above his right eyebrow that was closed with 4 sutures.  He sustained a laceration inside his right helix that was closed with 3 sutures.  He sustained a laceration on the dorsum of the right fifth PIP joint that was closed with 4 sutures.  He suffered a grade 1 concussion.  He still has some mild dizziness and occasional headache with activity but otherwise is doing well.  He is here today for suture removal.   Past Medical History:  Diagnosis Date   GERD (gastroesophageal reflux disease)    tums   Hearing loss    History of kidney stones    Hyperlipidemia    Labral tear of shoulder    Lumbar spinal stenosis    Past Surgical History:  Procedure Laterality Date   BACK SURGERY  08/19/2020   COLONOSCOPY     EYE SURGERY  2000   lasic   MOUTH SURGERY     teeth removed, dental implants placed   TONSILLECTOMY     VASECTOMY  1983   Current Outpatient Medications on File Prior to Visit  Medication Sig Dispense Refill   aspirin EC 81 MG tablet Take 81 mg by mouth daily.     atorvastatin (LIPITOR) 40 MG tablet TAKE 1 TABLET BY MOUTH DAILY 90 tablet 1   cephALEXin (KEFLEX) 500 MG capsule Take 1 capsule (500 mg total) by mouth 4 (four) times daily. 20 capsule 0   Cyanocobalamin (B-12) 1000 MCG CAPS Take 1 tablet by mouth daily.     Multiple Vitamin (MULTIVITAMIN) capsule Take 1 capsule by mouth daily.     ezetimibe (ZETIA) 10 MG tablet Take 1 tablet (10 mg total) by mouth daily. 90 tablet 3   Current Facility-Administered Medications on File Prior to Visit  Medication Dose Route Frequency Provider Last Rate Last Admin   sodium chloride flush (NS) 0.9 % injection 10 mL  10 mL Intravenous PRN Wendall Stade, MD   20 mL at 01/08/21 1535   Allergies  Allergen Reactions   Sulfa Antibiotics      Unknown reaction   Social History   Socioeconomic History   Marital status: Married    Spouse name: Not on file   Number of children: Not on file   Years of education: Not on file   Highest education level: Bachelor's degree (e.g., BA, AB, BS)  Occupational History   Not on file  Tobacco Use   Smoking status: Never   Smokeless tobacco: Never  Vaping Use   Vaping status: Never Used  Substance and Sexual Activity   Alcohol use: Yes    Alcohol/week: 9.0 standard drinks of alcohol    Types: 5 Glasses of wine, 4 Cans of beer per week   Drug use: No   Sexual activity: Yes    Birth control/protection: Surgical    Comment: vasectomy  Other Topics Concern   Not on file  Social History Narrative   Not on file   Social Drivers of Health   Financial Resource Strain: Low Risk  (12/11/2023)   Overall Financial Resource Strain (CARDIA)    Difficulty of Paying Living Expenses: Not hard at all  Food Insecurity: No Food Insecurity (12/11/2023)   Hunger Vital Sign    Worried About Running  Out of Food in the Last Year: Never true    Ran Out of Food in the Last Year: Never true  Transportation Needs: No Transportation Needs (12/11/2023)   PRAPARE - Administrator, Civil Service (Medical): No    Lack of Transportation (Non-Medical): No  Physical Activity: Sufficiently Active (12/11/2023)   Exercise Vital Sign    Days of Exercise per Week: 7 days    Minutes of Exercise per Session: 40 min  Stress: No Stress Concern Present (12/11/2023)   [employer] of Occupational Health - Occupational Stress Questionnaire    Feeling of Stress : Not at all  Social Connections: Moderately Isolated (12/11/2023)   Social Connection and Isolation Panel [NHANES]    Frequency of Communication with Friends and Family: Once a week    Frequency of Social Gatherings with Friends and Family: Once a week    Attends Religious Services: Never    Database administrator or Organizations: No    Attends  Engineer, structural: More than 4 times per year    Marital Status: Married  Catering manager Violence: Not At Risk (06/04/2023)   Humiliation, Afraid, Rape, and Kick questionnaire    Fear of Current or Ex-Partner: No    Emotionally Abused: No    Physically Abused: No    Sexually Abused: No   Family History  Problem Relation Age of Onset   Cancer Mother        skin   Heart disease Mother    Vision loss Mother    Early death Father    Arthritis Maternal Grandmother    Cancer Maternal Grandmother    Hyperlipidemia Maternal Grandmother    Hypertension Maternal Grandmother    Cancer Maternal Grandfather    Heart disease Paternal Grandfather    Stroke Paternal Grandfather       Review of Systems  Neurological:  Positive for dizziness.  All other systems reviewed and are negative.      Objective:   Physical Exam Vitals reviewed.  Constitutional:      General: He is not in acute distress.    Appearance: He is well-developed. He is not diaphoretic.  HENT:     Head: Normocephalic and atraumatic.     Right Ear: External ear normal.     Left Ear: External ear normal.     Ears:      Nose: Nose normal.     Mouth/Throat:     Pharynx: No oropharyngeal exudate.  Eyes:     General: No scleral icterus.       Right eye: No discharge.        Left eye: No discharge.     Conjunctiva/sclera: Conjunctivae normal.     Pupils: Pupils are equal, round, and reactive to light.   Neck:     Thyroid: No thyromegaly.     Vascular: No JVD.     Trachea: No tracheal deviation.  Cardiovascular:     Rate and Rhythm: Normal rate and regular rhythm.     Heart sounds: Murmur heard.     No friction rub. No gallop.  Pulmonary:     Effort: Pulmonary effort is normal. No respiratory distress.     Breath sounds: Normal breath sounds. No wheezing or rales.  Chest:     Chest wall: No tenderness.  Abdominal:     General: Bowel sounds are normal. There is no distension.     Palpations:  Abdomen is soft. There is no mass.  Tenderness: There is no abdominal tenderness. There is no guarding or rebound.  Musculoskeletal:     Cervical back: Normal range of motion and neck supple.  Lymphadenopathy:     Cervical: No cervical adenopathy.  Skin:    General: Skin is warm.     Coloration: Skin is not pale.     Findings: No erythema or rash.  Neurological:     Mental Status: He is alert and oriented to person, place, and time.     Cranial Nerves: No cranial nerve deficit.     Motor: No abnormal muscle tone.     Coordination: Coordination normal.     Deep Tendon Reflexes: Reflexes normal.  Psychiatric:        Behavior: Behavior normal.        Thought Content: Thought content normal.        Judgment: Judgment normal.           Assessment & Plan:  Visit for suture removal I removed 3 sutures from his right ear.  I removed 4 sutures from his right eyebrow.  I removed 4 sutures from his right fifth PIP joint.  I reinforced the PIP joint with 3 Steri-Strips and gave the patient a stack aluminum splint to prevent flexion for 1 more week to allow healing.  Discussed rest and gradual return to activity due to his concussion.

## 2023-12-16 ENCOUNTER — Encounter: Payer: Self-pay | Admitting: Family Medicine

## 2023-12-22 ENCOUNTER — Ambulatory Visit: Admitting: Family Medicine

## 2024-04-29 ENCOUNTER — Encounter: Payer: Self-pay | Admitting: Family Medicine

## 2024-04-29 ENCOUNTER — Ambulatory Visit: Admitting: Family Medicine

## 2024-04-29 VITALS — BP 127/83 | HR 70 | Temp 97.6°F | Ht 75.0 in | Wt 206.0 lb

## 2024-04-29 DIAGNOSIS — R052 Subacute cough: Secondary | ICD-10-CM

## 2024-04-29 MED ORDER — PANTOPRAZOLE SODIUM 40 MG PO TBEC: 40.0000 mg | DELAYED_RELEASE_TABLET | Freq: Every day | ORAL | 3 refills | Status: DC

## 2024-04-29 MED ORDER — HYDROCOD POLI-CHLORPHE POLI ER 10-8 MG/5ML PO SUER: 5.0000 mL | Freq: Two times a day (BID) | ORAL | 0 refills | Status: DC | PRN

## 2024-04-29 MED ORDER — PREDNISONE 20 MG PO TABS: ORAL_TABLET | ORAL | 0 refills | Status: DC

## 2024-04-29 NOTE — Progress Notes (Signed)
 Subjective:    Patient ID: Patrick Wall, male    DOB: [birth year], 73 y.o.   MRN: [id number]  The patient has had a cough for over a month.  Cough is nonproductive.  He has noticed black mold in his bathrooms at home.  He took a picture of this to show to me.  He has since treated that in his home.  However he continues to cough.  He denies any wheezing.  He denies any rhinorrhea or head congestion or sinus pain.  He denies any shortness of breath.  He denies any pleurisy.  He does have breakthrough acid reflux almost on a daily basis.  He has been taking Prilosec off and on over the last year.  He denies any hematemesis or abdominal pain.  He denies any night sweats fevers or weight loss.  Past Medical History:  Diagnosis Date   Allergy    Arthritis    Visualized in X-rays   Cataract    GERD (gastroesophageal reflux disease)    tums   Hearing loss    Heart murmur    History of kidney stones    Hyperlipidemia    Labral tear of shoulder    Lumbar spinal stenosis    Past Surgical History:  Procedure Laterality Date   BACK SURGERY  08/19/2020   COLONOSCOPY     EYE SURGERY  2000   lasic   HERNIA REPAIR     MOUTH SURGERY     teeth removed, dental implants placed   SPINE SURGERY  08/2020   L4-L5 FUSION   TONSILLECTOMY     VASECTOMY  1983   Current Outpatient Medications on File Prior to Visit  Medication Sig Dispense Refill   aspirin  EC 81 MG tablet Take 81 mg by mouth daily.     atorvastatin  (LIPITOR) 40 MG tablet TAKE 1 TABLET BY MOUTH DAILY 90 tablet 1   Cyanocobalamin  (B-12) 1000 MCG CAPS Take 1 tablet by mouth daily.     ezetimibe  (ZETIA ) 10 MG tablet Take 1 tablet (10 mg total) by mouth daily. 90 tablet 3   Multiple Vitamin (MULTIVITAMIN) capsule Take 1 capsule by mouth daily.     mupirocin  ointment (BACTROBAN ) 2 % Apply 1 Application topically 2 (two) times daily. 22 g 3   Current Facility-Administered Medications on File Prior to Visit  Medication Dose Route Frequency  Provider Last Rate Last Admin   sodium chloride  flush (NS) 0.9 % injection 10 mL  10 mL Intravenous PRN Nishan, Peter C, MD   20 mL at 01/08/21 1535   Allergies  Allergen Reactions   Sulfa Antibiotics     Unknown reaction   Social History   Socioeconomic History   Marital status: Married    Spouse name: Not on file   Number of children: Not on file   Years of education: Not on file   Highest education level: Bachelor's degree (e.g., BA, AB, BS)  Occupational History   Not on file  Tobacco Use   Smoking status: Never   Smokeless tobacco: Never  Vaping Use   Vaping status: Never Used  Substance and Sexual Activity   Alcohol use: Yes    Alcohol/week: 9.0 standard drinks of alcohol    Types: 5 Glasses of wine, 4 Cans of beer per week   Drug use: No   Sexual activity: Yes    Birth control/protection: Surgical    Comment: vasectomy  Other Topics Concern   Not on file  Social History  Narrative   Not on file   Social Drivers of Health   Financial Resource Strain: Low Risk  (04/28/2024)   Overall Financial Resource Strain (CARDIA)    Difficulty of Paying Living Expenses: Not hard at all  Food Insecurity: No Food Insecurity (04/28/2024)   Hunger Vital Sign    Worried About Running Out of Food in the Last Year: Never true    Ran Out of Food in the Last Year: Never true  Transportation Needs: No Transportation Needs (04/28/2024)   PRAPARE - Administrator, Civil Service (Medical): No    Lack of Transportation (Non-Medical): No  Physical Activity: Sufficiently Active (04/28/2024)   Exercise Vital Sign    Days of Exercise per Week: 7 days    Minutes of Exercise per Session: 40 min  Stress: No Stress Concern Present (04/28/2024)   [employer] of Occupational Health - Occupational Stress Questionnaire    Feeling of Stress: Not at all  Social Connections: Moderately Isolated (04/28/2024)   Social Connection and Isolation Panel    Frequency of Communication with  Friends and Family: Once a week    Frequency of Social Gatherings with Friends and Family: Once a week    Attends Religious Services: Never    Database administrator or Organizations: Yes    Attends Engineer, structural: More than 4 times per year    Marital Status: Married  Catering manager Violence: Not At Risk (06/04/2023)   Humiliation, Afraid, Rape, and Kick questionnaire    Fear of Current or Ex-Partner: No    Emotionally Abused: No    Physically Abused: No    Sexually Abused: No   Family History  Problem Relation Age of Onset   Cancer Mother        skin   Heart disease Mother    Vision loss Mother    Early death Father    Arthritis Maternal Grandmother    Cancer Maternal Grandmother    Hyperlipidemia Maternal Grandmother    Hypertension Maternal Grandmother    Cancer Maternal Grandfather    Heart disease Paternal Grandfather    Stroke Paternal Grandfather       Review of Systems  All other systems reviewed and are negative.      Objective:   Physical Exam Vitals reviewed.  Constitutional:      General: He is not in acute distress.    Appearance: He is well-developed. He is not diaphoretic.  HENT:     Head: Normocephalic and atraumatic.     Right Ear: External ear normal.     Left Ear: External ear normal.     Nose: Nose normal.     Mouth/Throat:     Pharynx: No oropharyngeal exudate.  Eyes:     General: No scleral icterus.       Right eye: No discharge.        Left eye: No discharge.     Conjunctiva/sclera: Conjunctivae normal.     Pupils: Pupils are equal, round, and reactive to light.  Neck:     Thyroid: No thyromegaly.     Vascular: No JVD.     Trachea: No tracheal deviation.  Cardiovascular:     Rate and Rhythm: Normal rate and regular rhythm.     Heart sounds: Murmur heard.     No friction rub. No gallop.  Pulmonary:     Effort: Pulmonary effort is normal. No respiratory distress.     Breath sounds: Normal breath sounds.  No wheezing  or rales.  Chest:     Chest wall: No tenderness.  Abdominal:     General: Bowel sounds are normal. There is no distension.     Palpations: Abdomen is soft. There is no mass.     Tenderness: There is no abdominal tenderness. There is no guarding or rebound.  Musculoskeletal:     Cervical back: Normal range of motion and neck supple.  Lymphadenopathy:     Cervical: No cervical adenopathy.  Skin:    General: Skin is warm.     Coloration: Skin is not pale.     Findings: No erythema or rash.  Neurological:     Mental Status: He is alert and oriented to person, place, and time.     Cranial Nerves: No cranial nerve deficit.     Motor: No abnormal muscle tone.     Coordination: Coordination normal.     Deep Tendon Reflexes: Reflexes normal.  Psychiatric:        Behavior: Behavior normal.        Thought Content: Thought content normal.        Judgment: Judgment normal.           Assessment & Plan:  Subacute cough I think this is an irritant cough due to either to possible cough variant asthma triggered by the mold or acid reflux.  Recommended trying a combination of prednisone  for possible cough variant asthma coupled with Protonix  40 mg daily for acid reflux and elevate the head of the bed.  He can use Tussionex as needed for cough.  Recheck here in 2 weeks

## 2024-05-03 ENCOUNTER — Other Ambulatory Visit: Payer: BC Managed Care – PPO

## 2024-05-03 DIAGNOSIS — Z0000 Encounter for general adult medical examination without abnormal findings: Secondary | ICD-10-CM

## 2024-05-03 DIAGNOSIS — E785 Hyperlipidemia, unspecified: Secondary | ICD-10-CM | POA: Diagnosis not present

## 2024-05-04 LAB — CBC WITH DIFFERENTIAL/PLATELET
Absolute Lymphocytes: 2564 {cells}/uL (ref 850–3900)
Absolute Monocytes: 545 {cells}/uL (ref 200–950)
Basophils Absolute: 17 {cells}/uL (ref 0–200)
Basophils Relative: 0.3 %
Eosinophils Absolute: 12 {cells}/uL — ABNORMAL LOW (ref 15–500)
Eosinophils Relative: 0.2 %
HCT: 43.4 % (ref 38.5–50.0)
Hemoglobin: 14.3 g/dL (ref 13.2–17.1)
MCH: 31.7 pg (ref 27.0–33.0)
MCHC: 32.9 g/dL (ref 32.0–36.0)
MCV: 96.2 fL (ref 80.0–100.0)
MPV: 10.9 fL (ref 7.5–12.5)
Monocytes Relative: 9.4 %
Neutro Abs: 2662 {cells}/uL (ref 1500–7800)
Neutrophils Relative %: 45.9 %
Platelets: 160 Thousand/uL (ref 140–400)
RBC: 4.51 Million/uL (ref 4.20–5.80)
RDW: 13.2 % (ref 11.0–15.0)
Total Lymphocyte: 44.2 %
WBC: 5.8 Thousand/uL (ref 3.8–10.8)

## 2024-05-04 LAB — COMPLETE METABOLIC PANEL WITHOUT GFR
AG Ratio: 2.3 (calc) (ref 1.0–2.5)
ALT: 28 U/L (ref 9–46)
AST: 26 U/L (ref 10–35)
Albumin: 4.4 g/dL (ref 3.6–5.1)
Alkaline phosphatase (APISO): 35 U/L (ref 35–144)
BUN: 19 mg/dL (ref 7–25)
CO2: 29 mmol/L (ref 20–32)
Calcium: 9.7 mg/dL (ref 8.6–10.3)
Chloride: 104 mmol/L (ref 98–110)
Creat: 1.08 mg/dL (ref 0.70–1.28)
Globulin: 1.9 g/dL (ref 1.9–3.7)
Glucose, Bld: 86 mg/dL (ref 65–99)
Potassium: 4.2 mmol/L (ref 3.5–5.3)
Sodium: 141 mmol/L (ref 135–146)
Total Bilirubin: 1 mg/dL (ref 0.2–1.2)
Total Protein: 6.3 g/dL (ref 6.1–8.1)

## 2024-05-04 LAB — LIPID PANEL
Cholesterol: 173 mg/dL (ref <200)
HDL: 97 mg/dL (ref >=40)
LDL Cholesterol (Calc): 63 mg/dL
Non-HDL Cholesterol (Calc): 76 mg/dL (ref <130)
Total CHOL/HDL Ratio: 1.8 (calc) (ref <5.0)
Triglycerides: 47 mg/dL (ref <150)

## 2024-05-04 LAB — PSA: PSA: 0.62 ng/mL (ref <=4.00)

## 2024-05-04 LAB — TSH: TSH: 4.11 m[IU]/L (ref 0.40–4.50)

## 2024-05-05 ENCOUNTER — Ambulatory Visit: Payer: Self-pay | Admitting: Family Medicine

## 2024-05-09 ENCOUNTER — Ambulatory Visit (INDEPENDENT_AMBULATORY_CARE_PROVIDER_SITE_OTHER): Payer: BC Managed Care – PPO | Admitting: Family Medicine

## 2024-05-09 ENCOUNTER — Encounter: Payer: Self-pay | Admitting: Family Medicine

## 2024-05-09 VITALS — BP 120/68 | HR 79 | Ht 75.0 in | Wt 203.8 lb

## 2024-05-09 DIAGNOSIS — L989 Disorder of the skin and subcutaneous tissue, unspecified: Secondary | ICD-10-CM | POA: Diagnosis not present

## 2024-05-09 DIAGNOSIS — I350 Nonrheumatic aortic (valve) stenosis: Secondary | ICD-10-CM | POA: Diagnosis not present

## 2024-05-09 DIAGNOSIS — D485 Neoplasm of uncertain behavior of skin: Secondary | ICD-10-CM | POA: Diagnosis not present

## 2024-05-09 DIAGNOSIS — Z0000 Encounter for general adult medical examination without abnormal findings: Secondary | ICD-10-CM

## 2024-05-09 DIAGNOSIS — Z23 Encounter for immunization: Secondary | ICD-10-CM | POA: Diagnosis not present

## 2024-05-09 DIAGNOSIS — Z0001 Encounter for general adult medical examination with abnormal findings: Secondary | ICD-10-CM | POA: Diagnosis not present

## 2024-05-09 DIAGNOSIS — R931 Abnormal findings on diagnostic imaging of heart and coronary circulation: Secondary | ICD-10-CM

## 2024-05-09 NOTE — Progress Notes (Signed)
 Subjective:    Patient ID: Patrick Wall, male    DOB: 05/04/1951, 73 y.o.   MRN: [id number]  HPI Patient is a very pleasant 73 year old Caucasian gentleman here today for complete physical exam.  Most recent lab work is listed below.  Cologuard was performed in 2023 and is due again in 2026.  His PSA is excellent.  His cholesterol is outstanding.  However he stays on a statin due to an elevated coronary artery calcium  score.  He also has a history of moderate aortic stenosis.  He is seeing cardiology annually to monitor this.  Otherwise he is doing well.  His cough has improved after the addition of Protonix .  Patient is due today for a flu shot, he is also due for RSV and COVID.  He defers RSV at the present time.  He will get a COVID-vaccine at his pharmacy.  He has a nonhealing lesion on the lateral aspect of his left thigh just proximal and lateral to the knee.  It is 1 cm in diameter.  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  It has been there since May. Lab on 05/03/2024  Component Date Value Ref Range Status   WBC 05/03/2024 5.8  3.8 - 10.8 Thousand/uL Final   RBC 05/03/2024 4.51  4.20 - 5.80 Million/uL Final   Hemoglobin 05/03/2024 14.3  13.2 - 17.1 g/dL Final   HCT 90/97/7974 43.4  38.5 - 50.0 % Final   MCV 05/03/2024 96.2  80.0 - 100.0 fL Final   MCH 05/03/2024 31.7  27.0 - 33.0 pg Final   MCHC 05/03/2024 32.9  32.0 - 36.0 g/dL Final   Comment: For adults, a slight decrease in the calculated MCHC value (in the range of 30 to 32 g/dL) is most likely not clinically significant; however, it should be interpreted with caution in correlation with other red cell parameters and the patient's clinical condition.    RDW 05/03/2024 13.2  11.0 - 15.0 % Final   Platelets 05/03/2024 160  140 - 400 Thousand/uL Final   MPV 05/03/2024 10.9  7.5 - 12.5 fL Final   Neutro Abs 05/03/2024 2,662  1,500 - 7,800 cells/uL Final   Absolute Lymphocytes 05/03/2024 2,564  850 - 3,900 cells/uL Final   Absolute Monocytes  05/03/2024 545  200 - 950 cells/uL Final   Eosinophils Absolute 05/03/2024 12 (L)  15 - 500 cells/uL Final   Basophils Absolute 05/03/2024 17  0 - 200 cells/uL Final   Neutrophils Relative % 05/03/2024 45.9  % Final   Total Lymphocyte 05/03/2024 44.2  % Final   Monocytes Relative 05/03/2024 9.4  % Final   Eosinophils Relative 05/03/2024 0.2  % Final   Basophils Relative 05/03/2024 0.3  % Final   Glucose, Bld 05/03/2024 86  65 - 99 mg/dL Final   Comment: .            Fasting reference interval .    BUN 05/03/2024 19  7 - 25 mg/dL Final   Creat 90/97/7974 1.08  0.70 - 1.28 mg/dL Final   BUN/Creatinine Ratio 05/03/2024 SEE NOTE:  6 - 22 (calc) Final   Comment:    Not Reported: BUN and Creatinine are within    reference range. .    Sodium 05/03/2024 141  135 - 146 mmol/L Final   Potassium 05/03/2024 4.2  3.5 - 5.3 mmol/L Final   Chloride 05/03/2024 104  98 - 110 mmol/L Final   CO2 05/03/2024 29  20 - 32 mmol/L Final  Calcium  05/03/2024 9.7  8.6 - 10.3 mg/dL Final   Total Protein 90/97/7974 6.3  6.1 - 8.1 g/dL Final   Albumin 90/97/7974 4.4  3.6 - 5.1 g/dL Final   Globulin 90/97/7974 1.9  1.9 - 3.7 g/dL (calc) Final   AG Ratio 05/03/2024 2.3  1.0 - 2.5 (calc) Final   Total Bilirubin 05/03/2024 1.0  0.2 - 1.2 mg/dL Final   Alkaline phosphatase (APISO) 05/03/2024 35  35 - 144 U/L Final   AST 05/03/2024 26  10 - 35 U/L Final   ALT 05/03/2024 28  9 - 46 U/L Final   Cholesterol 05/03/2024 173  <200 mg/dL Final   HDL 90/97/7974 97  > OR = 40 mg/dL Final   Triglycerides 90/97/7974 47  <150 mg/dL Final   LDL Cholesterol (Calc) 05/03/2024 63  mg/dL (calc) Final   Comment: Reference range: <100 . Desirable range <100 mg/dL for primary prevention;   <70 mg/dL for patients with CHD or diabetic patients  with > or = 2 CHD risk factors. [name] is now calculated using the Martin-Hopkins  calculation, which is a validated novel method providing  better accuracy than the Friedewald equation  in the  estimation of LDL-C.  [name]. 7986;689(80): 2061-2068  (http://education.[facility].com/faq/FAQ164)    Total CHOL/HDL Ratio 05/03/2024 1.8  <4.9 (calc) Final   Non-HDL Cholesterol (Calc) 05/03/2024 76  <130 mg/dL (calc) Final   Comment: For patients with diabetes plus 1 major ASCVD risk  factor, treating to a non-HDL-C goal of <100 mg/dL  (LDL-C of <29 mg/dL) is considered a therapeutic  option.    TSH 05/03/2024 4.11  0.40 - 4.50 mIU/L Final   PSA 05/03/2024 0.62  < OR = 4.00 ng/mL Final   Comment: The total PSA value from this assay system is  standardized against the WHO standard. The test  result will be approximately 20% lower when compared  to the equimolar-standardized total PSA (Beckman  Coulter). Comparison of serial PSA results should be  interpreted with this fact in mind. . This test was performed using the Siemens  chemiluminescent method. Values obtained from  different assay methods cannot be used interchangeably. PSA levels, regardless of value, should not be interpreted as absolute evidence of the presence or absence of disease.     Immunization History  Administered Date(s) Administered   Fluad Quad(high Dose 65+) 07/03/2020   Influenza Split 05/21/2015   Influenza, Seasonal, Injecte, Preservative Fre 05/08/2023   Influenza,inj,Quad PF,6+ Mos 05/02/2021   Influenza-Unspecified 06/02/2016, 05/24/2018, 05/27/2019, 05/06/2022   PFIZER Comirnaty(Gray Top)Covid-19 Tri-Sucrose Vaccine 01/15/2021   PFIZER(Purple Top)SARS-COV-2 Vaccination 09/03/2019, 09/23/2019, 05/27/2020   Pfizer(Comirnaty)Fall Seasonal Vaccine 12 years and older 05/06/2022, 06/08/2023   Pneumococcal Conjugate-13 07/09/2017   Pneumococcal Polysaccharide-23 09/19/2014, 04/30/2020   Tdap 04/01/2014, 12/08/2023   Zoster Recombinant(Shingrix ) 05/08/2023, 07/20/2023   Zoster, Live 03/07/2014    Lab on 05/03/2024  Component Date Value Ref Range Status   WBC  05/03/2024 5.8  3.8 - 10.8 Thousand/uL Final   RBC 05/03/2024 4.51  4.20 - 5.80 Million/uL Final   Hemoglobin 05/03/2024 14.3  13.2 - 17.1 g/dL Final   HCT 90/97/7974 43.4  38.5 - 50.0 % Final   MCV 05/03/2024 96.2  80.0 - 100.0 fL Final   MCH 05/03/2024 31.7  27.0 - 33.0 pg Final   MCHC 05/03/2024 32.9  32.0 - 36.0 g/dL Final   Comment: For adults, a slight decrease in the calculated MCHC value (in the range of 30 to  32 g/dL) is most likely not clinically significant; however, it should be interpreted with caution in correlation with other red cell parameters and the patient's clinical condition.    RDW 05/03/2024 13.2  11.0 - 15.0 % Final   Platelets 05/03/2024 160  140 - 400 Thousand/uL Final   MPV 05/03/2024 10.9  7.5 - 12.5 fL Final   Neutro Abs 05/03/2024 2,662  1,500 - 7,800 cells/uL Final   Absolute Lymphocytes 05/03/2024 2,564  850 - 3,900 cells/uL Final   Absolute Monocytes 05/03/2024 545  200 - 950 cells/uL Final   Eosinophils Absolute 05/03/2024 12 (L)  15 - 500 cells/uL Final   Basophils Absolute 05/03/2024 17  0 - 200 cells/uL Final   Neutrophils Relative % 05/03/2024 45.9  % Final   Total Lymphocyte 05/03/2024 44.2  % Final   Monocytes Relative 05/03/2024 9.4  % Final   Eosinophils Relative 05/03/2024 0.2  % Final   Basophils Relative 05/03/2024 0.3  % Final   Glucose, Bld 05/03/2024 86  65 - 99 mg/dL Final   Comment: .            Fasting reference interval .    BUN 05/03/2024 19  7 - 25 mg/dL Final   Creat 90/97/7974 1.08  0.70 - 1.28 mg/dL Final   BUN/Creatinine Ratio 05/03/2024 SEE NOTE:  6 - 22 (calc) Final   Comment:    Not Reported: BUN and Creatinine are within    reference range. .    Sodium 05/03/2024 141  135 - 146 mmol/L Final   Potassium 05/03/2024 4.2  3.5 - 5.3 mmol/L Final   Chloride 05/03/2024 104  98 - 110 mmol/L Final   CO2 05/03/2024 29  20 - 32 mmol/L Final   Calcium  05/03/2024 9.7  8.6 - 10.3 mg/dL Final   Total Protein 90/97/7974 6.3   6.1 - 8.1 g/dL Final   Albumin 90/97/7974 4.4  3.6 - 5.1 g/dL Final   Globulin 90/97/7974 1.9  1.9 - 3.7 g/dL (calc) Final   AG Ratio 05/03/2024 2.3  1.0 - 2.5 (calc) Final   Total Bilirubin 05/03/2024 1.0  0.2 - 1.2 mg/dL Final   Alkaline phosphatase (APISO) 05/03/2024 35  35 - 144 U/L Final   AST 05/03/2024 26  10 - 35 U/L Final   ALT 05/03/2024 28  9 - 46 U/L Final   Cholesterol 05/03/2024 173  <200 mg/dL Final   HDL 90/97/7974 97  > OR = 40 mg/dL Final   Triglycerides 90/97/7974 47  <150 mg/dL Final   LDL Cholesterol (Calc) 05/03/2024 63  mg/dL (calc) Final   Comment: Reference range: <100 . Desirable range <100 mg/dL for primary prevention;   <70 mg/dL for patients with CHD or diabetic patients  with > or = 2 CHD risk factors. [name] is now calculated using the Martin-Hopkins  calculation, which is a validated novel method providing  better accuracy than the Friedewald equation in the  estimation of LDL-C.  [name]. 7986;689(80): 2061-2068  (http://education.[facility].com/faq/FAQ164)    Total CHOL/HDL Ratio 05/03/2024 1.8  <4.9 (calc) Final   Non-HDL Cholesterol (Calc) 05/03/2024 76  <130 mg/dL (calc) Final   Comment: For patients with diabetes plus 1 major ASCVD risk  factor, treating to a non-HDL-C goal of <100 mg/dL  (LDL-C of <29 mg/dL) is considered a therapeutic  option.    TSH 05/03/2024 4.11  0.40 - 4.50 mIU/L Final   PSA 05/03/2024 0.62  < OR = 4.00 ng/mL  Final   Comment: The total PSA value from this assay system is  standardized against the WHO standard. The test  result will be approximately 20% lower when compared  to the equimolar-standardized total PSA (Beckman  Coulter). Comparison of serial PSA results should be  interpreted with this fact in mind. . This test was performed using the Siemens  chemiluminescent method. Values obtained from  different assay methods cannot be used interchangeably. PSA levels, regardless of value,  should not be interpreted as absolute evidence of the presence or absence of disease.     Past Medical History:  Diagnosis Date   Allergy    Arthritis    Visualized in X-rays   Cataract    GERD (gastroesophageal reflux disease)    tums   Hearing loss    Heart murmur    History of kidney stones    Hyperlipidemia    Labral tear of shoulder    Lumbar spinal stenosis    Past Surgical History:  Procedure Laterality Date   BACK SURGERY  08/19/2020   COLONOSCOPY     EYE SURGERY  2000   lasic   HERNIA REPAIR     MOUTH SURGERY     teeth removed, dental implants placed   SPINE SURGERY  08/2020   L4-L5 FUSION   TONSILLECTOMY     VASECTOMY  1983   Current Outpatient Medications on File Prior to Visit  Medication Sig Dispense Refill   aspirin  EC 81 MG tablet Take 81 mg by mouth daily.     atorvastatin  (LIPITOR) 40 MG tablet TAKE 1 TABLET BY MOUTH DAILY 90 tablet 1   Cyanocobalamin  (B-12) 1000 MCG CAPS Take 1 tablet by mouth daily.     ezetimibe  (ZETIA ) 10 MG tablet Take 1 tablet (10 mg total) by mouth daily. 90 tablet 3   Multiple Vitamin (MULTIVITAMIN) capsule Take 1 capsule by mouth daily.     mupirocin  ointment (BACTROBAN ) 2 % Apply 1 Application topically 2 (two) times daily. 22 g 3   pantoprazole  (PROTONIX ) 40 MG tablet Take 1 tablet (40 mg total) by mouth daily. 30 tablet 3   chlorpheniramine-HYDROcodone  (TUSSIONEX) 10-8 MG/5ML Take 5 mLs by mouth every 12 (twelve) hours as needed. (Patient not taking: Reported on 05/09/2024) 120 mL 0   predniSONE  (DELTASONE ) 20 MG tablet 3 tabs poqday 1-2, 2 tabs poqday 3-4, 1 tab poqday 5-6 (Patient not taking: Reported on 05/09/2024) 12 tablet 0   Current Facility-Administered Medications on File Prior to Visit  Medication Dose Route Frequency Provider Last Rate Last Admin   sodium chloride  flush (NS) 0.9 % injection 10 mL  10 mL Intravenous PRN Nishan, Peter C, MD   20 mL at 01/08/21 1535   Allergies  Allergen Reactions   Sulfa  Antibiotics     Unknown reaction   Social History   Socioeconomic History   Marital status: Married    Spouse name: Not on file   Number of children: Not on file   Years of education: Not on file   Highest education level: Bachelor's degree (e.g., BA, AB, BS)  Occupational History   Not on file  Tobacco Use   Smoking status: Never   Smokeless tobacco: Never  Vaping Use   Vaping status: Never Used  Substance and Sexual Activity   Alcohol use: Yes    Alcohol/week: 9.0 standard drinks of alcohol    Types: 5 Glasses of wine, 4 Cans of beer per week   Drug use: No  Sexual activity: Yes    Birth control/protection: Surgical    Comment: vasectomy  Other Topics Concern   Not on file  Social History Narrative   Not on file   Social Drivers of Health   Financial Resource Strain: Low Risk  (04/28/2024)   Overall Financial Resource Strain (CARDIA)    Difficulty of Paying Living Expenses: Not hard at all  Food Insecurity: No Food Insecurity (04/28/2024)   Hunger Vital Sign    Worried About Running Out of Food in the Last Year: Never true    Ran Out of Food in the Last Year: Never true  Transportation Needs: No Transportation Needs (04/28/2024)   PRAPARE - Administrator, Civil Service (Medical): No    Lack of Transportation (Non-Medical): No  Physical Activity: Sufficiently Active (04/28/2024)   Exercise Vital Sign    Days of Exercise per Week: 7 days    Minutes of Exercise per Session: 40 min  Stress: No Stress Concern Present (04/28/2024)   [employer] of Occupational Health - Occupational Stress Questionnaire    Feeling of Stress: Not at all  Social Connections: Moderately Isolated (04/28/2024)   Social Connection and Isolation Panel    Frequency of Communication with Friends and Family: Once a week    Frequency of Social Gatherings with Friends and Family: Once a week    Attends Religious Services: Never    Database administrator or Organizations: Yes     Attends Engineer, structural: More than 4 times per year    Marital Status: Married  Catering manager Violence: Not At Risk (06/04/2023)   Humiliation, Afraid, Rape, and Kick questionnaire    Fear of Current or Ex-Partner: No    Emotionally Abused: No    Physically Abused: No    Sexually Abused: No   Family History  Problem Relation Age of Onset   Cancer Mother        skin   Heart disease Mother    Vision loss Mother    Early death Father    Arthritis Maternal Grandmother    Cancer Maternal Grandmother    Hyperlipidemia Maternal Grandmother    Hypertension Maternal Grandmother    Cancer Maternal Grandfather    Heart disease Paternal Grandfather    Stroke Paternal Grandfather       Review of Systems  All other systems reviewed and are negative.      Objective:   Physical Exam Vitals reviewed.  Constitutional:      General: He is not in acute distress.    Appearance: He is well-developed. He is not diaphoretic.  HENT:     Head: Normocephalic and atraumatic.     Right Ear: External ear normal.     Left Ear: External ear normal.     Nose: Nose normal.     Mouth/Throat:     Pharynx: No oropharyngeal exudate.  Eyes:     General: No scleral icterus.       Right eye: No discharge.        Left eye: No discharge.     Conjunctiva/sclera: Conjunctivae normal.     Pupils: Pupils are equal, round, and reactive to light.  Neck:     Thyroid: No thyromegaly.     Vascular: Carotid bruit present. No JVD.     Trachea: No tracheal deviation.  Cardiovascular:     Rate and Rhythm: Normal rate and regular rhythm.     Heart sounds: Murmur heard.  No friction rub. No gallop.  Pulmonary:     Effort: Pulmonary effort is normal. No respiratory distress.     Breath sounds: Normal breath sounds. No wheezing or rales.  Chest:     Chest wall: No tenderness.  Abdominal:     General: Bowel sounds are normal. There is no distension.     Palpations: Abdomen is soft. There is  no mass.     Tenderness: There is no abdominal tenderness. There is no guarding or rebound.  Musculoskeletal:     Cervical back: Normal range of motion and neck supple.  Lymphadenopathy:     Cervical: No cervical adenopathy.  Skin:    General: Skin is warm.     Coloration: Skin is not pale.     Findings: No erythema or rash.  Neurological:     Mental Status: He is alert and oriented to person, place, and time.     Cranial Nerves: No cranial nerve deficit.     Motor: No abnormal muscle tone.     Coordination: Coordination normal.     Deep Tendon Reflexes: Reflexes normal.  Psychiatric:        Behavior: Behavior normal.        Thought Content: Thought content normal.        Judgment: Judgment normal.    1 cm nonhealing ulcerative lesion proximal and lateral to the left kneecap.       Assessment & Plan:  Skin lesion of left leg - Plan: Pathology Report ([facility])  Elevated coronary artery calcium  score  Aortic valve stenosis, etiology of cardiac valve disease unspecified  General medical exam Physical exam and is excellent.  Patient received his flu shot today.  We discussed RSV and COVID.  Cologuard is due next year.  PSA is outstanding.  Cholesterol is excellent but I would continue his medication due to his elevated coronary artery calcium  score.  Cough has improved.  Recommended continuing Protonix  for 2 to 3 months and then stopping the medication to see if he still needs to take it.  Lesion on the lateral aspect of the left thigh could be a skin malignancy.  Patient would like to proceed with a shave biopsy.  Lesion was anesthetized with 0.1% lidocaine  with epinephrine.  A shave biopsy was performed using sterile technique and the lesion was sent to pathology in a labeled container.  Hemostasis was achieved with Drysol and a Band-Aid

## 2024-05-09 NOTE — Addendum Note (Signed)
 Addended by: ANGELENA RONAL BRADLEY K on: 05/09/2024 04:29 PM   Modules accepted: Orders

## 2024-05-11 ENCOUNTER — Encounter: Payer: Self-pay | Admitting: Family Medicine

## 2024-05-11 ENCOUNTER — Other Ambulatory Visit: Payer: Self-pay

## 2024-05-11 DIAGNOSIS — Z23 Encounter for immunization: Secondary | ICD-10-CM

## 2024-05-11 MED ORDER — COVID-19 MRNA VACC (MODERNA) 50 MCG/0.5ML IM SUSY: 0.5000 mL | PREFILLED_SYRINGE | Freq: Once | INTRAMUSCULAR | 0 refills | Status: DC

## 2024-05-12 MED ORDER — COVID-19 MRNA VAC-TRIS(PFIZER) 30 MCG/0.3ML IM SUSY: 0.3000 mL | PREFILLED_SYRINGE | Freq: Once | INTRAMUSCULAR | 0 refills | Status: AC

## 2024-05-12 NOTE — Addendum Note (Signed)
 Addended by: ERIKA ELIDA RAMAN on: 05/12/2024 08:41 AM   Modules accepted: Orders

## 2024-05-13 LAB — TISSUE SPECIMEN

## 2024-05-13 LAB — PATHOLOGY REPORT

## 2024-05-16 ENCOUNTER — Ambulatory Visit: Payer: Self-pay | Admitting: Family Medicine

## 2024-05-22 ENCOUNTER — Other Ambulatory Visit: Payer: Self-pay | Admitting: Physician Assistant

## 2024-05-22 ENCOUNTER — Other Ambulatory Visit: Payer: Self-pay | Admitting: Family Medicine

## 2024-05-23 ENCOUNTER — Ambulatory Visit: Admitting: Family Medicine

## 2024-05-24 ENCOUNTER — Encounter: Payer: Self-pay | Admitting: Family Medicine

## 2024-05-24 ENCOUNTER — Ambulatory Visit (INDEPENDENT_AMBULATORY_CARE_PROVIDER_SITE_OTHER): Admitting: Family Medicine

## 2024-05-24 VITALS — BP 138/72 | HR 71 | Temp 97.8°F | Ht 75.0 in | Wt 201.0 lb

## 2024-05-24 DIAGNOSIS — L739 Follicular disorder, unspecified: Secondary | ICD-10-CM | POA: Diagnosis not present

## 2024-05-24 MED ORDER — DOXYCYCLINE HYCLATE 100 MG PO TABS: 100.0000 mg | ORAL_TABLET | Freq: Two times a day (BID) | ORAL | 0 refills | Status: DC

## 2024-05-24 NOTE — Progress Notes (Signed)
 Subjective:    Patient ID: Patrick Wall, male    DOB: [birth year], 73 y.o.   MRN: [id number]  HPI I recently performed a biopsy on the patient's left leg just superior and lateral to his left knee.  The pain had a nonhealing erythematous papule with a scab that had turned into an ulcer.  Shave biopsy reveals scar tissue and fibrous tissue as shown below.  Office Visit on 05/09/2024  Component Date Value Ref Range Status   Relevant History: 05/09/2024    Final   ICD-10: L98.9   Pathologist: 05/09/2024    Final   Comment: Patrick Wall, M.D. Board Certified in Anatomic Pathology and Dermatopathology (electronic signature) Pathologist Release Date/Time: 05/13/2024 09:48AM    A Source 05/09/2024    Final   Skin, left outer thigh, biopsy:   A Gross Description 05/09/2024    Final   Comment: Specimen received in 10% neutral buffered  formalin, labeled with the patient's name, DOB  and L outer thigh and consists of a 1.3 x 0.9 x  0.1 cm shave biopsy. The skin surface displays a  0.5 x 0.5 cm pink-tan lesion. The specimen is  inked green, serially sectioned and entirely  submitted in 2 cassette(s), labeled A1-A2.  The  tips are placed in A1.    A Diagnosis 05/09/2024    Final   Comment: Stasis changes with features of scar and repair . Comment: There is a superficial perivascular  infiltrate with increased vascularity and  scattered siderophages.  Early granulation tissue  is present.  . After review of the initial H&E slides,  immunohistochemical stains which are deemed  necessary for diagnostic purposes were performed  in Block-A2, with the following results: S100(-  in spindle cells), CD68(+ histiocytes) and P63(-  in spindle cells).  Appropriate controls were  used.   Lab on 05/03/2024  Component Date Value Ref Range Status   WBC 05/03/2024 5.8  3.8 - 10.8 Thousand/uL Final   RBC 05/03/2024 4.51  4.20 - 5.80 Million/uL Final   Hemoglobin 05/03/2024 14.3  13.2 - 17.1  g/dL Final   HCT 90/97/7974 43.4  38.5 - 50.0 % Final   MCV 05/03/2024 96.2  80.0 - 100.0 fL Final   MCH 05/03/2024 31.7  27.0 - 33.0 pg Final   MCHC 05/03/2024 32.9  32.0 - 36.0 g/dL Final   Comment: For adults, a slight decrease in the calculated MCHC value (in the range of 30 to 32 g/dL) is most likely not clinically significant; however, it should be interpreted with caution in correlation with other red cell parameters and the patient's clinical condition.    RDW 05/03/2024 13.2  11.0 - 15.0 % Final   Platelets 05/03/2024 160  140 - 400 Thousand/uL Final   MPV 05/03/2024 10.9  7.5 - 12.5 fL Final   Neutro Abs 05/03/2024 2,662  1,500 - 7,800 cells/uL Final   Absolute Lymphocytes 05/03/2024 2,564  850 - 3,900 cells/uL Final   Absolute Monocytes 05/03/2024 545  200 - 950 cells/uL Final   Eosinophils Absolute 05/03/2024 12 (L)  15 - 500 cells/uL Final   Basophils Absolute 05/03/2024 17  0 - 200 cells/uL Final   Neutrophils Relative % 05/03/2024 45.9  % Final   Total Lymphocyte 05/03/2024 44.2  % Final   Monocytes Relative 05/03/2024 9.4  % Final   Eosinophils Relative 05/03/2024 0.2  % Final   Basophils Relative 05/03/2024 0.3  % Final   Glucose, Bld 05/03/2024 86  65 -  99 mg/dL Final   Comment: .            Fasting reference interval .    BUN 05/03/2024 19  7 - 25 mg/dL Final   Creat 90/97/7974 1.08  0.70 - 1.28 mg/dL Final   BUN/Creatinine Ratio 05/03/2024 SEE NOTE:  6 - 22 (calc) Final   Comment:    Not Reported: BUN and Creatinine are within    reference range. .    Sodium 05/03/2024 141  135 - 146 mmol/L Final   Potassium 05/03/2024 4.2  3.5 - 5.3 mmol/L Final   Chloride 05/03/2024 104  98 - 110 mmol/L Final   CO2 05/03/2024 29  20 - 32 mmol/L Final   Calcium  05/03/2024 9.7  8.6 - 10.3 mg/dL Final   Total Protein 90/97/7974 6.3  6.1 - 8.1 g/dL Final   Albumin 90/97/7974 4.4  3.6 - 5.1 g/dL Final   Globulin 90/97/7974 1.9  1.9 - 3.7 g/dL (calc) Final   AG Ratio  05/03/2024 2.3  1.0 - 2.5 (calc) Final   Total Bilirubin 05/03/2024 1.0  0.2 - 1.2 mg/dL Final   Alkaline phosphatase (APISO) 05/03/2024 35  35 - 144 U/L Final   AST 05/03/2024 26  10 - 35 U/L Final   ALT 05/03/2024 28  9 - 46 U/L Final   Cholesterol 05/03/2024 173  <200 mg/dL Final   HDL 90/97/7974 97  > OR = 40 mg/dL Final   Triglycerides 90/97/7974 47  <150 mg/dL Final   LDL Cholesterol (Calc) 05/03/2024 63  mg/dL (calc) Final   Comment: Reference range: <100 . Desirable range <100 mg/dL for primary prevention;   <70 mg/dL for patients with CHD or diabetic patients  with > or = 2 CHD risk factors. [name] is now calculated using the Martin-Hopkins  calculation, which is a validated novel method providing  better accuracy than the Friedewald equation in the  estimation of LDL-C.  Patrick Wall et al. Patrick Wall. 7986;689(80): 2061-2068  (http://education.[facility].com/faq/FAQ164)    Total CHOL/HDL Ratio 05/03/2024 1.8  <4.9 (calc) Final   Non-HDL Cholesterol (Calc) 05/03/2024 76  <130 mg/dL (calc) Final   Comment: For patients with diabetes plus 1 major ASCVD risk  factor, treating to a non-HDL-C goal of <100 mg/dL  (LDL-C of <29 mg/dL) is considered a therapeutic  option.    TSH 05/03/2024 4.11  0.40 - 4.50 mIU/L Final   PSA 05/03/2024 0.62  < OR = 4.00 ng/mL Final   Comment: The total PSA value from this assay system is  standardized against the WHO standard. The test  result will be approximately 20% lower when compared  to the equimolar-standardized total PSA (Beckman  Coulter). Comparison of serial PSA results should be  interpreted with this fact in mind. . This test was performed using the Siemens  chemiluminescent method. Values obtained from  different assay methods cannot be used interchangeably. PSA levels, regardless of value, should not be interpreted as absolute evidence of the presence or absence of disease.     Immunization History  Administered Date(s)  Administered   Fluad Quad(high Dose 65+) 07/03/2020   INFLUENZA, HIGH DOSE SEASONAL PF 05/09/2024   Influenza Split 05/21/2015   Influenza, Seasonal, Injecte, Preservative Fre 05/08/2023   Influenza,inj,Quad PF,6+ Mos 05/02/2021   Influenza-Unspecified 06/02/2016, 05/24/2018, 05/27/2019, 05/06/2022   PFIZER Comirnaty(Gray Top)Covid-19 Tri-Sucrose Vaccine 01/15/2021   PFIZER(Purple Top)SARS-COV-2 Vaccination 09/03/2019, 09/23/2019, 05/27/2020   Pfizer(Comirnaty)Fall Seasonal Vaccine 12 years and older 05/06/2022, 06/08/2023   Pneumococcal Conjugate-13  07/09/2017   Pneumococcal Polysaccharide-23 09/19/2014, 04/30/2020   Tdap 04/01/2014, 12/08/2023   Zoster Recombinant(Shingrix ) 05/08/2023, 07/20/2023   Zoster, Live 03/07/2014    No visits with results within 1 Week(s) from this visit.  Latest known visit with results is:  Office Visit on 05/09/2024  Component Date Value Ref Range Status   Relevant History: 05/09/2024    Final   ICD-10: L98.9   Pathologist: 05/09/2024    Final   Comment: Patrick Wall, M.D. Board Certified in Anatomic Pathology and Dermatopathology (electronic signature) Pathologist Release Date/Time: 05/13/2024 09:48AM    A Source 05/09/2024    Final   Skin, left outer thigh, biopsy:   A Gross Description 05/09/2024    Final   Comment: Specimen received in 10% neutral buffered  formalin, labeled with the patient's name, DOB  and L outer thigh and consists of a 1.3 x 0.9 x  0.1 cm shave biopsy. The skin surface displays a  0.5 x 0.5 cm pink-tan lesion. The specimen is  inked green, serially sectioned and entirely  submitted in 2 cassette(s), labeled A1-A2.  The  tips are placed in A1.    A Diagnosis 05/09/2024    Final   Comment: Stasis changes with features of scar and repair . Comment: There is a superficial perivascular  infiltrate with increased vascularity and  scattered siderophages.  Early granulation tissue  is present.  . After review of  the initial H&E slides,  immunohistochemical stains which are deemed  necessary for diagnostic purposes were performed  in Block-A2, with the following results: S100(-  in spindle cells), CD68(+ histiocytes) and P63(-  in spindle cells).  Appropriate controls were  used.    Patient is concerned because he continues to get lesions like this.  He has 1 lesion on his right gluteus.  He has 2 lesions in his left inguinal canal.  He states that each lesion will start as a small red bump with a whitehead.  It will gradually turn black and then into a scab however it will never go away.  He is concerned that there may be residual inflammation causing it to fester.  On exam today there are only an erythematous papule with a central scab on his right gluteus and 2 hyperpigmented erythematous macules in his left inguinal canal.  Each are roughly 6 mm in diameter.  He states that they do itch.  He does have a history of testing positive for staph in the past  Past Medical History:  Diagnosis Date   Allergy    Arthritis    Visualized in X-rays   Cataract    GERD (gastroesophageal reflux disease)    tums   Hearing loss    Heart murmur    History of kidney stones    Hyperlipidemia    Labral tear of shoulder    Lumbar spinal stenosis    Past Surgical History:  Procedure Laterality Date   BACK SURGERY  08/19/2020   COLONOSCOPY     EYE SURGERY  2000   lasic   HERNIA REPAIR     MOUTH SURGERY     teeth removed, dental implants placed   SPINE SURGERY  08/2020   L4-L5 FUSION   TONSILLECTOMY     VASECTOMY  1983   Current Outpatient Medications on File Prior to Visit  Medication Sig Dispense Refill   aspirin  EC 81 MG tablet Take 81 mg by mouth daily.     atorvastatin  (LIPITOR) 40 MG tablet TAKE  1 TABLET BY MOUTH DAILY 90 tablet 3   Cyanocobalamin  (B-12) 1000 MCG CAPS Take 1 tablet by mouth daily.     Multiple Vitamin (MULTIVITAMIN) capsule Take 1 capsule by mouth daily.     mupirocin  ointment  (BACTROBAN ) 2 % Apply 1 Application topically 2 (two) times daily. 22 g 3   pantoprazole  (PROTONIX ) 40 MG tablet Take 1 tablet (40 mg total) by mouth daily. 30 tablet 3   ezetimibe  (ZETIA ) 10 MG tablet Take 1 tablet (10 mg total) by mouth daily. 90 tablet 3   Current Facility-Administered Medications on File Prior to Visit  Medication Dose Route Frequency Provider Last Rate Last Admin   sodium chloride  flush (NS) 0.9 % injection 10 mL  10 mL Intravenous PRN Nishan, Peter C, MD   20 mL at 01/08/21 1535   Allergies  Allergen Reactions   Sulfa Antibiotics     Unknown reaction   Social History   Socioeconomic History   Marital status: Married    Spouse name: Not on file   Number of children: Not on file   Years of education: Not on file   Highest education level: Bachelor's degree (e.g., BA, AB, BS)  Occupational History   Not on file  Tobacco Use   Smoking status: Never   Smokeless tobacco: Never  Vaping Use   Vaping status: Never Used  Substance and Sexual Activity   Alcohol use: Yes    Alcohol/week: 9.0 standard drinks of alcohol    Types: 5 Glasses of wine, 4 Cans of beer per week   Drug use: No   Sexual activity: Yes    Birth control/protection: Surgical    Comment: vasectomy  Other Topics Concern   Not on file  Social History Narrative   Not on file   Social Drivers of Health   Financial Resource Strain: Low Risk  (04/28/2024)   Overall Financial Resource Strain (CARDIA)    Difficulty of Paying Living Expenses: Not hard at all  Food Insecurity: No Food Insecurity (04/28/2024)   Hunger Vital Sign    Worried About Running Out of Food in the Last Year: Never true    Ran Out of Food in the Last Year: Never true  Transportation Needs: No Transportation Needs (04/28/2024)   PRAPARE - Administrator, Civil Service (Medical): No    Lack of Transportation (Non-Medical): No  Physical Activity: Sufficiently Active (04/28/2024)   Exercise Vital Sign    Days of  Exercise per Week: 7 days    Minutes of Exercise per Session: 40 min  Stress: No Stress Concern Present (04/28/2024)   [employer] of Occupational Health - Occupational Stress Questionnaire    Feeling of Stress: Not at all  Social Connections: Moderately Isolated (04/28/2024)   Social Connection and Isolation Panel    Frequency of Communication with Friends and Family: Once a week    Frequency of Social Gatherings with Friends and Family: Once a week    Attends Religious Services: Never    Database administrator or Organizations: Yes    Attends Engineer, structural: More than 4 times per year    Marital Status: Married  Catering manager Violence: Not At Risk (06/04/2023)   Humiliation, Afraid, Rape, and Kick questionnaire    Fear of Current or Ex-Partner: No    Emotionally Abused: No    Physically Abused: No    Sexually Abused: No   Family History  Problem Relation Age of Onset  Cancer Mother        skin   Heart disease Mother    Vision loss Mother    Early death Father    Arthritis Maternal Grandmother    Cancer Maternal Grandmother    Hyperlipidemia Maternal Grandmother    Hypertension Maternal Grandmother    Cancer Maternal Grandfather    Heart disease Paternal Grandfather    Stroke Paternal Grandfather       Review of Systems  All other systems reviewed and are negative.      Objective:   Physical Exam Vitals reviewed.  Constitutional:      General: He is not in acute distress.    Appearance: He is well-developed. He is not diaphoretic.  HENT:     Head: Normocephalic and atraumatic.     Right Ear: External ear normal.     Left Ear: External ear normal.     Nose: Nose normal.     Mouth/Throat:     Pharynx: No oropharyngeal exudate.  Eyes:     General: No scleral icterus.       Right eye: No discharge.        Left eye: No discharge.     Conjunctiva/sclera: Conjunctivae normal.     Pupils: Pupils are equal, round, and reactive to light.   Neck:     Thyroid: No thyromegaly.     Vascular: Carotid bruit present. No JVD.     Trachea: No tracheal deviation.  Cardiovascular:     Rate and Rhythm: Normal rate and regular rhythm.     Heart sounds: Murmur heard.     No friction rub. No gallop.  Pulmonary:     Effort: Pulmonary effort is normal. No respiratory distress.     Breath sounds: Normal breath sounds. No wheezing or rales.  Chest:     Chest wall: No tenderness.  Abdominal:     General: Bowel sounds are normal. There is no distension.     Palpations: Abdomen is soft. There is no mass.     Tenderness: There is no abdominal tenderness. There is no guarding or rebound.  Musculoskeletal:     Cervical back: Normal range of motion and neck supple.  Lymphadenopathy:     Cervical: No cervical adenopathy.  Skin:    General: Skin is warm.     Coloration: Skin is not pale.     Findings: No erythema or rash.  Neurological:     Mental Status: He is alert and oriented to person, place, and time.     Cranial Nerves: No cranial nerve deficit.     Motor: No abnormal muscle tone.     Coordination: Coordination normal.     Deep Tendon Reflexes: Reflexes normal.  Psychiatric:        Behavior: Behavior normal.        Thought Content: Thought content normal.        Judgment: Judgment normal.   Please see the lesion as described in history of present illness     Assessment & Plan:  Folliculitis - Plan: WOUND CULTURE  I believe the patient may be colonized with Staph aureus and getting occasional isolated areas of folliculitis that then heal with scar tissue.  Recommend trying doxycycline  100 mg twice daily for 10 days to decrease bacterial colony counts all over the body to see if this will discontinue the cycle of recurrent pustules.  If not recommend dermatology consultation as biopsy was inconclusive.  I did remove the scab with a  razor blade and take a culture of the underlying tissue although there was no significant pus

## 2024-05-24 NOTE — Addendum Note (Signed)
 Addended by: ANGELENA RONAL SLATER MARLA on: 05/24/2024 02:47 PM   Modules accepted: Orders

## 2024-05-25 ENCOUNTER — Encounter: Payer: Self-pay | Admitting: Physician Assistant

## 2024-05-25 MED ORDER — EZETIMIBE 10 MG PO TABS: 10.0000 mg | ORAL_TABLET | Freq: Every day | ORAL | 0 refills | Status: DC

## 2024-05-26 ENCOUNTER — Ambulatory Visit: Payer: Self-pay | Admitting: Family Medicine

## 2024-05-28 LAB — WOUND CULTURE
MICRO NUMBER:: 17005223
RESULT:: NO GROWTH
SPECIMEN QUALITY:: ADEQUATE

## 2024-06-09 ENCOUNTER — Ambulatory Visit: Payer: BC Managed Care – PPO | Admitting: *Deleted

## 2024-06-09 VITALS — Ht 75.0 in | Wt 201.0 lb

## 2024-06-09 DIAGNOSIS — Z0000 Encounter for general adult medical examination without abnormal findings: Secondary | ICD-10-CM | POA: Diagnosis not present

## 2024-06-09 NOTE — Patient Instructions (Signed)
 [name] , Thank you for taking time to come for your Medicare Wellness Visit. I appreciate your ongoing commitment to your health goals. Please review the following plan we discussed and let me know if I can assist you in the future.   Screening recommendations/referrals: Colonoscopy: Cologuard  up to date Recommended yearly ophthalmology/optometry visit for glaucoma screening and checkup Recommended yearly dental visit for hygiene and checkup  Vaccinations: Influenza vaccine: up to date Pneumococcal vaccine: up to date Tdap vaccine: up to date Shingles vaccine: up to date       Preventive Care 65 Years and Older, Male Preventive care refers to lifestyle choices and visits with your health care provider that can promote health and wellness. What does preventive care include? A yearly physical exam. This is also called an annual well check. Dental exams once or twice a year. Routine eye exams. Ask your health care provider how often you should have your eyes checked. Personal lifestyle choices, including: Daily care of your teeth and gums. Regular physical activity. Eating a healthy diet. Avoiding tobacco and drug use. Limiting alcohol use. Practicing safe sex. Taking low doses of aspirin  every day. Taking vitamin and mineral supplements as recommended by your health care provider. What happens during an annual well check? The services and screenings done by your health care provider during your annual well check will depend on your age, overall health, lifestyle risk factors, and family history of disease. Counseling  Your health care provider may ask you questions about your: Alcohol use. Tobacco use. Drug use. Emotional well-being. Home and relationship well-being. Sexual activity. Eating habits. History of falls. Memory and ability to understand (cognition). Work and work Astronomer. Screening  You may have the following tests or measurements: Height, weight,  and BMI. Blood pressure. Lipid and cholesterol levels. These may be checked every 5 years, or more frequently if you are over 35 years old. Skin check. Lung cancer screening. You may have this screening every year starting at age 70 if you have a 30-pack-year history of smoking and currently smoke or have quit within the past 15 years. Fecal occult blood test (FOBT) of the stool. You may have this test every year starting at age 35. Flexible sigmoidoscopy or colonoscopy. You may have a sigmoidoscopy every 5 years or a colonoscopy every 10 years starting at age 93. Prostate cancer screening. Recommendations will vary depending on your family history and other risks. Hepatitis C blood test. Hepatitis B blood test. Sexually transmitted disease (STD) testing. Diabetes screening. This is done by checking your blood sugar (glucose) after you have not eaten for a while (fasting). You may have this done every 1-3 years. Abdominal aortic aneurysm (AAA) screening. You may need this if you are a current or former smoker. Osteoporosis. You may be screened starting at age 55 if you are at high risk. Talk with your health care provider about your test results, treatment options, and if necessary, the need for more tests. Vaccines  Your health care provider may recommend certain vaccines, such as: Influenza vaccine. This is recommended every year. Tetanus, diphtheria, and acellular pertussis (Tdap, Td) vaccine. You may need a Td booster every 10 years. Zoster vaccine. You may need this after age 19. Pneumococcal 13-valent conjugate (PCV13) vaccine. One dose is recommended after age 63. Pneumococcal polysaccharide (PPSV23) vaccine. One dose is recommended after age 8. Talk to your health care provider about which screenings and vaccines you need and how often you need them. This information  is not intended to replace advice given to you by your health care provider. Make sure you discuss any questions you  have with your health care provider. Document Released: 09/14/2015 Document Revised: 05/07/2016 Document Reviewed: 06/19/2015 Elsevier Interactive Patient Education  2017 [employer].  Fall Prevention in the Home Falls can cause injuries. They can happen to people of all ages. There are many things you can do to make your home safe and to help prevent falls. What can I do on the outside of my home? Regularly fix the edges of walkways and driveways and fix any cracks. Remove anything that might make you trip as you walk through a door, such as a raised step or threshold. Trim any bushes or trees on the path to your home. Use bright outdoor lighting. Clear any walking paths of anything that might make someone trip, such as rocks or tools. Regularly check to see if handrails are loose or broken. Make sure that both sides of any steps have handrails. Any raised decks and porches should have guardrails on the edges. Have any leaves, snow, or ice cleared regularly. Use sand or salt on walking paths during winter. Clean up any spills in your garage right away. This includes oil or grease spills. What can I do in the bathroom? Use night lights. Install grab bars by the toilet and in the tub and shower. Do not use towel bars as grab bars. Use non-skid mats or decals in the tub or shower. If you need to sit down in the shower, use a plastic, non-slip stool. Keep the floor dry. Clean up any water that spills on the floor as soon as it happens. Remove soap buildup in the tub or shower regularly. Attach bath mats securely with double-sided non-slip rug tape. Do not have throw rugs and other things on the floor that can make you trip. What can I do in the bedroom? Use night lights. Make sure that you have a light by your bed that is easy to reach. Do not use any sheets or blankets that are too big for your bed. They should not hang down onto the floor. Have a firm chair that has side arms. You can  use this for support while you get dressed. Do not have throw rugs and other things on the floor that can make you trip. What can I do in the kitchen? Clean up any spills right away. Avoid walking on wet floors. Keep items that you use a lot in easy-to-reach places. If you need to reach something above you, use a strong step stool that has a grab bar. Keep electrical cords out of the way. Do not use floor polish or wax that makes floors slippery. If you must use wax, use non-skid floor wax. Do not have throw rugs and other things on the floor that can make you trip. What can I do with my stairs? Do not leave any items on the stairs. Make sure that there are handrails on both sides of the stairs and use them. Fix handrails that are broken or loose. Make sure that handrails are as long as the stairways. Check any carpeting to make sure that it is firmly attached to the stairs. Fix any carpet that is loose or worn. Avoid having throw rugs at the top or bottom of the stairs. If you do have throw rugs, attach them to the floor with carpet tape. Make sure that you have a light switch at the top of  the stairs and the bottom of the stairs. If you do not have them, ask someone to add them for you. What else can I do to help prevent falls? Wear shoes that: Do not have high heels. Have rubber bottoms. Are comfortable and fit you well. Are closed at the toe. Do not wear sandals. If you use a stepladder: Make sure that it is fully opened. Do not climb a closed stepladder. Make sure that both sides of the stepladder are locked into place. Ask someone to hold it for you, if possible. Clearly mark and make sure that you can see: Any grab bars or handrails. First and last steps. Where the edge of each step is. Use tools that help you move around (mobility aids) if they are needed. These include: Canes. Walkers. Scooters. Crutches. Turn on the lights when you go into a dark area. Replace any light  bulbs as soon as they burn out. Set up your furniture so you have a clear path. Avoid moving your furniture around. If any of your floors are uneven, fix them. If there are any pets around you, be aware of where they are. Review your medicines with your doctor. Some medicines can make you feel dizzy. This can increase your chance of falling. Ask your doctor what other things that you can do to help prevent falls. This information is not intended to replace advice given to you by your health care provider. Make sure you discuss any questions you have with your health care provider. Document Released: 06/14/2009 Document Revised: 01/24/2016 Document Reviewed: 09/22/2014 Elsevier Interactive Patient Education  2017 [employer].

## 2024-06-09 NOTE — Progress Notes (Signed)
 Subjective:   Patrick Wall is a 73 y.o. male who presents for Medicare Annual/Subsequent preventive examination.  Visit Complete: Virtual I connected with  Patrick Wall on 06/09/24 by a audio enabled telemedicine application and verified that I am speaking with the correct person using two identifiers.  Patient Location: Home  Provider Location: Home Office  I discussed the limitations of evaluation and management by telemedicine. The patient expressed understanding and agreed to proceed.  Vital Signs: Because this visit was a virtual/telehealth visit, some criteria may be missing or patient reported. Any vitals not documented were not able to be obtained and vitals that have been documented are patient reported.  Patient Medicare AWV questionnaire was completed by the patient on 06-02-2024; I have confirmed that all information answered by patient is correct and no changes since this date.  Cardiac Risk Factors include: male gender;advanced age (>50men, >28 women);obesity (BMI >30kg/m2);family history of premature cardiovascular disease     Objective:    Today's Vitals   06/09/24 1056  Weight: 201 lb (91.2 kg)  Height: 6' 3 (1.905 m)  PainSc: 3    Body mass index is 25.12 kg/m.     06/09/2024   11:11 AM 12/08/2023    7:50 PM 06/04/2023   11:28 AM 05/02/2021    9:23 AM 02/15/2021   10:39 AM 08/17/2020    9:22 AM 09/23/2016    2:38 PM  Advanced Directives  Does Patient Have a Medical Advance Directive? No No No No No No No   Would patient like information on creating a medical advance directive? No - Patient declined  Yes (MAU/Ambulatory/Procedural Areas - Information given) Yes (MAU/Ambulatory/Procedural Areas - Information given) No - Patient declined No - Patient declined      Data saved with a previous flowsheet row definition    Current Medications (verified) Outpatient Encounter Medications as of 06/09/2024  Medication Sig   aspirin  EC 81 MG tablet Take 81 mg by mouth  daily.   atorvastatin  (LIPITOR) 40 MG tablet TAKE 1 TABLET BY MOUTH DAILY   Cyanocobalamin  (B-12) 1000 MCG CAPS Take 1 tablet by mouth daily.   ezetimibe  (ZETIA ) 10 MG tablet Take 1 tablet (10 mg total) by mouth daily.   Multiple Vitamin (MULTIVITAMIN) capsule Take 1 capsule by mouth daily.   mupirocin  ointment (BACTROBAN ) 2 % Apply 1 Application topically 2 (two) times daily.   pantoprazole  (PROTONIX ) 40 MG tablet Take 1 tablet (40 mg total) by mouth daily.   doxycycline  (VIBRA -TABS) 100 MG tablet Take 1 tablet (100 mg total) by mouth 2 (two) times daily.   Facility-Administered Encounter Medications as of 06/09/2024  Medication   sodium chloride  flush (NS) 0.9 % injection 10 mL    Allergies (verified) Sulfa antibiotics   History: Past Medical History:  Diagnosis Date   Allergy    Arthritis    Visualized in X-rays   Cataract    GERD (gastroesophageal reflux disease)    tums   Hearing loss    Heart murmur    History of kidney stones    Hyperlipidemia    Labral tear of shoulder    Lumbar spinal stenosis    Past Surgical History:  Procedure Laterality Date   BACK SURGERY  08/19/2020   COLONOSCOPY     EYE SURGERY  2000   lasic   HERNIA REPAIR     MOUTH SURGERY     teeth removed, dental implants placed   SPINE SURGERY  08/2020   L4-L5 FUSION  TONSILLECTOMY     VASECTOMY  1983   Family History  Problem Relation Age of Onset   Cancer Mother        skin   Heart disease Mother    Vision loss Mother    Early death Father    Arthritis Maternal Grandmother    Cancer Maternal Grandmother    Hyperlipidemia Maternal Grandmother    Hypertension Maternal Grandmother    Cancer Maternal Grandfather    Heart disease Paternal Grandfather    Stroke Paternal Grandfather    Social History   Socioeconomic History   Marital status: Married    Spouse name: Not on file   Number of children: Not on file   Years of education: Not on file   Highest education level: Bachelor's  degree (e.g., BA, AB, BS)  Occupational History   Not on file  Tobacco Use   Smoking status: Never   Smokeless tobacco: Never  Vaping Use   Vaping status: Never Used  Substance and Sexual Activity   Alcohol use: Yes    Alcohol/week: 9.0 standard drinks of alcohol    Types: 5 Glasses of wine, 4 Cans of beer per week   Drug use: No   Sexual activity: Yes    Birth control/protection: Surgical    Comment: vasectomy  Other Topics Concern   Not on file  Social History Narrative   Not on file   Social Drivers of Health   Financial Resource Strain: Low Risk  (06/09/2024)   Overall Financial Resource Strain (CARDIA)    Difficulty of Paying Living Expenses: Not hard at all  Food Insecurity: No Food Insecurity (06/09/2024)   Hunger Vital Sign    Worried About Running Out of Food in the Last Year: Never true    Ran Out of Food in the Last Year: Never true  Transportation Needs: No Transportation Needs (06/09/2024)   PRAPARE - Administrator, Civil Service (Medical): No    Lack of Transportation (Non-Medical): No  Physical Activity: Sufficiently Active (06/09/2024)   Exercise Vital Sign    Days of Exercise per Week: 7 days    Minutes of Exercise per Session: 40 min  Stress: No Stress Concern Present (06/09/2024)   [employer] of Occupational Health - Occupational Stress Questionnaire    Feeling of Stress: Not at all  Social Connections: Moderately Isolated (06/09/2024)   Social Connection and Isolation Panel    Frequency of Communication with Friends and Family: Once a week    Frequency of Social Gatherings with Friends and Family: Once a week    Attends Religious Services: Never    Database administrator or Organizations: Yes    Attends Engineer, structural: More than 4 times per year    Marital Status: Married    Tobacco Counseling Counseling given: Not Answered   Clinical Intake:  Pre-visit preparation completed: Yes  Pain : 0-10 Pain Score: 3   Pain Location: Knee Pain Orientation: Left, Right Pain Descriptors / Indicators: Burning, Dull, Aching Pain Onset: More than a month ago Pain Frequency: Constant     Diabetes: No  How often do you need to have someone help you when you read instructions, pamphlets, or other written materials from your doctor or pharmacy?: 1 - Never  Interpreter Needed?: No  Information entered by :: Mliss Graff LPN   Activities of Daily Living    06/09/2024   11:00 AM 06/02/2024   12:16 PM  In your present state  of health, do you have any difficulty performing the following activities:  Hearing? 1 1  Vision? 0 0  Difficulty concentrating or making decisions? 1 1  Walking or climbing stairs? 0 0  Dressing or bathing? 0 0  Doing errands, shopping? 0 0  Preparing Food and eating ? N N  Using the Toilet? N N  In the past six months, have you accidently leaked urine? N N  Do you have problems with loss of bowel control? N N  Managing your Medications? N N  Managing your Finances? N N  Housekeeping or managing your Housekeeping? N N    Patient Care Team: Duanne Butler DASEN, MD as PCP - General (Family Medicine) Delford Maude BROCKS, MD as PCP - Cardiology (Cardiology) Lita Lye, OD as Referring Physician (Optometry)  Indicate any recent Medical Services you may have received from other than [facility] providers in the past year (date may be approximate).     Assessment:   This is a routine wellness examination for Kevontae.  Hearing/Vision screen Hearing Screening - Comments:: Hearing aids Vision Screening - Comments:: Up to date   Goals Addressed             This Visit's Progress    Patient Stated       Stay healthy       Depression Screen    06/09/2024   11:01 AM 05/24/2024    2:47 PM 05/09/2024    3:00 PM 04/29/2024    2:30 PM 06/04/2023   11:26 AM 06/04/2023   11:25 AM 05/08/2023    8:52 AM  PHQ 2/9 Scores  PHQ - 2 Score 0 0 0 0 0 0 0  PHQ- 9 Score 3 0 0 1       Fall Risk     06/09/2024   10:59 AM 06/02/2024   12:16 PM 05/24/2024    2:47 PM 05/09/2024    3:00 PM 04/29/2024    2:30 PM  Fall Risk   Falls in the past year? 1 1 0 0 1  Number falls in past yr: 1 0 0 0 0  Injury with Fall? 1 1 0 0 1  Risk for fall due to : Impaired balance/gait  No Fall Risks No Fall Risks Other (Comment)  Follow up Falls evaluation completed;Education provided;Falls prevention discussed  Falls evaluation completed Falls evaluation completed Falls evaluation completed    MEDICARE RISK AT HOME: Medicare Risk at Home Any stairs in or around the home?: Yes If so, are there any without handrails?: No Home free of loose throw rugs in walkways, pet beds, electrical cords, etc?: Yes Adequate lighting in your home to reduce risk of falls?: Yes Life alert?: No Use of a cane, walker or w/c?: No Grab bars in the bathroom?: No Shower chair or bench in shower?: Yes Elevated toilet seat or a handicapped toilet?: No  TIMED UP AND GO:  Was the test performed?  No    Cognitive Function:        06/09/2024   10:57 AM 06/04/2023   11:35 AM  6CIT Screen  What Year? 0 points 0 points  What month? 0 points 0 points  What time? 0 points 0 points  Count back from 20 2 points 0 points  Months in reverse 0 points 0 points  Repeat phrase 2 points 0 points  Total Score 4 points 0 points    Immunizations Immunization History  Administered Date(s) Administered   Fluad Quad(high Dose 65+) 07/03/2020  INFLUENZA, HIGH DOSE SEASONAL PF 05/09/2024   Influenza Split 05/21/2015   Influenza, Seasonal, Injecte, Preservative Fre 05/08/2023   Influenza,inj,Quad PF,6+ Mos 05/02/2021   Influenza-Unspecified 06/02/2016, 05/24/2018, 05/27/2019, 05/06/2022   PFIZER Comirnaty(Gray Top)Covid-19 Tri-Sucrose Vaccine 01/15/2021   PFIZER(Purple Top)SARS-COV-2 Vaccination 09/03/2019, 09/23/2019, 05/27/2020   Pfizer(Comirnaty)Fall Seasonal Vaccine 12 years and older 05/06/2022, 06/08/2023   Pneumococcal  Conjugate-13 07/09/2017   Pneumococcal Polysaccharide-23 09/19/2014, 04/30/2020   Tdap 04/01/2014, 12/08/2023   Zoster Recombinant(Shingrix ) 05/08/2023, 07/20/2023   Zoster, Live 03/07/2014    TDAP status: Up to date  Flu Vaccine status: Up to date  Pneumococcal vaccine status: Up to date  Covid-19 vaccine status: Completed vaccines  Qualifies for Shingles Vaccine? No   Zostavax completed Yes   Shingrix  Completed?: Yes  Screening Tests Health Maintenance  Topic Date Due   COVID-19 Vaccine (7 - 2025-26 season) 05/02/2024   Fecal DNA (Cologuard)  06/08/2025   DTaP/Tdap/Td (3 - Td or Tdap) 12/07/2033   Pneumococcal Vaccine: 50+ Years  Completed   Influenza Vaccine  Completed   Hepatitis C Screening  Completed   Zoster Vaccines- Shingrix   Completed   Meningococcal B Vaccine  Aged Out   Colonoscopy  Discontinued    Health Maintenance  Health Maintenance Due  Topic Date Due   COVID-19 Vaccine (7 - 2025-26 season) 05/02/2024    Colorectal cancer screening: Type of screening: Cologuard. Completed 2023. Repeat every 3 years  Lung Cancer Screening: (Low Dose CT Chest recommended if Age 62-80 years, 20 pack-year currently smoking OR have quit w/in 15years.) does not qualify.   Lung Cancer Screening Referral:   Additional Screening:  Hepatitis C Screening: does not qualify; Completed 2018  Vision Screening: Recommended annual ophthalmology exams for early detection of glaucoma and other disorders of the eye. Is the patient up to date with their annual eye exam?  Yes  Who is the provider or what is the name of the office in which the patient attends annual eye exams? Unsure of name If pt is not established with a provider, would they like to be referred to a provider to establish care? No .   Dental Screening: Recommended annual dental exams for proper oral hygiene   Community Resource Referral / Chronic Care Management: CRR required this visit?  No   CCM required this  visit?  No     Plan:     I have personally reviewed and noted the following in the patient's chart:   Medical and social history Use of alcohol, tobacco or illicit drugs  Current medications and supplements including opioid prescriptions. Patient is not currently taking opioid prescriptions. Functional ability and status Nutritional status Physical activity Advanced directives List of other physicians Hospitalizations, surgeries, and ER visits in previous 12 months Vitals Screenings to include cognitive, depression, and falls Referrals and appointments  In addition, I have reviewed and discussed with patient certain preventive protocols, quality metrics, and best practice recommendations. A written personalized care plan for preventive services as well as general preventive health recommendations were provided to patient.     Mliss Graff, LPN   [id number]   After Visit Summary: (MyChart) Due to this being a telephonic visit, the after visit summary with patients personalized plan was offered to patient via MyChart   Nurse Notes:

## 2024-07-05 ENCOUNTER — Encounter: Payer: Self-pay | Admitting: Physician Assistant

## 2024-07-13 ENCOUNTER — Telehealth: Payer: Self-pay

## 2024-07-13 DIAGNOSIS — I2510 Atherosclerotic heart disease of native coronary artery without angina pectoris: Secondary | ICD-10-CM

## 2024-07-13 DIAGNOSIS — Z79899 Other long term (current) drug therapy: Secondary | ICD-10-CM

## 2024-07-13 NOTE — Telephone Encounter (Signed)
 Patient sent a mychart message requesting routine labs before next appointment. Labs have been ordered and lab slips will be mailed to the patient.

## 2024-08-05 NOTE — Progress Notes (Unsigned)
 Cardiology Office Note:  .   Date:  08/09/2024  ID:  Patrick Wall, DOB 05/19/1951, MRN [id number] PCP: Duanne Wall DASEN, MD  [facility] Providers Cardiologist:  Maude Emmer, MD { APP: Orren Fabry, PA-C  History of Present Illness: .   Patrick Wall is a 73 y.o. male with a past medical history of GERD, hyperlipidemia here for follow-up appointment.  Paternal aunt died at 68 from an MI.  Strong family history.  Father died suddenly in his 30s after snorkeling.  Patient himself is active competing in triathlons.  No cardiac symptoms.  Was taking a statin.  Does triathlons when he runs first mile which is hard then feels better.  Cath 2007 for abnormal Holter and PVCs which was normal.  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  He is married and works in the lab.  3 children and 2 in [place] and 1 in [place] .  Calcium  score 09/02/2018 which was 424.  Myoview  09/17/2018 was normal no ischemia.  LVEF 59%.  TTE 06/26/2022 with LVEF 65%, mild to moderate AAS and mean gradient 19 mmHg, peak 35.3 mmHg, AVA 1.5 to centimeter squared, DVI 0.42.  Gradients is same as TTE done 07/15/2021.  Aortic root measured 4.3 cm.  Had AL 4 through 5 fusion done 08/20/2020.  Active but limited by back pain.  Did his first do [place] at [facility] March 2021.  Slowing down.  Son is in [place]  still.  Discussed doing cardiac CTA a to rule out CAD progression and assess amounts of calcium  on AV but was concerned about multiple bills he would get from [facility].  Also endorsed some mental fog with no objective evidence of TIA/CVA.  Seen by primary Dr. Duanne and patient deferred MRI.  I saw him 07/2023, he is here for his annual visit with a history of heart disease, presents with intermittent episodes of vision loss while driving. The episodes, described as eyes going out of focus, resolve spontaneously within 5-10 minutes. 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 He also reports knee pain, which they believe may be due to calcific tendonitis. The patient has a history of bilateral knee surgery and is currently experiencing difficulty with running due to the pain. He also mentions a history of aortic stenosis and a high calcium  score from a test conducted four years ago. The patient is interested in exploring the potential benefits of Vitamin K2 supplementation for reducing arterial calcification. The patient is currently taking Vitamin D3 supplements and is considering a combination pill of Vitamin K2 and D3.  Reports no shortness of breath nor dyspnea on exertion. Reports no chest pain, pressure, or tightness. No edema, orthopnea, PND. Reports no palpitations.   Discussed the use of AI scribe software for clinical note transcription with the patient, who gave verbal consent to proceed.  Today, he presents with a hx of aortic stenosis with concerns about heart palpitations.  He had a single brief episode last week of a sensation that his heart was "skipping a beat." It has not recurred. He has no ongoing palpitations, chest pain, or shortness of breath. He expected a follow-up echocardiogram before this visit.  He has aortic stenosis and takes Lipitor and ezetimibe . A coronary calcium  score last year was high.  He is physically active with running and cycling and continues to run about twice a week for three  miles despite knee discomfort.  He has chronic back and hip pain after prior spinal fusion but these symptoms are positional and not exertional.  Reports no shortness of breath nor dyspnea on exertion. Reports no chest pain, pressure, or tightness. No edema, orthopnea, PND. Reports no palpitations.   Discussed the use of AI scribe software for clinical note transcription with the patient, who gave verbal consent to proceed.  ROS: pertinent ROS in HPI  Studies Reviewed: .       CT  cardiac scoring 09/02/2018 EXAM: Coronary Calcium  Score   TECHNIQUE: The patient was scanned on a Siemens Somatom 64 slice scanner. Axial non-contrast 3 mm slices were carried out through the heart. The data set was analyzed on a dedicated work station and scored using the Agatson method.   FINDINGS: Non-cardiac: See separate report from [facility].   Ascending aorta: Mildly dilated 3.9 cm   Pericardium: Normal   Coronary arteries: 3 vessel coronary calcium  noted   IMPRESSION: Coronary calcium  score of 424. This was 67 th percentile for age and sex matched control.      Physical Exam:   VS:  BP (!) 140/74   Pulse 64   Ht 6' 3 (1.905 m)   Wt 207 lb (93.9 kg)   SpO2 99%   BMI 25.87 kg/m    Wt Readings from Last 3 Encounters:  08/09/24 207 lb (93.9 kg)  06/09/24 201 lb (91.2 kg)  05/24/24 201 lb (91.2 kg)    GEN: Well nourished, well developed in no acute distress NECK: No JVD; No carotid bruits CARDIAC: RRR, + holosystolic murmur which radiates to the carotids, rubs, gallops RESPIRATORY:  Clear to auscultation without rales, wheezing or rhonchi  ABDOMEN: Soft, non-tender, non-distended EXTREMITIES:  No edema; No deformity   ASSESSMENT AND PLAN: .    Aortic valve stenosis Holosystolic murmur radiating to carotids. No significant progression. Active lifestyle maintained. - Scheduled annual echocardiogram to assess aortic valve status. - Review echocardiogram results via MyChart and follow up if necessary.  Coronary artery disease High calcium  score. Managed with Lipitor and ezetimibe . No new symptoms. Risk modification ongoing. - Continue Lipitor and ezetimibe  for LDL management.  Mixed hyperlipidemia LDL well-controlled at 63 mg/dL. No new lipid concerns. - Continue current lipid-lowering therapy with Lipitor and ezetimibe .  Coronary Artery Calcification   Moderate coronary artery calcification noted on calcium  score from 2020. Patient interested in  potential benefits of Vitamin K2 supplementation for reducing arterial calcification.   -calcium  score reviewed with the patient  -goal LDL < 55   Dispo: He can follow-up in a year with me or Dr. Delford  Signed, Orren LOISE Fabry, PA-C

## 2024-08-09 ENCOUNTER — Encounter: Payer: Self-pay | Admitting: Physician Assistant

## 2024-08-09 ENCOUNTER — Ambulatory Visit: Attending: Physician Assistant | Admitting: Physician Assistant

## 2024-08-09 VITALS — BP 140/74 | HR 64 | Ht 75.0 in | Wt 207.0 lb

## 2024-08-09 DIAGNOSIS — I2510 Atherosclerotic heart disease of native coronary artery without angina pectoris: Secondary | ICD-10-CM | POA: Diagnosis not present

## 2024-08-09 DIAGNOSIS — M549 Dorsalgia, unspecified: Secondary | ICD-10-CM

## 2024-08-09 DIAGNOSIS — E782 Mixed hyperlipidemia: Secondary | ICD-10-CM

## 2024-08-09 DIAGNOSIS — Q2112 Patent foramen ovale: Secondary | ICD-10-CM

## 2024-08-09 DIAGNOSIS — I350 Nonrheumatic aortic (valve) stenosis: Secondary | ICD-10-CM

## 2024-08-09 DIAGNOSIS — I77819 Aortic ectasia, unspecified site: Secondary | ICD-10-CM

## 2024-08-09 DIAGNOSIS — Z79899 Other long term (current) drug therapy: Secondary | ICD-10-CM

## 2024-08-09 MED ORDER — EZETIMIBE 10 MG PO TABS: 10.0000 mg | ORAL_TABLET | Freq: Every day | ORAL | 3 refills | Status: DC

## 2024-08-09 MED ORDER — EZETIMIBE 10 MG PO TABS: 10.0000 mg | ORAL_TABLET | Freq: Every day | ORAL | 3 refills | Status: AC

## 2024-08-09 MED ORDER — ATORVASTATIN CALCIUM 40 MG PO TABS: 40.0000 mg | ORAL_TABLET | Freq: Every day | ORAL | 3 refills | Status: AC

## 2024-08-09 MED ORDER — ATORVASTATIN CALCIUM 40 MG PO TABS: 40.0000 mg | ORAL_TABLET | Freq: Every day | ORAL | 3 refills | Status: DC

## 2024-08-09 NOTE — Patient Instructions (Signed)
 Medication Instructions:  Your physician recommends that you continue on your current medications as directed. Please refer to the Current Medication list given to you today.  *If you need a refill on your cardiac medications before your next appointment, please call your pharmacy*  Lab Work: NONE ORDERED If you have labs (blood work) drawn today and your tests are completely normal, you will receive your results only by: MyChart Message (if you have MyChart) OR A paper copy in the mail If you have any lab test that is abnormal or we need to change your treatment, we will call you to review the results.  Testing/Procedures: Your physician has requested that you have an echocardiogram. Echocardiography is a painless test that uses sound waves to create images of your heart. It provides your doctor with information about the size and shape of your heart and how well your heart's chambers and valves are working. This procedure takes approximately one hour. There are no restrictions for this procedure. Please do NOT wear cologne, perfume, aftershave, or lotions (deodorant is allowed). Please arrive 15 minutes prior to your appointment time.  Please note: We ask at that you not bring children with you during ultrasound (echo/ vascular) testing. Due to room size and safety concerns, children are not allowed in the ultrasound rooms during exams. Our front office staff cannot provide observation of children in our lobby area while testing is being conducted. An adult accompanying a patient to their appointment will only be allowed in the ultrasound room at the discretion of the ultrasound technician under special circumstances. We apologize for any inconvenience.   Follow-Up: At [facility], you and your health needs are our priority.  As part of our continuing mission to provide you with exceptional heart care, our providers are all part of one team.  This team includes your primary  Cardiologist (physician) and Advanced Practice Providers or APPs (Physician Assistants and Nurse Practitioners) who all work together to provide you with the care you need, when you need it.  Your next appointment:   1 year(s)  Provider:   Dr. Jeffrie or Orren Fabry, PA   We recommend signing up for the patient portal called MyChart.  Sign up information is provided on this After Visit Summary.  MyChart is used to connect with patients for Virtual Visits (Telemedicine).  Patients are able to view lab/test results, encounter notes, upcoming appointments, etc.  Non-urgent messages can be sent to your provider as well.   To learn more about what you can do with MyChart, go to [email or web address].

## 2024-08-21 ENCOUNTER — Other Ambulatory Visit: Payer: Self-pay | Admitting: Family Medicine

## 2024-09-16 ENCOUNTER — Ambulatory Visit (HOSPITAL_COMMUNITY)
Admission: RE | Admit: 2024-09-16 | Discharge: 2024-09-16 | Disposition: A | Discharge status: Home / Self Care | Source: Ambulatory Visit | Attending: Vascular Surgery | Admitting: Vascular Surgery

## 2024-09-16 DIAGNOSIS — I77819 Aortic ectasia, unspecified site: Secondary | ICD-10-CM | POA: Insufficient documentation

## 2024-09-16 DIAGNOSIS — E782 Mixed hyperlipidemia: Secondary | ICD-10-CM | POA: Insufficient documentation

## 2024-09-16 DIAGNOSIS — M549 Dorsalgia, unspecified: Secondary | ICD-10-CM | POA: Diagnosis present

## 2024-09-16 DIAGNOSIS — Z79899 Other long term (current) drug therapy: Secondary | ICD-10-CM | POA: Insufficient documentation

## 2024-09-16 DIAGNOSIS — I350 Nonrheumatic aortic (valve) stenosis: Secondary | ICD-10-CM | POA: Insufficient documentation

## 2024-09-16 DIAGNOSIS — I2510 Atherosclerotic heart disease of native coronary artery without angina pectoris: Secondary | ICD-10-CM | POA: Insufficient documentation

## 2024-09-16 DIAGNOSIS — Q2112 Patent foramen ovale: Secondary | ICD-10-CM | POA: Insufficient documentation

## 2024-09-17 LAB — ECHOCARDIOGRAM COMPLETE
AR max vel: 1.06 cm2
AV Area VTI: 1.1 cm2
AV Area mean vel: 1.04 cm2
AV Mean grad: 23.8 mmHg
AV Peak grad: 42.1 mmHg
Ao pk vel: 3.24 m/s
Area-P 1/2: 4.21 cm2
S' Lateral: 3.18 cm

## 2024-09-19 ENCOUNTER — Ambulatory Visit: Payer: Self-pay | Admitting: Physician Assistant

## 2024-09-21 NOTE — Telephone Encounter (Signed)
 LVM asking pt to call our office if he has any questions about his echo. Pt read results via MyChart on 1/19.

## 2025-06-15 ENCOUNTER — Ambulatory Visit
